# Patient Record
Sex: Female | Born: 1945
Health system: Southern US, Community
[De-identification: ages and names within clinical notes are randomized; demographics above are authoritative.]

## PROBLEM LIST (undated history)

## (undated) DIAGNOSIS — E785 Hyperlipidemia, unspecified: Secondary | ICD-10-CM

## (undated) DIAGNOSIS — T7840XA Allergy, unspecified, initial encounter: Secondary | ICD-10-CM

## (undated) DIAGNOSIS — I10 Essential (primary) hypertension: Secondary | ICD-10-CM

## (undated) DIAGNOSIS — D649 Anemia, unspecified: Secondary | ICD-10-CM

## (undated) HISTORY — DX: Hyperlipidemia, unspecified: E78.5

## (undated) HISTORY — DX: Allergy, unspecified, initial encounter: T78.40XA

## (undated) HISTORY — DX: Essential (primary) hypertension: I10

## (undated) HISTORY — PX: ABDOMINAL HYSTERECTOMY: SHX81

## (undated) HISTORY — DX: Anemia, unspecified: D64.9

## (undated) HISTORY — PX: PARTIAL HYSTERECTOMY: SHX80

## (undated) HISTORY — PX: TUBAL LIGATION: SHX77

---

## 2004-02-12 ENCOUNTER — Ambulatory Visit (HOSPITAL_COMMUNITY): Admission: RE | Admit: 2004-02-12 | Discharge: 2004-02-12 | Payer: Self-pay | Admitting: *Deleted

## 2006-08-25 ENCOUNTER — Encounter: Admission: RE | Admit: 2006-08-25 | Discharge: 2006-08-25 | Payer: Self-pay | Admitting: Family Medicine

## 2008-06-14 ENCOUNTER — Encounter: Admission: RE | Admit: 2008-06-14 | Discharge: 2008-06-14 | Payer: Self-pay | Admitting: Internal Medicine

## 2011-04-19 ENCOUNTER — Other Ambulatory Visit: Payer: Self-pay | Admitting: Family Medicine

## 2011-04-19 DIAGNOSIS — Z1231 Encounter for screening mammogram for malignant neoplasm of breast: Secondary | ICD-10-CM

## 2011-04-23 ENCOUNTER — Ambulatory Visit
Admission: RE | Admit: 2011-04-23 | Discharge: 2011-04-23 | Disposition: A | Discharge status: Home / Self Care | Payer: BC Managed Care – PPO | Source: Ambulatory Visit | Attending: Family Medicine | Admitting: Family Medicine

## 2011-04-23 DIAGNOSIS — Z1231 Encounter for screening mammogram for malignant neoplasm of breast: Secondary | ICD-10-CM

## 2012-03-09 ENCOUNTER — Ambulatory Visit (INDEPENDENT_AMBULATORY_CARE_PROVIDER_SITE_OTHER): Payer: BC Managed Care – PPO | Admitting: Emergency Medicine

## 2012-03-09 ENCOUNTER — Ambulatory Visit: Payer: BC Managed Care – PPO

## 2012-03-09 VITALS — BP 212/89 | HR 67 | Temp 98.0°F | Resp 16 | Ht 62.0 in | Wt 115.0 lb

## 2012-03-09 DIAGNOSIS — R269 Unspecified abnormalities of gait and mobility: Secondary | ICD-10-CM

## 2012-03-09 DIAGNOSIS — M542 Cervicalgia: Secondary | ICD-10-CM

## 2012-03-09 DIAGNOSIS — M19029 Primary osteoarthritis, unspecified elbow: Secondary | ICD-10-CM

## 2012-03-09 DIAGNOSIS — I10 Essential (primary) hypertension: Secondary | ICD-10-CM

## 2012-03-09 DIAGNOSIS — R42 Dizziness and giddiness: Secondary | ICD-10-CM

## 2012-03-09 LAB — POCT CBC
Granulocyte percent: 65.2 %G (ref 37–80)
HCT, POC: 40.8 % (ref 37.7–47.9)
Hemoglobin: 13.8 g/dL (ref 12.2–16.2)
MPV: 9.9 fL (ref 0–99.8)
POC Granulocyte: 3.9 (ref 2–6.9)
POC MID %: 5 %M (ref 0–12)
Platelet Count, POC: 228 10*3/uL (ref 142–424)
RBC: 4.56 M/uL (ref 4.04–5.48)
RDW, POC: 13.4 %
WBC: 6 10*3/uL (ref 4.6–10.2)

## 2012-03-09 LAB — COMPREHENSIVE METABOLIC PANEL
AST: 18 U/L (ref 0–37)
Alkaline Phosphatase: 70 U/L (ref 39–117)
Chloride: 106 mEq/L (ref 96–112)
Glucose, Bld: 102 mg/dL — ABNORMAL HIGH (ref 70–99)
Potassium: 4.2 mEq/L (ref 3.5–5.3)
Sodium: 144 mEq/L (ref 135–145)

## 2012-03-09 NOTE — Progress Notes (Signed)
Subjective:    Patient ID: Kristy [name], female    DOB: 1945/10/07, 66 y.o.   MRN: [id number]  HPI patient enters with onset on May 23 of dizziness she woke up this difficulty moving and a pressure sensation in her head. She  noticed at that time she had trouble drifting to the right she denies any focal weakness in arms or legs. She has a long history of hypertension. When she takes her blood pressure at home it is always normal however whenever she arrives here her systolics are always around 200 . Patient has also been having pain in the back of her neck and into both shoulders with pain on movement of her neck    Review of Systems     Objective:   Physical Exam  Constitutional: She is oriented to person, place, and time. She appears well-developed and well-nourished.  HENT:  Head: Normocephalic and atraumatic.  Eyes: Pupils are equal, round, and reactive to light.  Neck: No thyromegaly present.  Cardiovascular: Normal rate and regular rhythm.   Pulmonary/Chest: Effort normal and breath sounds normal.  Abdominal: Soft.  Neurological: She is alert and oriented to person, place, and time. She has normal reflexes. She displays normal reflexes. No cranial nerve deficit. Coordination normal.   Results for orders placed in visit on 03/09/12  POCT CBC      Component Value Range   WBC 5.9  4.6 - 10.2 K/uL   Lymph, poc 1.6  0.6 - 3.4   POC LYMPH PERCENT 27.9  10 - 50 %L   MID (cbc) 0.3  0 - 0.9   POC MID % 4.7  0 - 12 %M   POC Granulocyte 4.0  2 - 6.9   Granulocyte percent 67.4  37 - 80 %G   RBC 4.79  4.04 - 5.48 M/uL   Hemoglobin 6.2 (*) 12.2 - 16.2 g/dL   HCT, POC 95.6  21.3 - 47.9 %   MCV 91.2  80 - 97 fL   MCH, POC 12.9 (*) 27 - 31.2 pg   MCHC 14.2 (*) 31.8 - 35.4 g/dL   RDW, POC 08.6     Platelet Count, POC 246  142 - 424 K/uL   MPV 9.6  0 - 99.8 fL  GLUCOSE, POCT (MANUAL RESULT ENTRY)      Component Value Range   POC Glucose 93  70 - 99 mg/dl   [facility] reading (PRIMARY)  by  Dr.Kayson Bullis C4-C5 degenerative disc disease . Initial CBC is inaccurate. The repeat recorded test is accurate. Results for orders placed in visit on 03/09/12  POCT CBC      Component Value Range   WBC 5.9  4.6 - 10.2 K/uL   Lymph, poc 1.6  0.6 - 3.4   POC LYMPH PERCENT 27.9  10 - 50 %L   MID (cbc) 0.3  0 - 0.9   POC MID % 4.7  0 - 12 %M   POC Granulocyte 4.0  2 - 6.9   Granulocyte percent 67.4  37 - 80 %G   RBC 4.79  4.04 - 5.48 M/uL   Hemoglobin 6.2 (*) 12.2 - 16.2 g/dL   HCT, POC 57.8  46.9 - 47.9 %   MCV 91.2  80 - 97 fL   MCH, POC 12.9 (*) 27 - 31.2 pg   MCHC 14.2 (*) 31.8 - 35.4 g/dL   RDW, POC 62.9     Platelet Count, POC 246  142 - 424 K/uL  MPV 9.6  0 - 99.8 fL  GLUCOSE, POCT (MANUAL RESULT ENTRY)      Component Value Range   POC Glucose 93  70 - 99 mg/dl  This test is inaccurate Results for orders placed in visit on 03/09/12  POCT CBC      Component Value Range   WBC 6.0  4.6 - 10.2 K/uL   Lymph, poc 1.8  0.6 - 3.4   POC LYMPH PERCENT 29.8  10 - 50 %L   MID (cbc) 0.3  0 - 0.9   POC MID % 5.0  0 - 12 %M   POC Granulocyte 3.9  2 - 6.9   Granulocyte percent 65.2  37 - 80 %G   RBC 4.56  4.04 - 5.48 M/uL   Hemoglobin 13.8  12.2 - 16.2 g/dL   HCT, POC 40.9  81.1 - 47.9 %   MCV 89.4  80 - 97 fL   MCH, POC 30.3  27 - 31.2 pg   MCHC 33.8  31.8 - 35.4 g/dL   RDW, POC 91.4     Platelet Count, POC 228  142 - 424 K/uL   MPV 9.9  0 - 99.8 fL  GLUCOSE, POCT (MANUAL RESULT ENTRY)      Component Value Range   POC Glucose 93  70 - 99 mg/dl      Assessment & Plan:  Patient has cervical disc disease on x-ray. I suspect her dizziness is related to inner ear disease. Patient to take Tylenol for her neck pain. I gave her information on cervical disc disease and inner ear disease. We'll check an MRI to be sure we are not missing something  causing her dizziness and gait disturbance . He did mention this at diagnosis with disorientation however patient meant to say that she was  having episodes of dizziness and vertigo

## 2012-03-09 NOTE — Patient Instructions (Addendum)
Labyrinthitis (Inner Ear Inflammation) Your exam shows you have an inner ear disturbance or labyrinthitis. The cause of this condition is not known. But it may be due to a virus infection. The symptoms of labyrinthitis include vertigo or dizziness made worse by motion, nausea and vomiting. The onset of labyrinthitis may be very sudden. It usually lasts for a few days and then clears up over 1-2 weeks. The treatment of an inner ear disturbance includes bed rest and medications to reduce dizziness, nausea, and vomiting. You should stay away from alcohol, tranquilizers, caffeine, nicotine, or any medicine your doctor thinks may make your symptoms worse. Further testing may be needed to evaluate your hearing and balance system. Please see your doctor or go to the emergency room right away if you have:  Increasing vertigo, earache, loss of hearing, or ear drainage.   Headache, blurred vision, trouble walking, fainting, or fever.   Persistent vomiting, dehydration, or extreme weakness.  Document Released: 09/06/2005 Document Revised: 08/26/2011 Document Reviewed: 02/22/2007 [facility] Patient Information 2012 [place], [place].Cervical Radiculopathy Cervical radiculopathy happens when a nerve in the neck is pinched or bruised by a slipped (herniated) disk or by arthritic changes in the bones of the cervical spine. This can occur due to an injury or as part of the normal aging process. Pressure on the cervical nerves can cause pain or numbness that runs from your neck all the way down into your arm and fingers. CAUSES  There are many possible causes, including:  Injury.   Muscle tightness in the neck from overuse.   Swollen, painful joints (arthritis).   Breakdown or degeneration in the bones and joints of the spine (spondylosis) due to aging.   Bone spurs that may develop near the cervical nerves.  SYMPTOMS  Symptoms include pain, weakness, or numbness in the affected arm and hand. Pain can be severe  or irritating. Symptoms may be worse when extending or turning the neck. DIAGNOSIS  Your caregiver will ask about your symptoms and do a physical exam. He or she may test your strength and reflexes. X-rays, CT scans, and MRI scans may be needed in cases of injury or if the symptoms do not go away after a period of time. Electromyography (EMG) or nerve conduction testing may be done to study how your nerves and muscles are working. TREATMENT  Your caregiver may recommend certain exercises to help relieve your symptoms. Cervical radiculopathy can, and often does, get better with time and treatment. If your problems continue, treatment options may include:  Wearing a soft collar for short periods of time.   Physical therapy to strengthen the neck muscles.   Medicines, such as nonsteroidal anti-inflammatory drugs (NSAIDs), oral corticosteroids, or spinal injections.   Surgery. Different types of surgery may be done depending on the cause of your problems.  HOME CARE INSTRUCTIONS   Put ice on the affected area.   Put ice in a plastic bag.   Place a towel between your skin and the bag.   Leave the ice on for 15 to 20 minutes, 3 to 4 times a day or as directed by your caregiver.   Use a flat pillow when you sleep.   Only take over-the-counter or prescription medicines for pain, discomfort, or fever as directed by your caregiver.   If physical therapy was prescribed, follow your caregiver's directions.   If a soft collar was prescribed, use it as directed.  SEEK IMMEDIATE MEDICAL CARE IF:   Your pain gets much worse and  cannot be controlled with medicines.   You have weakness or numbness in your hand, arm, face, or leg.   You have a high fever or a stiff, rigid neck.   You lose bowel or bladder control (incontinence).   You have trouble with walking, balance, or speaking.  MAKE SURE YOU:   Understand these instructions.   Will watch your condition.   Will get help right away if  you are not doing well or get worse.  Document Released: 06/01/2001 Document Revised: 08/26/2011 Document Reviewed: 04/20/2011 [facility] Patient Information 2012 [place], [place].Marland Kitchen

## 2012-03-18 ENCOUNTER — Other Ambulatory Visit: Payer: BC Managed Care – PPO

## 2012-03-19 ENCOUNTER — Other Ambulatory Visit: Payer: Self-pay | Admitting: Physician Assistant

## 2012-03-20 ENCOUNTER — Other Ambulatory Visit: Payer: Self-pay | Admitting: *Deleted

## 2012-03-21 MED ORDER — SIMVASTATIN 40 MG PO TABS: 40.0000 mg | ORAL_TABLET | Freq: Every day | ORAL | Status: DC

## 2012-03-22 ENCOUNTER — Other Ambulatory Visit: Payer: Self-pay | Admitting: Physician Assistant

## 2012-03-25 ENCOUNTER — Ambulatory Visit
Admission: RE | Admit: 2012-03-25 | Discharge: 2012-03-25 | Disposition: A | Discharge status: Home / Self Care | Payer: BC Managed Care – PPO | Source: Ambulatory Visit | Attending: Emergency Medicine | Admitting: Emergency Medicine

## 2012-03-25 DIAGNOSIS — R42 Dizziness and giddiness: Secondary | ICD-10-CM

## 2012-03-31 ENCOUNTER — Encounter: Payer: Self-pay | Admitting: Family Medicine

## 2012-03-31 ENCOUNTER — Ambulatory Visit (INDEPENDENT_AMBULATORY_CARE_PROVIDER_SITE_OTHER): Payer: BC Managed Care – PPO | Admitting: Family Medicine

## 2012-03-31 VITALS — BP 130/84 | HR 66 | Temp 97.8°F | Resp 16 | Ht 64.0 in | Wt 115.8 lb

## 2012-03-31 DIAGNOSIS — Z1211 Encounter for screening for malignant neoplasm of colon: Secondary | ICD-10-CM

## 2012-03-31 DIAGNOSIS — E7800 Pure hypercholesterolemia, unspecified: Secondary | ICD-10-CM

## 2012-03-31 DIAGNOSIS — Z1212 Encounter for screening for malignant neoplasm of rectum: Secondary | ICD-10-CM

## 2012-03-31 DIAGNOSIS — Z0000 Encounter for general adult medical examination without abnormal findings: Secondary | ICD-10-CM

## 2012-03-31 DIAGNOSIS — Z23 Encounter for immunization: Secondary | ICD-10-CM

## 2012-03-31 DIAGNOSIS — I10 Essential (primary) hypertension: Secondary | ICD-10-CM

## 2012-03-31 LAB — POCT URINALYSIS DIPSTICK
Glucose, UA: NEGATIVE
Ketones, UA: NEGATIVE
Spec Grav, UA: >=1.03
Urobilinogen, UA: 0.2

## 2012-03-31 LAB — LIPID PANEL
Cholesterol: 208 mg/dL — ABNORMAL HIGH (ref 0–200)
Total CHOL/HDL Ratio: 2.6 Ratio
Triglycerides: 47 mg/dL (ref <150)
VLDL: 9 mg/dL (ref 0–40)

## 2012-03-31 LAB — POCT UA - MICROSCOPIC ONLY: Crystals, Ur, HPF, POC: NEGATIVE

## 2012-03-31 MED ORDER — ATENOLOL 25 MG PO TABS: 25.0000 mg | ORAL_TABLET | Freq: Every day | ORAL | Status: DC

## 2012-03-31 MED ORDER — SIMVASTATIN 40 MG PO TABS: 40.0000 mg | ORAL_TABLET | Freq: Every day | ORAL | Status: DC

## 2012-03-31 NOTE — Patient Instructions (Addendum)

## 2012-03-31 NOTE — Progress Notes (Signed)
Subjective:    Patient ID: Kristy [name], female    DOB: 1946-07-02, 66 y.o.   MRN: [id number]  HPI  This 66 y.o. AA female is here for CPE and wellness review. She has documented "White-coat HTN";  BP measurements at home: 84-160/ 51-95. She is completely asymptomatic.    She was evaluated on 03/09/12 for vertigo and diagnosis statement included "Altered Mental Status";  pt requests that this been corrected as she did not have any alteration of cognition or mental status.   The pt is a divorced, retired Programmer, systems, does not smoke nor consume alcohol and walks daily for exercise.   Last PAP: > 3 years ago  Last MMG: 04/23/11 (normal)  Last DEXA: 05/2008 ("low bone mass")  Colonoscopy: never   Review of Systems  Constitutional: Negative.   HENT: Positive for neck pain and tinnitus.   Eyes: Negative.   Respiratory: Negative.   Cardiovascular: Negative.   Gastrointestinal: Negative.   Genitourinary: Negative.   Skin: Negative.   Neurological: Positive for dizziness, light-headedness and numbness.  Hematological: Negative.   Psychiatric/Behavioral: Negative.        Objective:   Physical Exam  Nursing note and vitals reviewed. Constitutional: She is oriented to person, place, and time. She appears well-developed and well-nourished. No distress.  HENT:  Head: Normocephalic and atraumatic.  Right Ear: Hearing, tympanic membrane and external ear normal.  Left Ear: Hearing, tympanic membrane, external ear and ear canal normal.  Nose: Nose normal. No mucosal edema, nasal deformity or septal deviation.  Mouth/Throat: Uvula is midline, oropharynx is clear and moist and mucous membranes are normal. Mucous membranes are not pale and not dry. No oral lesions. Normal dentition. No oropharyngeal exudate or posterior oropharyngeal erythema.  Eyes: Conjunctivae, EOM and lids are normal. Pupils are equal, round, and reactive to light. Right conjunctiva is not injected. Left conjunctiva is not  injected. No scleral icterus.       Fundoscopic exam -difficult to assess but pt has exams by eye professional  Neck: Normal range of motion. Neck supple. No JVD present. No thyromegaly present.  Cardiovascular: Normal rate, regular rhythm, normal heart sounds and intact distal pulses.  Exam reveals no gallop.   No murmur heard. Pulmonary/Chest: Effort normal and breath sounds normal. No respiratory distress. She has no wheezes. She has no rales. Right breast exhibits no inverted nipple, no mass, no nipple discharge and no skin change. Left breast exhibits no inverted nipple, no mass, no nipple discharge, no skin change and no tenderness. Breasts are symmetrical.  Abdominal: Soft. Bowel sounds are normal. She exhibits no abdominal bruit, no pulsatile midline mass and no mass. There is no tenderness. There is no guarding and no CVA tenderness. Hernia confirmed negative in the ventral area.  Genitourinary: Rectum normal. Rectal exam shows no external hemorrhoid, no fissure, no mass, no tenderness and anal tone normal. Guaiac negative stool.       GYN exam deferred per pt preference  Musculoskeletal: Normal range of motion. She exhibits no edema and no tenderness.  Lymphadenopathy:    She has no cervical adenopathy.  Neurological: She is alert and oriented to person, place, and time. She has normal reflexes. No cranial nerve deficit. She exhibits normal muscle tone. Coordination normal.  Skin: Skin is warm and dry. No rash noted. No erythema.  Psychiatric: She has a normal mood and affect. Her behavior is normal. Judgment and thought content normal.    ECG: Sinus bradycardia; otherwise normal  Assessment & Plan:   1. Routine general medical examination at a health care facility  EKG 12-Lead, Vitamin D, 25-hydroxy Urinalysis- negative  2. Hypercholesteremia  Lipid panel Continue Simvastatin  3. HTN, goal below 140/80 - BP recheck shows normal reading EKG 12-Lead Continue Atenolol   4.  Need for prophylactic vaccination with combined diphtheria-tetanus-pertussis (DTP) vaccine    5. Screening for colorectal cancer  Ambulatory referral to Gastroenterology (pt requests [facility])

## 2012-04-05 ENCOUNTER — Encounter: Payer: Self-pay | Admitting: Radiology

## 2012-04-05 ENCOUNTER — Other Ambulatory Visit: Payer: Self-pay | Admitting: Family Medicine

## 2012-04-05 ENCOUNTER — Encounter: Payer: Self-pay | Admitting: Family Medicine

## 2012-04-05 DIAGNOSIS — I10 Essential (primary) hypertension: Secondary | ICD-10-CM | POA: Insufficient documentation

## 2012-04-05 DIAGNOSIS — E7800 Pure hypercholesterolemia, unspecified: Secondary | ICD-10-CM | POA: Insufficient documentation

## 2012-04-05 DIAGNOSIS — E785 Hyperlipidemia, unspecified: Secondary | ICD-10-CM | POA: Insufficient documentation

## 2012-04-05 MED ORDER — SIMVASTATIN 40 MG PO TABS: 40.0000 mg | ORAL_TABLET | Freq: Every day | ORAL | Status: DC

## 2012-04-05 NOTE — Progress Notes (Signed)
Quick Note:  Please call pt and advise that the following labs are abnormal... Vitamin D level is too low. Get over-the-counter Vitamin D3 supplement 1000 IU and take 1 capsule daily. Also you have had "low bone mass" on bone density study in the past so you should also get Citracal + D3 and take 1 tablet twice a day. Both of these supplements will improve overall health.  Cholesterol number are still a little high. Stay on your current medications and try to eat as healthy as you can. Limit processed foods/ junk foods and avoid fried foods, Keep walking every day for good health.  Copy to pt. ______

## 2012-08-11 ENCOUNTER — Ambulatory Visit: Payer: BC Managed Care – PPO

## 2012-08-11 ENCOUNTER — Encounter: Payer: Self-pay | Admitting: Family Medicine

## 2012-08-11 ENCOUNTER — Ambulatory Visit (INDEPENDENT_AMBULATORY_CARE_PROVIDER_SITE_OTHER): Payer: BC Managed Care – PPO | Admitting: Family Medicine

## 2012-08-11 VITALS — BP 140/90 | HR 64 | Temp 98.5°F | Resp 16 | Ht 62.5 in | Wt 112.8 lb

## 2012-08-11 DIAGNOSIS — M25519 Pain in unspecified shoulder: Secondary | ICD-10-CM

## 2012-08-11 DIAGNOSIS — M25512 Pain in left shoulder: Secondary | ICD-10-CM

## 2012-08-11 MED ORDER — MELOXICAM 7.5 MG PO TABS: ORAL_TABLET | ORAL | Status: DC

## 2012-08-11 NOTE — Progress Notes (Signed)
S;; This delightful 66 y.o. AA female was helping her daughter move about 4 weeks ago and strained her left shoulder. She thought it would improve with rest, Tylenol 500 mg and heat. Pain and decreased ROM have persisted.She cannot lift her arm above shoulder level. Pt denies numbness or weakness. Pain does not interfere with sleep.  ROS: As per HPI.   O: Filed Vitals:   08/11/12 1129  BP: 140/90  Pulse: 64  Temp: 98.5 F (36.9 C)  Resp: 16   GEN: In NAD; WN,WD. HENT: [place]/AT; EOMI otherwise unremarkable. NECK: Supple w/ normal ROM. MS: L shoulder- mildly tender anterior aspect / head of biceps. Can abduct arm to 90 degrees. Passive ROM limited to 90 degrees.        R shoulder- normal,NT, FROM. NEURO: A&O x 3; CNs intact. Nonfocal.   [facility] reading (PRIMARY) by  Dr. Audria Nine: L shoulder- no fracture or dislocation; normal joint space    A/P:  1. Shoulder pain, left  DG Shoulder Left  RX: Meloxicam 7.5 mg 1 tablet bid w/ food or snack. Pt declines PT referral at this time. She will continue conservative Tx and RTC for f/u in 4 weeks.

## 2012-08-11 NOTE — Patient Instructions (Signed)
Adhesive Capsulitis Sometimes the shoulder becomes stiff and is painful to move. Some people say it feels as if the shoulder is frozen in place. Because of this, the condition is called "frozen shoulder." Its medical name is adhesive capsulitis.  The shoulder joint is made up of strong connective tissue that attaches the ball of the humerus to the shallow shoulder socket. This strong connective tissue is called the joint capsule. This tissue can become stiff and swollen. That is when adhesive capsulitis sets in. CAUSES  It is not always clear just what the cause adhesive capsulitis. Possibilities include:  Injury to the shoulder joint.  Strain. This is a repetitive injury brought about by overuse.  Lack of use. Perhaps your arm or hand was otherwise injured. It might have been in a sling for awhile. Or perhaps you were not using it to avoid pain.  Referred pain. This is a sort of trick the body plays. You feel pain in the shoulder. But, the pain actually comes from an injury somewhere else in the body.  Long-standing health problems. Several diseases can cause adhesive capsulitis. They include diabetes, heart disease, stroke, thyroid problems, rheumatoid arthritis and lung disease.  Being a women older than 40. Anyone can develop adhesive capsulitis but it is most common in women in this age group. SYMPTOMS   Pain.  It occurs when the arm is moved.  Parts of the shoulder might hurt if they are touched.  Pain is worse at night or when resting.  Soreness. It might not be strong enough to be called pain. But, the shoulder aches.  The shoulder does not move freely.  Muscle spasms.  Trouble sleeping because of shoulder ache or pain. DIAGNOSIS  To decide if you have adhesive capsulitis, your healthcare provider will probably:  Ask about symptoms you have noticed.  Ask about your history of joint pain and anything that might have caused the pain.  Ask about your overall  health.  Use hands to feel your shoulder and neck.  Ask you to move your shoulder in specific directions. This may indicate the origin of the pain.  Order imaging tests; pictures of the shoulder. They help pinpoint the source of the problem. An X-ray might be used. For more detail, an MRI is often used. An MRI details the tendons, muscles and ligaments as well as the joint. TREATMENT  Adhesive capsulitis can be treated several ways. Most treatments can be done in a clinic or in your healthcare provider's office. Be sure to discuss the different options with your caregiver. They include:  Physical therapy. You will work on specific exercises to get your shoulder moving again. The exercises usually involve stretching. A physical therapist (a caregiver with special training) can show you what to do and what not to do. The exercises will need to be done daily.  Medication.  Over-the-counter medicines may relieve pain and inflammation (the body's way of reacting to injury or infection).  Corticosteroids. These are stronger drugs to reduce pain and inflammation. They are given by injection (shots) into the shoulder joint. Frequent treatment is not recommended.  Muscle relaxants. Medication may be prescribed to ease muscle spasms.  Treatment of underlying conditions. This means treating another condition that is causing your shoulder problem. This might be a rotator cuff (tendon) problem  Shoulder manipulation. The shoulder will be moved by your healthcare provider. You would be under general anesthesia (given a drug that puts you to sleep). You would not feel anything. Sometimes   the joint will be injected with salt water (saline) at high pressure to break down internal scarring in the joint capsule.  Surgery. This is rarely needed. It may be suggested in advanced cases after all other treatment has failed. PROGNOSIS  In time, most people recover from adhesive capsulitis. Sometimes, however, the  pain goes away but full movement of the shoulder does not return.  HOME CARE INSTRUCTIONS   Take any pain medications recommended by your healthcare provider. Follow the directions carefully.  If you have physical therapy, follow through with the therapist's suggestions. Be sure you understand the exercises you will be doing. You should understand:  How often the exercises should be done.  How many times each exercise should be repeated.  How long they should be done.  What other activities you should do, or not do.  That you should warm up before doing any exercise. Just 5 to 10 minutes will help. Small, gentle movements should get your shoulder ready for more.  Avoid high-demand exercise that involves your shoulder such as throwing. This type of exercise can make pain worse.  Consider using cold packs. Cold may ease swelling and pain. Ask your healthcare provider if a cold pack might help you. If so, get directions on how and when to use them. SEEK MEDICAL CARE IF:   You have any questions about your medications.  Your pain continues to increase. Document Released: 07/04/2009 Document Revised: 11/29/2011 Document Reviewed: 07/04/2009 [employer] Patient Information 2013 [employer].  

## 2012-09-08 ENCOUNTER — Encounter: Payer: Self-pay | Admitting: Family Medicine

## 2012-09-08 ENCOUNTER — Ambulatory Visit (INDEPENDENT_AMBULATORY_CARE_PROVIDER_SITE_OTHER): Payer: BC Managed Care – PPO | Admitting: Family Medicine

## 2012-09-08 VITALS — BP 192/90 | HR 61 | Temp 97.7°F | Resp 16 | Ht 64.0 in | Wt 116.0 lb

## 2012-09-08 DIAGNOSIS — M25512 Pain in left shoulder: Secondary | ICD-10-CM

## 2012-09-08 DIAGNOSIS — T148 Other injury of unspecified body region: Secondary | ICD-10-CM

## 2012-09-08 NOTE — Patient Instructions (Signed)
For shoulder pain, you can use Arnica Gel - apply this to shoulder joint or any painful areas 2-3 times daily.

## 2012-09-14 ENCOUNTER — Encounter: Payer: Self-pay | Admitting: Family Medicine

## 2012-09-14 NOTE — Progress Notes (Signed)
S:  This 66 y.o. AA female returns for follow-up of shoulder pain; pain is almost completely resolved and mobility is greatly improved. Pt has been doing home exercises to increase ROM. She denies swelling, effusion or crepitus. There is no radicular pain or loss of use; pt denies numbness or paresthesias.  ROS: As per HPI.  O:  Filed Vitals:   09/08/12 0837  BP: 192/90                                              November reading: 140/90  Pulse: 61  Temp: 97.7 F (36.5 C)  Resp: 16   GEN: In NAD; WN,WD. HENT: [place]/AT. Otherwise unremarkable. NECK: Supple w/o tenderness or stiffness. MS: Right shoulder- FROM. Left shoulder- abduction/ internal rotation slightly limited. No point tenderness. NEURO: A&O x 3; CNs intact. Motor- grip is 4+/= in both hands.  A/P: 1. Shoulder pain, left           Resolving; continue current treatment and self- management.

## 2013-02-23 ENCOUNTER — Encounter: Payer: Self-pay | Admitting: Family Medicine

## 2013-02-23 ENCOUNTER — Ambulatory Visit (INDEPENDENT_AMBULATORY_CARE_PROVIDER_SITE_OTHER): Payer: Medicare Other | Admitting: Family Medicine

## 2013-02-23 VITALS — BP 140/98 | HR 69 | Temp 97.8°F | Resp 16 | Ht 62.5 in | Wt 113.0 lb

## 2013-02-23 DIAGNOSIS — I10 Essential (primary) hypertension: Secondary | ICD-10-CM | POA: Diagnosis not present

## 2013-02-23 DIAGNOSIS — IMO0001 Reserved for inherently not codable concepts without codable children: Secondary | ICD-10-CM

## 2013-02-23 DIAGNOSIS — IMO0002 Reserved for concepts with insufficient information to code with codable children: Secondary | ICD-10-CM | POA: Diagnosis not present

## 2013-02-23 DIAGNOSIS — S29011A Strain of muscle and tendon of front wall of thorax, initial encounter: Secondary | ICD-10-CM

## 2013-02-23 NOTE — Patient Instructions (Signed)
Muscle Strain A muscle strain (pulled muscle) happens when a muscle is stretched beyond normal length. Usually, a few of the fibers in your muscle are torn. Muscle strain is common in athletes. It happens when a sudden, violent force stretches your muscle too far. Recovery usually takes 1 2 weeks. Complete healing takes 5 6 weeks.  HOME CARE   Put ice on the sore muscle for the first 2 days after the injury.  Put ice in a plastic bag.  Place a towel between your skin and the bag.  Leave the ice on for 15-20 minutes at a time each hour.  Do not use the muscle if you have pain. Do not stress the muscle if you have pain.  Wrap the injured area with an elastic bandage for comfort. Do not put it on too tightly.  Only take medicine as told by your doctor. GET HELP RIGHT AWAY IF:  There is increased pain or puffiness (swelling) in the affected area. MAKE SURE YOU:   Understand these instructions.  Will watch your condition.  Will get help right away if you are not doing well or get worse. Document Released: 06/15/2008 Document Revised: 05/31/2012 Document Reviewed: 03/20/2012 [facility] Patient Information 2014 [place], [place].   Avoid heavy lifting or moving heavy furniture,etc on your own. Continue current medications and usual activities within reason.

## 2013-02-23 NOTE — Progress Notes (Signed)
S:  This 67 y.o. AA female has HTN, controlled according to readings at home (145-160/80-90). She has white-coat HTN and always has elevated BP when in office. Pt is compliant w/ medications and reports no adverse effects. Pt now has MCR and has a "[facility]" visit scheduled for August 2014.   Pt is here today w/ complaint of L anterior chest pain since mid-April; she was moving a piece of furniture w/o assistance and felt a pull in her chest near the sternum. It is not better or worse w/ any movements. She is concerned because it has not completely resolved. She denies diaphoresis, fever, CP or tightness, palpitations, SOB or DOE, cough, n/v/d, other myalgias, numbness, weakness or syncope.  Patient Active Problem List   Diagnosis Date Noted  . Hypercholesteremia 04/05/2012  . HTN, goal below 140/80 04/05/2012     PMHx, Soc Hx and Fam Hx reviewed.  O: Filed Vitals:   02/23/13 0855 02/23/13 0924  BP: 190/91 140/98  Pulse: 69   Temp: 97.8 F (36.6 C)   TempSrc: Oral   Resp: 16   Height: 5' 2.5" (1.588 m)   Weight: 113 lb (51.256 kg)    GEN: In NAD: WN,WD. HENT: [place]/AT; EOMI w/ clear conj/sclerae. EACs/nose/oroph normal. COR: RRR. No m/g/r. LUNGS: CTA> MS: L anterior lower rib margin- tender with moderate palpation. No defect or deformity. Pt can move arms and torso w/o increased pain. ABD- Soft, flat, NT w/o guarding; no masses or HSM. NEURO: A&O x 3; CNs intact. Nonfocal.  A/P: Muscle strain of chest wall, initial encounter- reassurance; symptomatic treatment. Avoid overexertion.  HTN, goal below 150/90- continue BP readings at home; no change of medications.  HTN, white coat  RTC in August for [facility] visit and PE.

## 2013-04-03 ENCOUNTER — Other Ambulatory Visit: Payer: Self-pay | Admitting: Family Medicine

## 2013-05-03 ENCOUNTER — Ambulatory Visit (INDEPENDENT_AMBULATORY_CARE_PROVIDER_SITE_OTHER): Payer: Medicare Other | Admitting: Family Medicine

## 2013-05-03 ENCOUNTER — Encounter: Payer: Self-pay | Admitting: Family Medicine

## 2013-05-03 VITALS — BP 168/98 | HR 85 | Temp 97.8°F | Resp 16 | Ht 62.0 in | Wt 115.6 lb

## 2013-05-03 DIAGNOSIS — Z23 Encounter for immunization: Secondary | ICD-10-CM | POA: Diagnosis not present

## 2013-05-03 DIAGNOSIS — I10 Essential (primary) hypertension: Secondary | ICD-10-CM | POA: Diagnosis not present

## 2013-05-03 DIAGNOSIS — Z0000 Encounter for general adult medical examination without abnormal findings: Secondary | ICD-10-CM | POA: Diagnosis not present

## 2013-05-03 DIAGNOSIS — Z1231 Encounter for screening mammogram for malignant neoplasm of breast: Secondary | ICD-10-CM

## 2013-05-03 DIAGNOSIS — E7800 Pure hypercholesterolemia, unspecified: Secondary | ICD-10-CM | POA: Diagnosis not present

## 2013-05-03 LAB — POCT UA - MICROSCOPIC ONLY
Crystals, Ur, HPF, POC: NEGATIVE
Yeast, UA: NEGATIVE

## 2013-05-03 LAB — POCT URINALYSIS DIPSTICK
Bilirubin, UA: NEGATIVE
Glucose, UA: NEGATIVE
Spec Grav, UA: 1.02

## 2013-05-03 MED ORDER — ATENOLOL 25 MG PO TABS: ORAL_TABLET | ORAL | Status: DC

## 2013-05-03 MED ORDER — SIMVASTATIN 40 MG PO TABS: ORAL_TABLET | ORAL | Status: DC

## 2013-05-03 NOTE — Patient Instructions (Signed)
Keeping You Healthy  Get These Tests  Blood Pressure- Have your blood pressure checked by your healthcare provider at least once a year.  Normal blood pressure is 120/80.  Weight- Have your body mass index (BMI) calculated to screen for obesity.  BMI is a measure of body fat based on height and weight.  You can calculate your own BMI at https://www.west-esparza.com/  Cholesterol- Have your cholesterol checked every year.  Diabetes- Have your blood sugar checked every year if you have high blood pressure, high cholesterol, a family history of diabetes or if you are overweight.  Pap Smear- Have a pap smear every 1 to 3 years if you have been sexually active.  If you are older than 65 and recent pap smears have been normal you may not need additional pap smears.  In addition, if you have had a hysterectomy  For benign disease additional pap smears are not necessary.  Mammogram-Yearly mammograms are essential for early detection of breast cancer  Screening for Colon Cancer- Colonoscopy starting at age 96. Screening may begin sooner depending on your family history and other health conditions.  Follow up colonoscopy as directed by your Gastroenterologist.  Screening for Osteoporosis- Screening begins at age 64 with bone density scanning, sooner if you are at higher risk for developing Osteoporosis.  Get these medicines  Calcium with Vitamin D- Your body requires 1200-1500 mg of Calcium a day and [phone] IU of Vitamin D a day.  You can only absorb 500 mg of Calcium at a time therefore Calcium must be taken in 2 or 3 separate doses throughout the day.  Hormones- Hormone therapy has been associated with increased risk for certain cancers and heart disease.  Talk to your healthcare provider about if you need relief from menopausal symptoms.  Aspirin- Ask your healthcare provider about taking Aspirin to prevent Heart Disease and Stroke.  Get these Immuniztions  Flu shot- Every fall  Pneumonia  shot- Once after the age of 71; if you are younger ask your healthcare provider if you need a pneumonia shot. You received this vaccine today.  Tetanus- Every ten years. You received this vaccine in 2013.  Zostavax- Once after the age of 69 to prevent shingles. This vaccine is current.  Take these steps  Don't smoke- Your healthcare provider can help you quit. For tips on how to quit, ask your healthcare provider or go to www.smokefree.gov or call 1-800 QUIT-NOW.  Be physically active- Exercise 5 days a week for a minimum of 30 minutes.  If you are not already physically active, start slow and gradually work up to 30 minutes of moderate physical activity.  Try walking, dancing, bike riding, swimming, etc.  Eat a healthy diet- Eat a variety of healthy foods such as fruits, vegetables, whole grains, low fat milk, low fat cheeses, yogurt, lean meats, chicken, fish, eggs, dried beans, tofu, etc.  For more information go to www.thenutritionsource.org  Dental visit- Brush and floss teeth twice daily; visit your dentist twice a year.  Eye exam- Visit your Optometrist or Ophthalmologist yearly.  Drink alcohol in moderation- Limit alcohol intake to one drink or less a day.  Never drink and drive.  Depression- Your emotional health is as important as your physical health.  If you're feeling down or losing interest in things you normally enjoy, please talk to your healthcare provider.  Seat Belts- can save your life; always wear one  Smoke/Carbon Monoxide detectors- These detectors need to be installed on the appropriate level  of your home.  Replace batteries at least once a year.  Violence- If anyone is threatening or hurting you, please tell your healthcare provider.  Living Will/ Health care power of attorney- Discuss with your healthcare provider and family.

## 2013-05-03 NOTE — Progress Notes (Signed)
Subjective:    Patient ID: Kristy [name], female    DOB: 01-26-1946, 67 y.o.   MRN: [id number]  HPI  This 67 y.o. AA female is here for [facility] to [facility] exam. She is retired Programmer, systems. Pt is  compliant with medications for HTN and lipid disorder; no reports of adverse effects. BP readings  at home: SBP= 130-150.  Pt walks 2 miles most days of the week.  Patient Active Problem List   Diagnosis Date Noted  . Hypercholesteremia 04/05/2012  . HTN, goal below 140/80 04/05/2012   HCM: MMG- 20112 (negative).           IMM- needs Pneumococcal vaccine.           CRS- 2013 (normal).           DEXA- (normal- pt reports but not validated).  PMHx, Soc Hx and Fam Hx reviewed. Medications reconciled.   Review of Systems  Constitutional: Negative.   HENT: Positive for tinnitus.        "minimal"  Eyes:       Dry eyes.  Neurological: Positive for light-headedness.       "ocassional"  Psychiatric/Behavioral:       HANDS Questionnaire for Depression Screening- all answers "none".  All other systems reviewed and are negative.       Objective:   Physical Exam  Nursing note and vitals reviewed. Constitutional: She is oriented to person, place, and time. Vital signs are normal. She appears well-developed and well-nourished. No distress.  HENT:  Head: Normocephalic and atraumatic.  Right Ear: Hearing, tympanic membrane, external ear and ear canal normal.  Left Ear: Hearing, tympanic membrane, external ear and ear canal normal.  Nose: Nose normal. No nasal deformity or septal deviation.  Mouth/Throat: Uvula is midline and oropharynx is clear and moist. No oral lesions. Normal dentition. No dental caries.  Eyes: Conjunctivae, EOM and lids are normal. Pupils are equal, round, and reactive to light. No scleral icterus.  Annual vision eval w/ professional ; pt wears corrective lenses.  Neck: Normal range of motion and full passive range of motion without pain. Neck supple. No JVD present. No spinous  process tenderness and no muscular tenderness present. Carotid bruit is not present. No rigidity. Normal range of motion present. No mass and no thyromegaly present.  Cardiovascular: Normal rate, regular rhythm, S1 normal, S2 normal, normal heart sounds and normal pulses.  Exam reveals no gallop.   No murmur heard. Pulmonary/Chest: Effort normal and breath sounds normal. No respiratory distress. She has no wheezes. She exhibits no tenderness, no bony tenderness and no deformity. Right breast exhibits no inverted nipple, no mass, no nipple discharge, no skin change and no tenderness. Left breast exhibits no inverted nipple, no mass, no nipple discharge, no skin change and no tenderness. Breasts are symmetrical.  Abdominal: Soft. Normal appearance and bowel sounds are normal. She exhibits no distension, no abdominal bruit, no pulsatile midline mass and no mass. There is no hepatosplenomegaly. There is no tenderness. There is no guarding and no CVA tenderness. No hernia.  Genitourinary:  Deferred.  Musculoskeletal: Normal range of motion. She exhibits no edema and no tenderness.  Lymphadenopathy:       Head (right side): No submental, no submandibular, no tonsillar, no preauricular, no posterior auricular and no occipital adenopathy present.       Head (left side): No submental, no submandibular, no tonsillar, no preauricular, no posterior auricular and no occipital adenopathy present.    She has no  cervical adenopathy.    She has no axillary adenopathy.       Right: No inguinal and no supraclavicular adenopathy present.       Left: No inguinal and no supraclavicular adenopathy present.  Neurological: She is alert and oriented to person, place, and time. She has normal strength and normal reflexes. She displays no atrophy. No cranial nerve deficit or sensory deficit. She exhibits normal muscle tone. Coordination and gait normal.  Skin: Skin is warm, dry and intact. No bruising and no rash noted. She is  not diaphoretic. No cyanosis. Nails show no clubbing.  Psychiatric: She has a normal mood and affect. Her speech is normal and behavior is normal. Judgment and thought content normal. Cognition and memory are normal.    ECG: NSR (bradycardia); no ST-TW changes or ectopy.   Results for orders placed in visit on 05/03/13  POCT URINALYSIS DIPSTICK      Result Value Range   Color, UA yellow     Clarity, UA clear     Glucose, UA neg     Bilirubin, UA neg     Ketones, UA 15 mg     Spec Grav, UA 1.020     Blood, UA moderate     pH, UA 5.0     Protein, UA neg     Urobilinogen, UA 0.2     Nitrite, UA neg     Leukocytes, UA Trace    POCT UA - MICROSCOPIC ONLY      Result Value Range   WBC, Ur, HPF, POC 0-4     RBC, urine, microscopic 0-6     Bacteria, U Microscopic trace     Mucus, UA small     Epithelial cells, urine per micros 1-12     Crystals, Ur, HPF, POC neg     Casts, Ur, LPF, POC neg     Yeast, UA neg         Assessment & Plan:  Routine general medical examination at a health care facility - Plan: POCT urinalysis dipstick, POCT UA - Microscopic Only, MM Digital Screening, Comprehensive metabolic panel, EKG 12-Lead  HTN (hypertension)- Stable; continue current medications and lifestyle practices.  Pure hypercholesterolemia - Plan: Lipid panel  Need for prophylactic vaccination against Streptococcus pneumoniae (pneumococcus) - Plan: Pneumococcal polysaccharide vaccine 23-valent greater than or equal to 2yo subcutaneous/IM  Other screening mammogram - Plan: MM Digital Screening  Meds ordered this encounter  Medications  . atenolol (TENORMIN) 25 MG tablet    Sig: TAKE 1 TABLET (25 MG TOTAL) BY MOUTH DAILY.    Dispense:  90 tablet    Refill:  3  . simvastatin (ZOCOR) 40 MG tablet    Sig: TAKE 1 TABLET AT BEDTIME    Dispense:  90 tablet    Refill:  3

## 2013-05-04 LAB — COMPREHENSIVE METABOLIC PANEL
ALT: 17 U/L (ref 0–35)
AST: 22 U/L (ref 0–37)
CO2: 27 mEq/L (ref 19–32)
Calcium: 10.3 mg/dL (ref 8.4–10.5)
Chloride: 105 mEq/L (ref 96–112)
Creat: 0.74 mg/dL (ref 0.50–1.10)
Sodium: 142 mEq/L (ref 135–145)
Total Bilirubin: 0.9 mg/dL (ref 0.3–1.2)
Total Protein: 7.7 g/dL (ref 6.0–8.3)

## 2013-05-04 LAB — LIPID PANEL
Cholesterol: 214 mg/dL — ABNORMAL HIGH (ref 0–200)
Total CHOL/HDL Ratio: 2.2 Ratio
Triglycerides: 46 mg/dL (ref <150)
VLDL: 9 mg/dL (ref 0–40)

## 2013-05-04 NOTE — Progress Notes (Signed)
Quick Note:  Please advise pt that the following labs are abnormal... Lipids are about the same as 1 year ago but LDL ("bad") cholesterol is lower. All other labs (electrolytes, blood sugar, kidney and liver tests) are normal.  Continue current medications.  Copy to pt. ______

## 2013-05-30 ENCOUNTER — Encounter: Payer: Self-pay | Admitting: Family Medicine

## 2013-05-30 ENCOUNTER — Ambulatory Visit (INDEPENDENT_AMBULATORY_CARE_PROVIDER_SITE_OTHER): Payer: Medicare Other | Admitting: Family Medicine

## 2013-05-30 ENCOUNTER — Ambulatory Visit: Payer: Medicare Other

## 2013-05-30 VITALS — BP 200/90 | HR 66 | Temp 98.1°F | Resp 16 | Ht 62.5 in | Wt 115.0 lb

## 2013-05-30 DIAGNOSIS — R0781 Pleurodynia: Secondary | ICD-10-CM

## 2013-05-30 DIAGNOSIS — R079 Chest pain, unspecified: Secondary | ICD-10-CM | POA: Diagnosis not present

## 2013-05-30 DIAGNOSIS — R0789 Other chest pain: Secondary | ICD-10-CM

## 2013-05-30 DIAGNOSIS — K3189 Other diseases of stomach and duodenum: Secondary | ICD-10-CM

## 2013-05-30 DIAGNOSIS — R1013 Epigastric pain: Secondary | ICD-10-CM

## 2013-05-30 MED ORDER — FAMOTIDINE 20 MG PO TABS: 20.0000 mg | ORAL_TABLET | Freq: Two times a day (BID) | ORAL | Status: DC

## 2013-05-30 NOTE — Patient Instructions (Signed)
Indigestion  Indigestion is discomfort in the upper abdomen that is caused by underlying problems such as gastroesophageal reflux disease (GERD), ulcers, or gallbladder problems.   CAUSES   Indigestion can be caused by many things. Possible causes include:  · Stomach acid in the esophagus.  · Stomach infections, usually caused by the bacteria H. pylori.  · Being overweight.  · Hiatal hernia. This means part of the stomach pushes up through the diaphragm.  · Overeating.  · Emotional problems, such as stress, anxiety, or depression.  · Poor nutrition.  · Consuming too much alcohol, tobacco, or caffeine.  · Consuming spicy foods, fats, peppermint, chocolate, tomato products, citrus, or fruit juices.  · Medicines such as aspirin and other anti-inflammatory drugs, hormones, steroids, and thyroid medicines.  · Gastroparesis. This is a condition in which the stomach does not empty properly.  · Stomach cancer.  · Pregnancy, due to an increase in hormone levels, a relaxation of muscles in the digestive tract, and pressure on the stomach from the growing fetus.  SYMPTOMS   · Uncomfortable feeling of fullness after eating.  · Pain or burning sensation in the upper abdomen.  · Bloating.  · Belching and gas.  · Nausea and vomiting.  · Acidic taste in the mouth.  · Burning sensation in the chest (heartburn).  DIAGNOSIS   Your caregiver will review your medical history and perform a physical exam. Other tests, such as blood tests, stool tests, X-rays, and other imaging scans, may be done to check for more serious problems.  TREATMENT   Liquid antacids and other drugs may be given to block stomach acid secretion. Medicines that increase esophageal muscle tone may also be given to help reduce symptoms. If an infection is found, antibiotic medicine may be given.  HOME CARE INSTRUCTIONS  · Avoid foods and drinks that make your symptoms worse, such as:  · Caffeine or alcoholic drinks.  · Chocolate.  · Peppermint or mint  flavorings.  · Garlic and onions.  · Spicy foods.  · Citrus fruits, such as oranges, lemons, or limes.  · Tomato-based foods such as sauce, chili, salsa, and pizza.  · Fried and fatty foods.  · Avoid eating for the 3 hours prior to your bedtime.  · Eat small, frequent meals instead of large meals.  · Stop smoking if you smoke.  · Maintain a healthy weight.  · Wear loose-fitting clothing. Do not wear anything tight around your waist that causes pressure on your stomach.  · Raise the head of your bed 4 to 8 inches with wood blocks to help you sleep. Extra pillows will not help.  · Only take over-the-counter or prescription medicines as directed by your caregiver.  · Do not take aspirin, ibuprofen, or other nonsteroidal anti-inflammatory drugs (NSAIDs).  SEEK IMMEDIATE MEDICAL CARE IF:   · You are not better after 2 days.  · You have chest pressure or pain that radiates up into your neck, arms, back, jaw, or upper abdomen.  · You have difficulty swallowing.  · You keep vomiting.  · You have black or bloody stools.  · You have a fever.  · You have dizziness, fainting, difficulty breathing, or heavy sweating.  · You have severe abdominal pain.  · You lose weight without trying.  MAKE SURE YOU:  · Understand these instructions.  · Will watch your condition.  · Will get help right away if you are not doing well or get worse.  Document Released: 10/14/2004   Document Revised: 11/29/2011 Document Reviewed: 04/21/2011  [employer]® Patient Information ©2014 [employer].

## 2013-05-30 NOTE — Progress Notes (Signed)
S:  This 67 y.o. AA female returns for reassessment of upper abd discomfort that seems to be located at L anterior rib cage. She describes it as a "pressure in left chest area like something is lodged within"; it originally felt like a pulled muscle.       Discomfort is not present everyday and pt does not feel it all day. Discomfort is less with good posture while poor posture produces pressure. Sometimes bra feels uncomfortable and slight burning sensation is present. Pressure experienced after large meals and belching relieves the pressure. Pt notes she has always had some type of "stomach issues"; she has not tried any OTC product for this problem. Stools are normal. Pt denies difficulty swallowing,mouth ulcers, n/v or change in stool color.  Pt has HTN; BP readings at home: 141-168/ 81-98 w/ HR= 58-85. Pt is compliant w/ medication and reports no symptoms or medication side effects.   Patient Active Problem List   Diagnosis Date Noted  . Hypercholesteremia 04/05/2012  . HTN, goal below 140/80 04/05/2012   PMHx, Soc Hx and Medications reconciled.  ROS: As per HPI.  O: Filed Vitals:   05/30/13 1023  BP: 200/90  Pulse: 66  Temp: 98.1 F (36.7 C)  Resp: 16   GEN: In NAD; WN,WD. HENT: [place]/AT. EOMI w/ clear conj/sclerae. Otherwise unremarkable. NECK: Supple w/o LAN or TMG. COR: RRR. NOrmal S1, S2. No m/g/r. LUNGS: CTA; no rales , wheezes or rhonchi. CHEST WALL: Lower L rib (anterior)- minimal tenderness; pt states pressure applied actually relieves pressure. ABD: Normal appearance, soft, no guarding, no HSM or masses. Murphy's sign- negative. MS: MAEs; no c/c/e. NEURO: A&O x 3; CNs intact. Nonfocal.  [facility] reading (PRIMARY) by  Dr. Audria Nine: CXR- Normal cardiac silhouette and clear lung fields. No active disease. Thoracic spine osteopenic.  A/P: Chest pressure - Plan: DG Chest 2 View  Rib pain - Plan: DG Chest 2 View  Dyspepsia- Trial Famotidine 20 mg 1 tab bid; modify  nutrition.  RTC if no improvement in 4 weeks.

## 2013-07-02 DIAGNOSIS — Z23 Encounter for immunization: Secondary | ICD-10-CM | POA: Diagnosis not present

## 2013-09-22 ENCOUNTER — Other Ambulatory Visit: Payer: Self-pay | Admitting: Family Medicine

## 2013-09-24 ENCOUNTER — Other Ambulatory Visit: Payer: Self-pay | Admitting: Family Medicine

## 2013-11-08 ENCOUNTER — Ambulatory Visit: Payer: Medicare Other | Admitting: Family Medicine

## 2013-11-23 ENCOUNTER — Other Ambulatory Visit: Payer: Self-pay | Admitting: Family Medicine

## 2013-11-23 ENCOUNTER — Ambulatory Visit (INDEPENDENT_AMBULATORY_CARE_PROVIDER_SITE_OTHER): Payer: Medicare Other | Admitting: Family Medicine

## 2013-11-23 ENCOUNTER — Encounter: Payer: Self-pay | Admitting: Family Medicine

## 2013-11-23 VITALS — BP 200/91 | HR 70 | Temp 98.4°F | Resp 16 | Ht 62.5 in | Wt 113.4 lb

## 2013-11-23 DIAGNOSIS — I10 Essential (primary) hypertension: Secondary | ICD-10-CM | POA: Diagnosis not present

## 2013-11-23 DIAGNOSIS — G479 Sleep disorder, unspecified: Secondary | ICD-10-CM | POA: Diagnosis not present

## 2013-11-23 DIAGNOSIS — G4720 Circadian rhythm sleep disorder, unspecified type: Secondary | ICD-10-CM

## 2013-11-23 MED ORDER — FAMOTIDINE 20 MG PO TABS: ORAL_TABLET | ORAL | Status: DC

## 2013-11-23 NOTE — Patient Instructions (Addendum)
MELATONIN 3 mg  1 tablet at bedtime may help you rest better. You can get this supplement over-the-counter without a prescription.   Insomnia Insomnia is frequent trouble falling and/or staying asleep. Insomnia can be a long term problem or a short term problem. Both are common. Insomnia can be a short term problem when the wakefulness is related to a certain stress or worry. Long term insomnia is often related to ongoing stress during waking hours and/or poor sleeping habits. Overtime, sleep deprivation itself can make the problem worse. Every little thing feels more severe because you are overtired and your ability to cope is decreased. CAUSES   Stress, anxiety, and depression.  Poor sleeping habits.  Distractions such as TV in the bedroom.  Naps close to bedtime.  Engaging in emotionally charged conversations before bed.  Technical reading before sleep.  Alcohol and other sedatives. They may make the problem worse. They can hurt normal sleep patterns and normal dream activity.  Stimulants such as caffeine for several hours prior to bedtime.  Pain syndromes and shortness of breath can cause insomnia.  Exercise late at night.  Changing time zones may cause sleeping problems (jet lag). It is sometimes helpful to have someone observe your sleeping patterns. They should look for periods of not breathing during the night (sleep apnea). They should also look to see how long those periods last. If you live alone or observers are uncertain, you can also be observed at a sleep clinic where your sleep patterns will be professionally monitored. Sleep apnea requires a checkup and treatment. Give your caregivers your medical history. Give your caregivers observations your family has made about your sleep.  SYMPTOMS   Not feeling rested in the morning.  Anxiety and restlessness at bedtime.  Difficulty falling and staying asleep. TREATMENT   Your caregiver may prescribe treatment for an  underlying medical disorders. Your caregiver can give advice or help if you are using alcohol or other drugs for self-medication. Treatment of underlying problems will usually eliminate insomnia problems.  Medications can be prescribed for short time use. They are generally not recommended for lengthy use.  Over-the-counter sleep medicines are not recommended for lengthy use. They can be habit forming.  You can promote easier sleeping by making lifestyle changes such as:  Using relaxation techniques that help with breathing and reduce muscle tension.  Exercising earlier in the day.  Changing your diet and the time of your last meal. No night time snacks.  Establish a regular time to go to bed.  Counseling can help with stressful problems and worry.  Soothing music and white noise may be helpful if there are background noises you cannot remove.  Stop tedious detailed work at least one hour before bedtime. HOME CARE INSTRUCTIONS   Keep a diary. Inform your caregiver about your progress. This includes any medication side effects. See your caregiver regularly. Take note of:  Times when you are asleep.  Times when you are awake during the night.  The quality of your sleep.  How you feel the next day. This information will help your caregiver care for you.  Get out of bed if you are still awake after 15 minutes. Read or do some quiet activity. Keep the lights down. Wait until you feel sleepy and go back to bed.  Keep regular sleeping and waking hours. Avoid naps.  Exercise regularly.  Avoid distractions at bedtime. Distractions include watching television or engaging in any intense or detailed activity like attempting to  balance the household checkbook.  Develop a bedtime ritual. Keep a familiar routine of bathing, brushing your teeth, climbing into bed at the same time each night, listening to soothing music. Routines increase the success of falling to sleep faster.  Use  relaxation techniques. This can be using breathing and muscle tension release routines. It can also include visualizing peaceful scenes. You can also help control troubling or intruding thoughts by keeping your mind occupied with boring or repetitive thoughts like the old concept of counting sheep. You can make it more creative like imagining planting one beautiful flower after another in your backyard garden.  During your day, work to eliminate stress. When this is not possible use some of the previous suggestions to help reduce the anxiety that accompanies stressful situations. MAKE SURE YOU:   Understand these instructions.  Will watch your condition.  Will get help right away if you are not doing well or get worse. Document Released: 09/03/2000 Document Revised: 11/29/2011 Document Reviewed: 10/04/2007 [facility] Patient Information 2014 [place].

## 2013-11-23 NOTE — Progress Notes (Signed)
S:  This 68 y.o. AA female has HTN; she is compliant w/ medications but is not monitoring her BP. Pt has "white-coat HTN". She is retired and spends her time reading, volunteering and relaxing. Pt denies fatigue, vision disturbances, CP or tightness, palpitations, SOB or DOE, edema, myalgias/ arthralgias, HA, dizziness, unilateral numbness or weakness or syncope.  Pt has chronic sleep disturbance; she awakens in the middle of the night after 3-4 hours of sleep. She may go back to sleep but typically remains awake for awake for > 15 minutes before she resumes sleep. OTC generic APAP facilitates sleep when she takes this medication to ease shoulder pain. She has not tried any OTC sleep aids. She finds a cup of tea partially effective.  Patient Active Problem List   Diagnosis Date Noted  . Sleep pattern disturbance 11/23/2013  . Dyspepsia 05/30/2013  . Hypercholesteremia 04/05/2012  . HTN, goal below 140/80 04/05/2012    Prior to Admission medications   Medication Sig Start Date End Date Taking? Authorizing Provider  atenolol (TENORMIN) 25 MG tablet TAKE 1 TABLET (25 MG TOTAL) BY MOUTH DAILY. 05/03/13  Yes Barton Fanny, MD  famotidine (PEPCID) 20 MG tablet TAKE 1 TABLET (20 MG TOTAL) BY MOUTH 2 (TWO) TIMES DAILY. 11/23/13  Yes Barton Fanny, MD  simvastatin (ZOCOR) 40 MG tablet TAKE 1 TABLET AT BEDTIME 05/03/13  Yes Barton Fanny, MD    PMHx, Soc and Fam Hx reviewed.  ROS: As per HPI.  O: Filed Vitals:   11/23/13 1458  BP: 200/91                         BP recheck by provider: 180/ 90  Pulse: 70  Temp: 98.4 F (36.9 C)  Resp: 16   GEN: in NAD; WN,WD. HENT: [place]/AT; EOMI w/ clear conj/sclerae. Otherwise unremarkable. COR; RRR. LUNGS; Normal resp rate and effort. SKIN: W&D; intact w/o diaphoresis or erythema. NEURO: A&O x 3; CNs intact. Nonfocal.  A/P: Sleep pattern disturbance- Trial of Melatonin 3 mg  1 tablet at bedtime. Address bedtime routine.  HTN, goal below  140/80- Monitor BP several times a week. Continue current medication.   Meds ordered this encounter  Medications  . famotidine (PEPCID) 20 MG tablet    Sig: TAKE 1 TABLET (20 MG TOTAL) BY MOUTH 2 (TWO) TIMES DAILY.    Dispense:  60 tablet    Refill:  5

## 2013-11-26 DIAGNOSIS — Z1231 Encounter for screening mammogram for malignant neoplasm of breast: Secondary | ICD-10-CM | POA: Diagnosis not present

## 2013-12-06 ENCOUNTER — Encounter: Payer: Self-pay | Admitting: Family Medicine

## 2013-12-27 ENCOUNTER — Encounter: Payer: Self-pay | Admitting: Family Medicine

## 2014-02-06 ENCOUNTER — Ambulatory Visit (INDEPENDENT_AMBULATORY_CARE_PROVIDER_SITE_OTHER): Payer: Medicare Other | Admitting: Family Medicine

## 2014-02-06 VITALS — BP 165/85 | HR 78 | Temp 97.9°F | Resp 18 | Ht 62.0 in | Wt 113.0 lb

## 2014-02-06 DIAGNOSIS — R059 Cough, unspecified: Secondary | ICD-10-CM | POA: Diagnosis not present

## 2014-02-06 DIAGNOSIS — I10 Essential (primary) hypertension: Secondary | ICD-10-CM | POA: Diagnosis not present

## 2014-02-06 DIAGNOSIS — R058 Other specified cough: Secondary | ICD-10-CM

## 2014-02-06 DIAGNOSIS — R05 Cough: Secondary | ICD-10-CM | POA: Diagnosis not present

## 2014-02-06 DIAGNOSIS — R0982 Postnasal drip: Secondary | ICD-10-CM

## 2014-02-06 MED ORDER — HYDROCODONE-HOMATROPINE 5-1.5 MG/5ML PO SYRP: 5.0000 mL | ORAL_SOLUTION | Freq: Every evening | ORAL | Status: DC | PRN

## 2014-02-06 MED ORDER — BENZONATATE 100 MG PO CAPS: 200.0000 mg | ORAL_CAPSULE | Freq: Two times a day (BID) | ORAL | Status: DC | PRN

## 2014-02-06 NOTE — Patient Instructions (Signed)

## 2014-02-06 NOTE — Progress Notes (Signed)
Chief Complaint:  Chief Complaint  Patient presents with  . Cough     8 days of coughing and scrathy throat    HPI: Kristy [name] is a 68 y.o. female who is here for  Chills and hot sweats and sore muscles from May 6-12 and then on May 12 she felt better but continues to have persistnet cough. She has had scratchy throat, mucus has been white and a little heavier and cloudy.  No fevers, chills. Her blood pressure has been high. She has been taking her meds but also taking tylenol cough and cold, does not know if she is having fever based on taking this. She does not know if the tylenol has DM in it to make her blood pressure rise    Past Medical History  Diagnosis Date  . Hypertension   . Hyperlipidemia   . Anemia   . Allergy    Past Surgical History  Procedure Laterality Date  . Partial hysterectomy    . Tubal ligation    . Abdominal hysterectomy     History   Social History  . Marital Status: Divorced    Spouse Name: N/A    Number of Children: N/A  . Years of Education: N/A   Social History Main Topics  . Smoking status: Never Smoker   . Smokeless tobacco: None  . Alcohol Use: None  . Drug Use: None  . Sexual Activity: None   Other Topics Concern  . None   Social History Narrative  . None   Family History  Problem Relation Age of Onset  . Heart attack Mother   . Heart disease Mother     Heart attack  . Stroke Father    No Known Allergies Prior to Admission medications   Medication Sig Start Date End Date Taking? Authorizing Provider  atenolol (TENORMIN) 25 MG tablet TAKE 1 TABLET (25 MG TOTAL) BY MOUTH DAILY. 05/03/13   Barton Fanny, MD  famotidine (PEPCID) 20 MG tablet TAKE 1 TABLET (20 MG TOTAL) BY MOUTH 2 (TWO) TIMES DAILY. 11/23/13   Barton Fanny, MD  simvastatin (ZOCOR) 40 MG tablet TAKE 1 TABLET AT BEDTIME 05/03/13   Barton Fanny, MD     ROS: The patient denies night sweats, unintentional weight loss, chest pain,  palpitations, wheezing, dyspnea on exertion, nausea, vomiting, abdominal pain, dysuria, hematuria, melena, numbness, weakness, or tingling.   All other systems have been reviewed and were otherwise negative with the exception of those mentioned in the HPI and as above.    PHYSICAL EXAM: Filed Vitals:   02/06/14 1034  BP: 165/85  Pulse: 78  Temp: 97.9 F (36.6 C)  Resp: 18   Filed Vitals:   02/06/14 1034  Height: 5\' 2"  (1.575 m)  Weight: 113 lb (51.256 kg)   Body mass index is 20.66 kg/(m^2).  General: Alert, no acute distress HEENT:  Normocephalic, atraumatic, oropharynx patent. EOMI, PERRLA, no esudates, + PND, TM nl, boggy nares minimal, no sinus tenderness Cardiovascular:  Regular rate and rhythm, no rubs murmurs or gallops.  No Carotid bruits, radial pulse intact. No pedal edema.  Respiratory: Clear to auscultation bilaterally.  No wheezes, rales, or rhonchi.  No cyanosis, no use of accessory musculature GI: No organomegaly, abdomen is soft and non-tender, positive bowel sounds.  No masses. Skin: No rashes. Neurologic: Facial musculature symmetric. Psychiatric: Patient is appropriate throughout our interaction. Lymphatic: No cervical lymphadenopathy Musculoskeletal: Gait intact.   LABS:  EKG/XRAY:   Primary read interpreted by Dr. Marin Comment at [facility].   ASSESSMENT/PLAN: Encounter Diagnoses  Name Primary?  . Post-viral cough syndrome Yes  . Cough   . Post-nasal drip   . HTN (hypertension)    Tessalon perles Hycodan Monitor BP F/u prn  Gross sideeffects, risk and benefits, and alternatives of medications d/w patient. Patient is aware that all medications have potential sideeffects and we are unable to predict every sideeffect or drug-drug interaction that may occur.  Glenford Bayley, DO 02/06/2014 11:04 AM

## 2014-03-11 ENCOUNTER — Ambulatory Visit (INDEPENDENT_AMBULATORY_CARE_PROVIDER_SITE_OTHER): Payer: Medicare Other | Admitting: Family Medicine

## 2014-03-11 VITALS — BP 150/90 | HR 64 | Temp 98.0°F | Resp 18 | Ht 63.5 in | Wt 112.0 lb

## 2014-03-11 DIAGNOSIS — H938X1 Other specified disorders of right ear: Secondary | ICD-10-CM

## 2014-03-11 DIAGNOSIS — K219 Gastro-esophageal reflux disease without esophagitis: Secondary | ICD-10-CM

## 2014-03-11 DIAGNOSIS — R05 Cough: Secondary | ICD-10-CM | POA: Diagnosis not present

## 2014-03-11 DIAGNOSIS — J301 Allergic rhinitis due to pollen: Secondary | ICD-10-CM | POA: Diagnosis not present

## 2014-03-11 DIAGNOSIS — H938X9 Other specified disorders of ear, unspecified ear: Secondary | ICD-10-CM | POA: Diagnosis not present

## 2014-03-11 DIAGNOSIS — Z9109 Other allergy status, other than to drugs and biological substances: Secondary | ICD-10-CM

## 2014-03-11 DIAGNOSIS — R059 Cough, unspecified: Secondary | ICD-10-CM

## 2014-03-11 MED ORDER — OMEPRAZOLE 20 MG PO CPDR: 20.0000 mg | DELAYED_RELEASE_CAPSULE | Freq: Every day | ORAL | Status: DC

## 2014-03-11 MED ORDER — ACETIC ACID 2 % OT SOLN: 4.0000 [drp] | Freq: Three times a day (TID) | OTIC | Status: DC

## 2014-03-11 NOTE — Progress Notes (Signed)
 Chief Complaint:  Chief Complaint  Patient presents with  . Follow-up    cough    HPI: Kristy [name] is a 68 y.o. female who is here for cough sxs, better now that she has taken tessalon perles and hycodan, she has also been taking zyrtec regular and that seems to help. When she is not on it she has a tickle in the back of her throat. She denies sinus pressure, she has some fluid in her right ear. No pain, no fevers or chills. She is doing well overall. Her BP is high but she takes atenolol at night and not in the AM. She does not remember why she does this.   Past Medical History  Diagnosis Date  . Hypertension   . Hyperlipidemia   . Anemia   . Allergy    Past Surgical History  Procedure Laterality Date  . Partial hysterectomy    . Tubal ligation    . Abdominal hysterectomy     History   Social History  . Marital Status: Divorced    Spouse Name: N/A    Number of Children: N/A  . Years of Education: N/A   Social History Main Topics  . Smoking status: Never Smoker   . Smokeless tobacco: None  . Alcohol Use: None  . Drug Use: None  . Sexual Activity: None   Other Topics Concern  . None   Social History Narrative  . None   Family History  Problem Relation Age of Onset  . Heart attack Mother   . Heart disease Mother     Heart attack  . Stroke Father    No Known Allergies Prior to Admission medications   Medication Sig Start Date End Date Taking? Authorizing Manjot Beumer  atenolol (TENORMIN) 25 MG tablet TAKE 1 TABLET (25 MG TOTAL) BY MOUTH DAILY. 05/03/13  Yes Barton Fanny, MD  famotidine (PEPCID) 20 MG tablet TAKE 1 TABLET (20 MG TOTAL) BY MOUTH 2 (TWO) TIMES DAILY. 11/23/13  Yes Barton Fanny, MD  simvastatin (ZOCOR) 40 MG tablet TAKE 1 TABLET AT BEDTIME 05/03/13  Yes Barton Fanny, MD  benzonatate (TESSALON) 100 MG capsule Take 2 capsules (200 mg total) by mouth 2 (two) times daily as needed for cough. 02/06/14    P , DO    HYDROcodone-homatropine (HYCODAN) 5-1.5 MG/5ML syrup Take 5 mLs by mouth at bedtime as needed for cough. 02/06/14    P , DO     ROS: The patient denies fevers, chills, night sweats, unintentional weight loss, chest pain, palpitations, wheezing, dyspnea on exertion, nausea, vomiting, abdominal pain, dysuria, hematuria, melena, numbness, weakness, or tingling.   All other systems have been reviewed and were otherwise negative with the exception of those mentioned in the HPI and as above.    PHYSICAL EXAM: Filed Vitals:   03/11/14 1036  BP: 150/90  Pulse: 64  Temp: 98 F (36.7 C)  Resp: 18   Filed Vitals:   03/11/14 1036  Height: 5' 3.5" (1.613 m)  Weight: 112 lb (50.803 kg)   Body mass index is 19.53 kg/(m^2).  General: Alert, no acute distress HEENT:  Normocephalic, atraumatic, oropharynx patent. EOMI, PERRLA. + Right TM slightly fluid filled. NO erythema.  Cardiovascular:  Regular rate and rhythm, no rubs murmurs or gallops.  No Carotid bruits, radial pulse intact. No pedal edema.  Respiratory: Clear to auscultation bilaterally.  No wheezes, rales, or rhonchi.  No cyanosis, no use of accessory musculature  GI: No organomegaly, abdomen is soft and non-tender, positive bowel sounds.  No masses. Skin: No rashes. Neurologic: Facial musculature symmetric. Psychiatric: Patient is appropriate throughout our interaction. Lymphatic: No cervical lymphadenopathy Musculoskeletal: Gait intact.   LABS: Results for orders placed in visit on 05/03/13  LIPID PANEL      Result Value Ref Range   Cholesterol 214 (*) 0 - 200 mg/dL   Triglycerides 46  <150 mg/dL   HDL 99  >39 mg/dL   Total CHOL/HDL Ratio 2.2     VLDL 9  0 - 40 mg/dL   LDL Cholesterol 106 (*) 0 - 99 mg/dL  COMPREHENSIVE METABOLIC PANEL      Result Value Ref Range   Sodium 142  135 - 145 mEq/L   Potassium 4.1  3.5 - 5.3 mEq/L   Chloride 105  96 - 112 mEq/L   CO2 27  19 - 32 mEq/L   Glucose, Bld 85  70 - 99 mg/dL    BUN 10  6 - 23 mg/dL   Creat 0.74  0.50 - 1.10 mg/dL   Total Bilirubin 0.9  0.3 - 1.2 mg/dL   Alkaline Phosphatase 68  39 - 117 U/L   AST 22  0 - 37 U/L   ALT 17  0 - 35 U/L   Total Protein 7.7  6.0 - 8.3 g/dL   Albumin 5.1  3.5 - 5.2 g/dL   Calcium 10.3  8.4 - 10.5 mg/dL  POCT URINALYSIS DIPSTICK      Result Value Ref Range   Color, UA yellow     Clarity, UA clear     Glucose, UA neg     Bilirubin, UA neg     Ketones, UA 15 mg     Spec Grav, UA 1.020     Blood, UA moderate     pH, UA 5.0     Protein, UA neg     Urobilinogen, UA 0.2     Nitrite, UA neg     Leukocytes, UA Trace    POCT UA - MICROSCOPIC ONLY      Result Value Ref Range   WBC, Ur, HPF, POC 0-4     RBC, urine, microscopic 0-6     Bacteria, U Microscopic trace     Mucus, UA small     Epithelial cells, urine per micros 1-12     Crystals, Ur, HPF, POC neg     Casts, Ur, LPF, POC neg     Yeast, UA neg       EKG/XRAY:   Primary read interpreted by Dr. Marin Comment at [facility].   ASSESSMENT/PLAN: Encounter Diagnoses  Name Primary?  . Cough Yes  . Gastroesophageal reflux disease without esophagitis   . Pollen allergies   . Ear fullness, right    C/w Zyrtec Take atenolol in the AM, monitor BP Switch from pepcid to prilosec daily to BID for GERD to see if cough sxs completely resolved.  Otc nasacort prn Try vosol and if no improvement then will consider abx if have ear pain, worsening sxs  Gross sideeffects, risk and benefits, and alternatives of medications d/w patient. Patient is aware that all medications have potential sideeffects and we are unable to predict every sideeffect or drug-drug interaction that may occur.  , [place], DO 03/11/2014 11:46 AM

## 2014-03-25 ENCOUNTER — Telehealth: Payer: Self-pay

## 2014-03-25 NOTE — Telephone Encounter (Signed)
Pt states the ear drops prescribed did not clear up the fluid,please advise   Best phone for pt is Neenah cornwallis

## 2014-03-25 NOTE — Telephone Encounter (Signed)
LM for pt to RTC- last seen 03/11/14 need to come in for a recheck.

## 2014-03-25 NOTE — Telephone Encounter (Signed)
Second phone number  [phone]

## 2014-03-26 ENCOUNTER — Ambulatory Visit (INDEPENDENT_AMBULATORY_CARE_PROVIDER_SITE_OTHER): Payer: Medicare Other | Admitting: Family Medicine

## 2014-03-26 VITALS — BP 140/90 | HR 69 | Temp 97.9°F | Resp 16 | Ht 62.25 in | Wt 114.4 lb

## 2014-03-26 DIAGNOSIS — H9209 Otalgia, unspecified ear: Secondary | ICD-10-CM | POA: Diagnosis not present

## 2014-03-26 DIAGNOSIS — H6590 Unspecified nonsuppurative otitis media, unspecified ear: Secondary | ICD-10-CM | POA: Diagnosis not present

## 2014-03-26 DIAGNOSIS — K219 Gastro-esophageal reflux disease without esophagitis: Secondary | ICD-10-CM | POA: Diagnosis not present

## 2014-03-26 DIAGNOSIS — H9201 Otalgia, right ear: Secondary | ICD-10-CM

## 2014-03-26 MED ORDER — OMEPRAZOLE 20 MG PO CPDR: 20.0000 mg | DELAYED_RELEASE_CAPSULE | Freq: Every day | ORAL | Status: DC

## 2014-03-26 MED ORDER — AMOXICILLIN 875 MG PO TABS: 875.0000 mg | ORAL_TABLET | Freq: Two times a day (BID) | ORAL | Status: DC

## 2014-03-26 NOTE — Progress Notes (Signed)
Chief Complaint:  Chief Complaint  Patient presents with  . Follow-up    ear pain    HPI: Kristy [name] is a 68 y.o. female who is here for  Recheck of right ear after volsol use. She has no improvement, has some pain on palpation now, She deneis fevers or chills.  GERD better on pepcid and not PPI, She wants to go back to pepcid, the cough has improved since most likely post viral cough and some allrgy component with GERD  Past Medical History  Diagnosis Date  . Hypertension   . Hyperlipidemia   . Anemia   . Allergy    Past Surgical History  Procedure Laterality Date  . Partial hysterectomy    . Tubal ligation    . Abdominal hysterectomy     History   Social History  . Marital Status: Divorced    Spouse Name: N/A    Number of Children: N/A  . Years of Education: N/A   Social History Main Topics  . Smoking status: Never Smoker   . Smokeless tobacco: None  . Alcohol Use: None  . Drug Use: None  . Sexual Activity: None   Other Topics Concern  . None   Social History Narrative  . None   Family History  Problem Relation Age of Onset  . Heart attack Mother   . Heart disease Mother     Heart attack  . Stroke Father    No Known Allergies Prior to Admission medications   Medication Sig Start Date End Date Taking? Authorizing Provider  acetic acid (VOSOL) 2 % otic solution Place 4 drops into the right ear 3 (three) times daily. 03/11/14  Yes Lorenzo Pereyra P Daenerys Buttram, DO  atenolol (TENORMIN) 25 MG tablet TAKE 1 TABLET (25 MG TOTAL) BY MOUTH DAILY. 05/03/13  Yes Barton Fanny, MD  famotidine (PEPCID) 20 MG tablet TAKE 1 TABLET (20 MG TOTAL) BY MOUTH 2 (TWO) TIMES DAILY. 11/23/13  Yes Barton Fanny, MD  omeprazole (PRILOSEC) 20 MG capsule Take 1 capsule (20 mg total) by mouth daily. 03/11/14  Yes Ameyah Bangura P Christel Bai, DO  simvastatin (ZOCOR) 40 MG tablet TAKE 1 TABLET AT BEDTIME 05/03/13  Yes Barton Fanny, MD     ROS: The patient denies fevers, chills, night sweats,  unintentional weight loss, chest pain, palpitations, wheezing, dyspnea on exertion, nausea, vomiting, abdominal pain, dysuria, hematuria, melena, numbness, weakness, or tingling.  All other systems have been reviewed and were otherwise negative with the exception of those mentioned in the HPI and as above.    PHYSICAL EXAM: Filed Vitals:   03/26/14 1609  BP: 140/90  Pulse: 69  Temp: 97.9 F (36.6 C)  Resp: 16   Filed Vitals:   03/26/14 1609  Height: 5' 2.25" (1.581 m)  Weight: 114 lb 6.4 oz (51.891 kg)   Body mass index is 20.76 kg/(m^2).  General: Alert, no acute distress HEENT:  Normocephalic, atraumatic, oropharynx patent. EOMI, PERRLA. Right ear TM slightl dull and erythematous Cardiovascular:  Regular rate and rhythm, no rubs murmurs or gallops.  No Carotid bruits, radial pulse intact. No pedal edema.  Respiratory: Clear to auscultation bilaterally.  No wheezes, rales, or rhonchi.  No cyanosis, no use of accessory musculature GI: No organomegaly, abdomen is soft and non-tender, positive bowel sounds.  No masses. Skin: No rashes. Neurologic: Facial musculature symmetric. Psychiatric: Patient is appropriate throughout our interaction. Lymphatic: No cervical lymphadenopathy Musculoskeletal: Gait intact.   LABS: Results  for orders placed in visit on 05/03/13  LIPID PANEL      Result Value Ref Range   Cholesterol 214 (*) 0 - 200 mg/dL   Triglycerides 46  <150 mg/dL   HDL 99  >39 mg/dL   Total CHOL/HDL Ratio 2.2     VLDL 9  0 - 40 mg/dL   LDL Cholesterol 106 (*) 0 - 99 mg/dL  COMPREHENSIVE METABOLIC PANEL      Result Value Ref Range   Sodium 142  135 - 145 mEq/L   Potassium 4.1  3.5 - 5.3 mEq/L   Chloride 105  96 - 112 mEq/L   CO2 27  19 - 32 mEq/L   Glucose, Bld 85  70 - 99 mg/dL   BUN 10  6 - 23 mg/dL   Creat 0.74  0.50 - 1.10 mg/dL   Total Bilirubin 0.9  0.3 - 1.2 mg/dL   Alkaline Phosphatase 68  39 - 117 U/L   AST 22  0 - 37 U/L   ALT 17  0 - 35 U/L   Total  Protein 7.7  6.0 - 8.3 g/dL   Albumin 5.1  3.5 - 5.2 g/dL   Calcium 10.3  8.4 - 10.5 mg/dL  POCT URINALYSIS DIPSTICK      Result Value Ref Range   Color, UA yellow     Clarity, UA clear     Glucose, UA neg     Bilirubin, UA neg     Ketones, UA 15 mg     Spec Grav, UA 1.020     Blood, UA moderate     pH, UA 5.0     Protein, UA neg     Urobilinogen, UA 0.2     Nitrite, UA neg     Leukocytes, UA Trace    POCT UA - MICROSCOPIC ONLY      Result Value Ref Range   WBC, Ur, HPF, POC 0-4     RBC, urine, microscopic 0-6     Bacteria, U Microscopic trace     Mucus, UA small     Epithelial cells, urine per micros 1-12     Crystals, Ur, HPF, POC neg     Casts, Ur, LPF, POC neg     Yeast, UA neg       EKG/XRAY:   Primary read interpreted by Dr. Marin Comment at [facility].   ASSESSMENT/PLAN: Encounter Diagnoses  Name Primary?  . Nonsuppurative otitis media, not specified as acute or chronic Yes  . Otalgia, right   . Gastroesophageal reflux disease without esophagitis    Rx Amoxacillin, gave her SEs and if she has issues will consider switching to azithromycin Rx Pepcid F/u prn  Gross sideeffects, risk and benefits, and alternatives of medications d/w patient. Patient is aware that all medications have potential sideeffects and we are unable to predict every sideeffect or drug-drug interaction that may occur.  Johnanthony Wilden, [place], DO 03/26/2014 5:28 PM

## 2014-04-08 ENCOUNTER — Telehealth: Payer: Self-pay

## 2014-04-08 NOTE — Telephone Encounter (Signed)
Dr Marin Comment,  Patient has finished all the medicines.  Still has ear issues.    Stated she did not personally hear from Dr. Marin Comment from her previous call.  I explained the clinical staff assists the doctors. She was okay with this.   Please call [phone]

## 2014-04-10 NOTE — Telephone Encounter (Signed)
lmom for pt to cb

## 2014-04-11 ENCOUNTER — Telehealth: Payer: Self-pay

## 2014-04-11 NOTE — Telephone Encounter (Signed)
Pt called back and reported that the amox Dr Marin Comment Rxd helped some, but ear pressure/reduced hearing has not resolved. She stated it is intermittent now, not constant, but still about same intensity as before. Pt states not pain, just the pressure and muffled sounds. OV note states will consider azithromycin if not resolved w/amox. Dr Marin Comment, please advise.

## 2014-04-11 NOTE — Telephone Encounter (Signed)
PATIENT IS CALLING BACK TO SPEAK WITH DR. Marin Comment. SHE SAID SHE DOESN'T UNDERSTAND THE VOICE MESSAGE SINCE IT HAD A MUFFLED SOUND. PLEASE CALL PATIENT! [phone]

## 2014-04-11 NOTE — Telephone Encounter (Signed)
Advise to call me back, will refer to ENT if she would like to do that. I am not sure a second round of abx ie azithromycin  would help since amoxacillin is pretty good. I just recommended it in case she has PCN allergies. I will try calling her again at 4

## 2014-04-13 ENCOUNTER — Other Ambulatory Visit: Payer: Self-pay | Admitting: Family Medicine

## 2014-05-16 ENCOUNTER — Encounter: Payer: Self-pay | Admitting: Family Medicine

## 2014-05-16 ENCOUNTER — Ambulatory Visit (INDEPENDENT_AMBULATORY_CARE_PROVIDER_SITE_OTHER): Payer: Medicare Other | Admitting: Family Medicine

## 2014-05-16 VITALS — BP 210/114 | HR 60 | Temp 98.7°F | Resp 16 | Ht 62.75 in | Wt 116.6 lb

## 2014-05-16 DIAGNOSIS — I10 Essential (primary) hypertension: Secondary | ICD-10-CM | POA: Diagnosis not present

## 2014-05-16 DIAGNOSIS — J3089 Other allergic rhinitis: Secondary | ICD-10-CM | POA: Diagnosis not present

## 2014-05-16 DIAGNOSIS — E7800 Pure hypercholesterolemia, unspecified: Secondary | ICD-10-CM

## 2014-05-16 DIAGNOSIS — Z139 Encounter for screening, unspecified: Secondary | ICD-10-CM

## 2014-05-16 DIAGNOSIS — Z0000 Encounter for general adult medical examination without abnormal findings: Secondary | ICD-10-CM | POA: Diagnosis not present

## 2014-05-16 DIAGNOSIS — J309 Allergic rhinitis, unspecified: Secondary | ICD-10-CM | POA: Insufficient documentation

## 2014-05-16 DIAGNOSIS — J302 Other seasonal allergic rhinitis: Secondary | ICD-10-CM

## 2014-05-16 LAB — COMPLETE METABOLIC PANEL WITH GFR
ALBUMIN: 4.8 g/dL (ref 3.5–5.2)
ALT: 17 U/L (ref 0–35)
AST: 21 U/L (ref 0–37)
Alkaline Phosphatase: 72 U/L (ref 39–117)
BUN: 10 mg/dL (ref 6–23)
CALCIUM: 10.2 mg/dL (ref 8.4–10.5)
CHLORIDE: 106 meq/L (ref 96–112)
CO2: 29 mEq/L (ref 19–32)
Creat: 0.74 mg/dL (ref 0.50–1.10)
GFR, EST NON AFRICAN AMERICAN: 85 mL/min
GLUCOSE: 89 mg/dL (ref 70–99)
Potassium: 4.8 mEq/L (ref 3.5–5.3)
Sodium: 142 mEq/L (ref 135–145)
Total Bilirubin: 0.5 mg/dL (ref 0.2–1.2)
Total Protein: 8.4 g/dL — ABNORMAL HIGH (ref 6.0–8.3)

## 2014-05-16 LAB — LIPID PANEL
CHOLESTEROL: 201 mg/dL — AB (ref 0–200)
HDL: 90 mg/dL (ref >39)
LDL Cholesterol: 101 mg/dL — ABNORMAL HIGH (ref 0–99)
TRIGLYCERIDES: 52 mg/dL (ref <150)
Total CHOL/HDL Ratio: 2.2 Ratio
VLDL: 10 mg/dL (ref 0–40)

## 2014-05-16 LAB — POCT URINALYSIS DIPSTICK
Bilirubin, UA: NEGATIVE
GLUCOSE UA: NEGATIVE
KETONES UA: NEGATIVE
Nitrite, UA: NEGATIVE
Protein, UA: NEGATIVE
Spec Grav, UA: 1.015
UROBILINOGEN UA: 0.2
pH, UA: 5.5

## 2014-05-16 MED ORDER — FAMOTIDINE 20 MG PO TABS: ORAL_TABLET | ORAL | Status: DC

## 2014-05-16 MED ORDER — ATENOLOL 25 MG PO TABS: ORAL_TABLET | ORAL | Status: DC

## 2014-05-16 MED ORDER — SIMVASTATIN 40 MG PO TABS: ORAL_TABLET | ORAL | Status: DC

## 2014-05-16 NOTE — Patient Instructions (Addendum)
Keeping You Healthy  Get These Tests  Blood Pressure- Have your blood pressure checked by your healthcare provider at least once a year.  Normal blood pressure is 120/80.  Weight- Have your body mass index (BMI) calculated to screen for obesity.  BMI is a measure of body fat based on height and weight.  You can calculate your own BMI at [email or web address].com/bmi/  Cholesterol- Have your cholesterol checked every year.  Diabetes- Have your blood sugar checked every year if you have high blood pressure, high cholesterol, a family history of diabetes or if you are overweight.  Pap Smear- Have a pap smear every 1 to 3 years if you have been sexually active.  If you are older than 65 and recent pap smears have been normal you may not need additional pap smears.  In addition, if you have had a hysterectomy  For benign disease additional pap smears are not necessary.  Mammogram-Yearly mammograms are essential for early detection of breast cancer  Screening for Colon Cancer- Colonoscopy starting at age 50. Screening may begin sooner depending on your family history and other health conditions.  Follow up colonoscopy as directed by your Gastroenterologist.  Screening for Osteoporosis- Screening begins at age 65 with bone density scanning, sooner if you are at higher risk for developing Osteoporosis.  Get these medicines  Calcium with Vitamin D- Your body requires 1200-1500 mg of Calcium a day and 800-1000 IU of Vitamin D a day.  You can only absorb 500 mg of Calcium at a time therefore Calcium must be taken in 2 or 3 separate doses throughout the day.  Hormones- Hormone therapy has been associated with increased risk for certain cancers and heart disease.  Talk to your healthcare provider about if you need relief from menopausal symptoms.  Aspirin- Ask your healthcare provider about taking Aspirin to prevent Heart Disease and Stroke.  Get these Immuniztions  Flu shot- Every fall  Pneumonia  shot- Once after the age of 65; if you are younger ask your healthcare provider if you need a pneumonia shot.  Tetanus- Every ten years.  Zostavax- Once after the age of 60 to prevent shingles.  Take these steps  Don't smoke- Your healthcare provider can help you quit. For tips on how to quit, ask your healthcare provider or go to www.smokefree.gov or call 1-800 QUIT-NOW.  Be physically active- Exercise 5 days a week for a minimum of 30 minutes.  If you are not already physically active, start slow and gradually work up to 30 minutes of moderate physical activity.  Try walking, dancing, bike riding, swimming, etc.  Eat a healthy diet- Eat a variety of healthy foods such as fruits, vegetables, whole grains, low fat milk, low fat cheeses, yogurt, lean meats, chicken, fish, eggs, dried beans, tofu, etc.  For more information go to www.thenutritionsource.org  Dental visit- Brush and floss teeth twice daily; visit your dentist twice a year.  Eye exam- Visit your Optometrist or Ophthalmologist yearly.  Drink alcohol in moderation- Limit alcohol intake to one drink or less a day.  Never drink and drive.  Depression- Your emotional health is as important as your physical health.  If you're feeling down or losing interest in things you normally enjoy, please talk to your healthcare provider.  Seat Belts- can save your life; always wear one  Smoke/Carbon Monoxide detectors- These detectors need to be installed on the appropriate level of your home.  Replace batteries at least once a year.  Violence- If anyone   is threatening or hurting you, please tell your healthcare provider.  Living Will/ Health care power of attorney- Discuss with your healthcare provider and family.       Mediterranean Diet  Why follow it? Research shows.   Those who follow the Mediterranean diet have a reduced risk of heart disease    The diet is associated with a reduced incidence of Parkinson's and Alzheimer's  diseases   People following the diet may have longer life expectancies and lower rates of chronic diseases    The Dietary Guidelines for Americans recommends the Mediterranean diet as an eating plan to promote health and prevent disease  What Is the Mediterranean Diet?    Healthy eating plan based on typical foods and recipes of Mediterranean-style cooking   The diet is primarily a plant based diet; these foods should make up a majority of meals   Starches - Plant based foods should make up a majority of meals - They are an important sources of vitamins, minerals, energy, antioxidants, and fiber - Choose whole grains, foods high in fiber and minimally processed items  - Typical grain sources include wheat, oats, barley, corn, brown rice, bulgar, farro, millet, polenta, couscous  - Various types of beans include chickpeas, lentils, fava beans, black beans, white beans   Fruits  Veggies - Large quantities of antioxidant rich fruits & veggies; 6 or more servings  - Vegetables can be eaten raw or lightly drizzled with oil and cooked  - Vegetables common to the traditional Mediterranean Diet include: artichokes, arugula, beets, broccoli, brussel sprouts, cabbage, carrots, celery, collard greens, cucumbers, eggplant, kale, leeks, lemons, lettuce, mushrooms, okra, onions, peas, peppers, potatoes, pumpkin, radishes, rutabaga, shallots, spinach, sweet potatoes, turnips, zucchini - Fruits common to the Mediterranean Diet include: apples, apricots, avocados, cherries, clementines, dates, figs, grapefruits, grapes, melons, nectarines, oranges, peaches, pears, pomegranates, strawberries, tangerines  Fats - Replace butter and margarine with healthy oils, such as olive oil, canola oil, and tahini  - Limit nuts to no more than a handful a day  - Nuts include walnuts, almonds, pecans, pistachios, pine nuts  - Limit or avoid candied, honey roasted or heavily salted nuts - Olives are central to the Mediterranean  diet - can be eaten whole or used in a variety of dishes   Meats Protein - Limiting red meat: no more than a few times a month - When eating red meat: choose lean cuts and keep the portion to the size of deck of cards - Eggs: approx. 0 to 4 times a week  - Fish and lean poultry: at least 2 a week  - Healthy protein sources include, chicken, turkey, lean beef, lamb - Increase intake of seafood such as tuna, salmon, trout, mackerel, shrimp, scallops - Avoid or limit high fat processed meats such as sausage and bacon  Dairy - Include moderate amounts of low fat dairy products  - Focus on healthy dairy such as fat free yogurt, skim milk, low or reduced fat cheese - Limit dairy products higher in fat such as whole or 2% milk, cheese, ice cream  Alcohol - Moderate amounts of red wine is ok  - No more than 5 oz daily for women (all ages) and men older than age 65  - No more than 10 oz of wine daily for men younger than 65  Other - Limit sweets and other desserts  - Use herbs and spices instead of salt to flavor foods  - Herbs and spices common to   the traditional Mediterranean Diet include: basil, bay leaves, chives, cloves, cumin, fennel, garlic, lavender, marjoram, mint, oregano, parsley, pepper, rosemary, sage, savory, sumac, tarragon, thyme   It's not just a diet, it's a lifestyle:    The Mediterranean diet includes lifestyle factors typical of those in the region    Foods, drinks and meals are best eaten with others and savored   Daily physical activity is important for overall good health   This could be strenuous exercise like running and aerobics   This could also be more leisurely activities such as walking, housework, yard-work, or taking the stairs   Moderation is the key; a balanced and healthy diet accommodates most foods and drinks   Consider portion sizes and frequency of consumption of certain foods   Meal Ideas & Options:    Breakfast:  o Whole wheat toast or whole wheat English  muffins with peanut butter & hard boiled egg o Steel cut oats topped with apples & cinnamon and skim milk  o Fresh fruit: banana, strawberries, melon, berries, peaches  o Smoothies: strawberries, bananas, greek yogurt, peanut butter o Low fat greek yogurt with blueberries and granola  o Egg white omelet with spinach and mushrooms o Breakfast couscous: whole wheat couscous, apricots, skim milk, cranberries    Sandwiches:  o Hummus and grilled vegetables (peppers, zucchini, squash) on whole wheat bread   o Grilled chicken on whole wheat pita with lettuce, tomatoes, cucumbers or tzatziki  o Tuna salad on whole wheat bread: tuna salad made with greek yogurt, olives, red peppers, capers, green onions o Garlic rosemary lamb pita: lamb sauted with garlic, rosemary, salt & pepper; add lettuce, cucumber, greek yogurt to pita - flavor with lemon juice and black pepper    Seafood:  o Mediterranean grilled salmon, seasoned with garlic, basil, parsley, lemon juice and black pepper o Shrimp, lemon, and spinach whole-grain pasta salad made with low fat greek yogurt  o Seared scallops with lemon orzo  o Seared tuna steaks seasoned salt, pepper, coriander topped with tomato mixture of olives, tomatoes, olive oil, minced garlic, parsley, green onions and cappers    Meats:  o Herbed greek chicken salad with kalamata olives, cucumber, feta  o Red bell peppers stuffed with spinach, bulgur, lean ground beef (or lentils) & topped with feta   o Kebabs: skewers of chicken, tomatoes, onions, zucchini, squash  o [place] burgers: made with red onions, mint, dill, lemon juice, feta cheese topped with roasted red peppers   Vegetarian o Cucumber salad: cucumbers, artichoke hearts, celery, red onion, feta cheese, tossed in olive oil & lemon juice  o Hummus and whole grain pita points with a greek salad (lettuce, tomato, feta, olives, cucumbers, red onion) o Lentil soup with celery, carrots made with vegetable broth,  garlic, salt and pepper  o Tabouli salad: parsley, bulgur, mint, scallions, cucumbers, tomato, radishes, lemon juice, olive oil, salt and pepper.    I have provided Fish Oil supplement for you to try; Fish Oil has heart-healthy benefits. You may want to try a less expensive brand Petra [employer] is a good brand). Take 1000-1200 mg daily for benefit.

## 2014-05-16 NOTE — Progress Notes (Signed)
Subjective:    Patient ID: Kristy [name], female    DOB: Dec 08, 1945, 68 y.o.   MRN: [id number]  HPI  This 68 y.o. AA female is here for Annual [facility] Wellness exam. She has HTN, is compliant w/ medication and has asymptomatic. The pt leads an active life since retirement. Seasonal allergy symptoms are effectively treated w/ OTC Cetirizine 10 mg.   HCM: MMG- Current.           CRS- Current (2013 w/ 5-year recall).           DEXA- > 3 years ago (Osteopenia).           IMM- Current.           Vision- Biannually; wears corrective lenses but no diseases identified.   Patient Active Problem List   Diagnosis Date Noted  . Allergic rhinitis 05/16/2014  . Sleep pattern disturbance 11/23/2013  . Dyspepsia 05/30/2013  . Hypercholesteremia 04/05/2012  . HTN, goal below 140/80 04/05/2012    Prior to Admission medications   Medication Sig Start Date End Date Taking? Authorizing Provider  atenolol (TENORMIN) 25 MG tablet TAKE 1 TABLET (25 MG TOTAL) BY MOUTH DAILY.   Yes Barton Fanny, MD  cetirizine (ZYRTEC) 10 MG tablet Take 10 mg by mouth daily.   Yes Historical Provider, MD  famotidine (PEPCID) 20 MG tablet TAKE 1 TABLET (20 MG TOTAL) BY MOUTH 2 (TWO) TIMES DAILY.   Yes Barton Fanny, MD  simvastatin (ZOCOR) 40 MG tablet TAKE 1 TABLET AT BEDTIME   Yes Barton Fanny, MD   History   Social History  . Marital Status: Divorced    Spouse Name: N/A    Number of Children: N/A  . Years of Education: N/A   Occupational History  . Retired.   Social History Main Topics  . Smoking status: Never Smoker   . Smokeless tobacco: Not on file  . Alcohol Use: Not on file  . Drug Use: Not on file  . Sexual Activity: Not on file   Other Topics Concern  . Not on file   Social History Narrative  . No narrative on file   Family History  Problem Relation Age of Onset  . Heart attack Mother   . Heart disease Mother     Heart attack  . Hypertension Mother   . Stroke Mother   .  Stroke Father   . Stroke Sister   . Stroke Brother     Review of Systems  Constitutional: Negative.   HENT: Positive for rhinorrhea.        Occasional  Eyes: Negative.   Respiratory: Positive for cough.        Occasional  Cardiovascular: Negative.   Gastrointestinal: Negative.   Endocrine: Negative.   Genitourinary: Negative.   Musculoskeletal: Negative.   Skin: Negative.   Allergic/Immunologic: Positive for environmental allergies.  Neurological: Positive for light-headedness.       Occasional  Hematological: Negative.   Psychiatric/Behavioral: Negative.       Objective:   Physical Exam  Nursing note and vitals reviewed. Constitutional: She is oriented to person, place, and time. She appears well-developed and well-nourished. No distress.  BP recheck: 130-146/90-100.  HENT:  Head: Normocephalic and atraumatic.  Right Ear: Hearing, tympanic membrane, external ear and ear canal normal.  Left Ear: Hearing, tympanic membrane, external ear and ear canal normal.  Nose: Nose normal. No nasal deformity or septal deviation.  Mouth/Throat: Uvula is midline, oropharynx is clear  and moist and mucous membranes are normal. No oral lesions. Normal dentition. No dental caries. No oropharyngeal exudate.  Eyes: Conjunctivae, EOM and lids are normal. No scleral icterus.  Fundoscopic exam:      The right eye shows papilledema. The right eye shows no hemorrhage.       The left eye shows papilledema. The left eye shows no hemorrhage.  Wears corrective lenses; exam by eye care specialist every 2 years.  Neck: Trachea normal, normal range of motion, full passive range of motion without pain and phonation normal. Neck supple. No JVD present. No spinous process tenderness and no muscular tenderness present. Carotid bruit is not present. No mass and no thyromegaly present.  Cardiovascular: Normal rate, regular rhythm, S1 normal, S2 normal, normal heart sounds, intact distal pulses and normal pulses.    No extrasystoles are present. PMI is not displaced.  Exam reveals no gallop and no friction rub.   No murmur heard. Pulmonary/Chest: Effort normal and breath sounds normal. No respiratory distress. She has no decreased breath sounds. She has no wheezes. She has no rales.  Abdominal: Soft. Normal appearance, normal aorta and bowel sounds are normal. She exhibits no distension, no pulsatile midline mass and no mass. There is no hepatosplenomegaly. There is no tenderness. There is no guarding and no CVA tenderness.  Genitourinary:  Deferred.  Musculoskeletal:       Cervical back: Normal.       Thoracic back: Normal.       Lumbar back: Normal.  Remainder of exam unremarkable.  Lymphadenopathy:       Head (right side): No submental, no submandibular, no tonsillar, no preauricular, no posterior auricular and no occipital adenopathy present.       Head (left side): No submental, no submandibular, no tonsillar, no preauricular, no posterior auricular and no occipital adenopathy present.    She has no cervical adenopathy.       Right: No inguinal and no supraclavicular adenopathy present.       Left: No inguinal and no supraclavicular adenopathy present.  Neurological: She is alert and oriented to person, place, and time. She has normal strength. She displays no atrophy and no tremor. No cranial nerve deficit or sensory deficit. She exhibits normal muscle tone. She displays a negative Romberg sign. Coordination and gait normal.  Reflex Scores:      Tricep reflexes are 2+ on the right side and 2+ on the left side.      Bicep reflexes are 2+ on the right side and 2+ on the left side.      Brachioradialis reflexes are 2+ on the right side and 2+ on the left side.      Patellar reflexes are 2+ on the right side and 2+ on the left side. Skin: Skin is warm, dry and intact. No ecchymosis, no lesion and no rash noted. She is not diaphoretic. No cyanosis or erythema. Nails show no clubbing.  Psychiatric: She  has a normal mood and affect. Her speech is normal and behavior is normal. Judgment and thought content normal. Cognition and memory are normal.      Assessment & Plan:  Routine general medical examination at a health care facility - Plan: POCT urinalysis dipstick, IFOBT POC (occult bld, rslt in office)  HTN, goal below 140/80 - Recheck BP by this provider= 130-146/ 90-100. Stable on current medication. Plan: COMPLETE METABOLIC PANEL WITH GFR  Hypercholesteremia - Well controlled on current statin w/o side effects. Plan: Lipid panel  Other seasonal allergic rhinitis  Meds ordered this encounter  Medications  . simvastatin (ZOCOR) 40 MG tablet    Sig: TAKE 1 TABLET AT BEDTIME    Dispense:  90 tablet    Refill:  3  . famotidine (PEPCID) 20 MG tablet    Sig: TAKE 1 TABLET (20 MG TOTAL) BY MOUTH 2 (TWO) TIMES DAILY.    Dispense:  60 tablet    Refill:  5  . atenolol (TENORMIN) 25 MG tablet    Sig: TAKE 1 TABLET (25 MG TOTAL) BY MOUTH DAILY.    Dispense:  90 tablet    Refill:  3  . cetirizine (ZYRTEC) 10 MG tablet    Sig: Take 10 mg by mouth daily.

## 2014-05-17 NOTE — Progress Notes (Signed)
Quick Note:  Please notify pt that results are normal.   Provide pt with copy of labs. ______ 

## 2014-05-20 ENCOUNTER — Other Ambulatory Visit: Payer: Self-pay | Admitting: Family Medicine

## 2014-05-20 DIAGNOSIS — Z0000 Encounter for general adult medical examination without abnormal findings: Secondary | ICD-10-CM

## 2014-05-20 LAB — IFOBT (OCCULT BLOOD): IMMUNOLOGICAL FECAL OCCULT BLOOD TEST: NEGATIVE

## 2014-05-22 NOTE — Progress Notes (Signed)
Quick Note:  Please advise pt regarding following labs...  Recent home test for blood in stool is negative. ______

## 2014-05-29 ENCOUNTER — Encounter: Payer: Self-pay | Admitting: Radiology

## 2014-06-10 ENCOUNTER — Other Ambulatory Visit: Payer: Self-pay | Admitting: Family Medicine

## 2014-06-25 DIAGNOSIS — Z23 Encounter for immunization: Secondary | ICD-10-CM | POA: Diagnosis not present

## 2014-07-10 ENCOUNTER — Other Ambulatory Visit: Payer: Self-pay | Admitting: Family Medicine

## 2014-09-03 ENCOUNTER — Telehealth: Payer: Self-pay | Admitting: Family Medicine

## 2014-09-03 NOTE — Telephone Encounter (Signed)
Patient received the flu shot at [facility] on 06/25/14

## 2014-11-14 ENCOUNTER — Ambulatory Visit: Payer: Medicare Other | Admitting: Family Medicine

## 2014-11-21 ENCOUNTER — Ambulatory Visit (INDEPENDENT_AMBULATORY_CARE_PROVIDER_SITE_OTHER): Payer: Medicare Other | Admitting: Family Medicine

## 2014-11-21 ENCOUNTER — Encounter: Payer: Self-pay | Admitting: Family Medicine

## 2014-11-21 VITALS — BP 150/90 | HR 60 | Temp 98.4°F | Resp 16 | Ht 62.5 in | Wt 120.8 lb

## 2014-11-21 DIAGNOSIS — E7800 Pure hypercholesterolemia, unspecified: Secondary | ICD-10-CM

## 2014-11-21 DIAGNOSIS — I10 Essential (primary) hypertension: Secondary | ICD-10-CM | POA: Diagnosis not present

## 2014-11-21 MED ORDER — FAMOTIDINE 20 MG PO TABS: ORAL_TABLET | ORAL | Status: DC

## 2014-11-21 NOTE — Patient Instructions (Signed)

## 2014-11-21 NOTE — Progress Notes (Signed)
S:  This pleasant 69 y.o. Female is here for HTN follow-up. She is not checking BP at home. She is compliant with medication and is completely asymptomatic. She feels well and is active. Helping with her grandchildren.   Pt denies vision changes, diaphoresis, fatigue, CP or  tightness, palpitations, edema, SOB or DOE, HA, dizziness, numbness, weakness or syncope.  Patient Active Problem List   Diagnosis Date Noted  . Allergic rhinitis 05/16/2014  . Sleep pattern disturbance 11/23/2013  . Dyspepsia 05/30/2013  . Hypercholesteremia 04/05/2012  . HTN, goal below 140/80 04/05/2012    Prior to Admission medications   Medication Sig Start Date End Date Taking? Authorizing Provider  atenolol (TENORMIN) 25 MG tablet TAKE 1 TABLET (25 MG TOTAL) BY MOUTH DAILY. 05/16/14  Yes Barton Fanny, MD  cetirizine (ZYRTEC) 10 MG tablet Take 10 mg by mouth daily.   Yes Historical Provider, MD  famotidine (PEPCID) 20 MG tablet TAKE 1 TABLET (20 MG TOTAL) BY MOUTH 2 (TWO) TIMES DAILY.   Yes Barton Fanny, MD  simvastatin (ZOCOR) 40 MG tablet TAKE 1 TABLET AT BEDTIME 05/16/14  Yes Barton Fanny, MD    History   Social History  . Marital Status: Divorced    Spouse Name: N/A  . Number of Children: N/A  . Years of Education: N/A   Occupational History  . Not on file.   Social History Main Topics  . Smoking status: Never Smoker   . Smokeless tobacco: Not on file  . Alcohol Use: Not on file  . Drug Use: Not on file  . Sexual Activity: Not on file   Other Topics Concern  . Not on file   Social History Narrative    Family History  Problem Relation Age of Onset  . Heart attack Mother   . Heart disease Mother     Heart attack  . Hypertension Mother   . Stroke Mother   . Stroke Father   . Stroke Sister   . Stroke Brother     ROS: As per HPI.  O: Filed Vitals:   11/21/14 1100  BP: 150/90  Pulse:   Temp:   Resp:   Blood pressure 150/90, pulse 60, temperature 98.4 F (36.9  C), temperature source Oral, resp. rate 16, height 5' 2.5" (1.588 m), weight 120 lb 12.8 oz (54.795 kg), SpO2 100 %.  GEN: In NAD; WN,WD. HENT: [place]/AT. EOMI w/ clear conj/sclerae. Otherwise unremarkable. COR: RRR. LUNGS: Normal resp rate and effort. SKIN: W&D. NEURO: A&O x 3; CNs intact. Nonfocal.  A/P: HTN, goal below 140/80-  Stable on current medication; pt to check BP at home and bring readings to next visit.  Hypercholesteremia- continue current statin.  Meds ordered this encounter  Medications  . famotidine (PEPCID) 20 MG tablet    Sig: TAKE 1 TABLET (20 MG TOTAL) BY MOUTH 2 (TWO) TIMES DAILY.    Dispense:  60 tablet    Refill:  5

## 2015-01-30 ENCOUNTER — Ambulatory Visit (INDEPENDENT_AMBULATORY_CARE_PROVIDER_SITE_OTHER): Payer: Medicare Other | Admitting: Family Medicine

## 2015-01-30 ENCOUNTER — Encounter: Payer: Self-pay | Admitting: Family Medicine

## 2015-01-30 VITALS — BP 150/100 | HR 98 | Temp 98.0°F | Resp 16 | Ht 62.75 in | Wt 118.4 lb

## 2015-01-30 DIAGNOSIS — I10 Essential (primary) hypertension: Secondary | ICD-10-CM

## 2015-01-30 DIAGNOSIS — M25552 Pain in left hip: Secondary | ICD-10-CM

## 2015-01-30 NOTE — Patient Instructions (Signed)
Hip Pain Your hip is the joint between your upper legs and your lower pelvis. The bones, cartilage, tendons, and muscles of your hip joint perform a lot of work each day supporting your body weight and allowing you to move around. Hip pain can range from a minor ache to severe pain in one or both of your hips. Pain may be felt on the inside of the hip joint near the groin, or the outside near the buttocks and upper thigh. You may have swelling or stiffness as well.  HOME CARE INSTRUCTIONS   Take medicines only as directed by your health care provider.  Apply ice to the injured area:  Put ice in a plastic bag.  Place a towel between your skin and the bag.  Leave the ice on for 15-20 minutes at a time, 3-4 times a day.  Keep your leg raised (elevated) when possible to lessen swelling.  Avoid activities that cause pain.  Follow specific exercises as directed by your health care provider.  Sleep with a pillow between your legs on your most comfortable side.  Record how often you have hip pain, the location of the pain, and what it feels like. SEEK MEDICAL CARE IF:   You are unable to put weight on your leg.  Your hip is red or swollen or very tender to touch.  Your pain or swelling continues or worsens after 1 week.  You have increasing difficulty walking.  You have a fever. SEEK IMMEDIATE MEDICAL CARE IF:   You have fallen.  You have a sudden increase in pain and swelling in your hip. MAKE SURE YOU:   Understand these instructions.  Will watch your condition.  Will get help right away if you are not doing well or get worse. Document Released: 02/24/2010 Document Revised: 01/21/2014 Document Reviewed: 05/03/2013 ExitCare Patient Information 2015 [employer]. This information is not intended to replace advice given to you by your health care provider. Make sure you discuss any questions you have with your health care provider.  

## 2015-01-31 NOTE — Progress Notes (Signed)
S:  This 69 y.o female has HTN, readings at home = 130- 152/74-88, pulse= 55-65. She is compliant w/ medications and has no diaphoresis, fatigue, vision disturbances, CP or tightness, SOB or DOE, edema, cough, HA, dizziness, numbness or syncope. Pt has very active lifestyle.  Today; pt c/o L hip pain, onset 3-4 weeks ago. No known trauma. No aggravating factors. No nocturnal pain, relief w/ PAP x 2 doses. No associated fever, difficulty ambulating, LBP, numbness or weakness.  Patient Active Problem List   Diagnosis Date Noted  . Allergic rhinitis 05/16/2014  . Sleep pattern disturbance 11/23/2013  . Dyspepsia 05/30/2013  . Hypercholesteremia 04/05/2012  . HTN, goal below 140/80 04/05/2012    Prior to Admission medications   Medication Sig Start Date End Date Taking? Authorizing Provider  atenolol (TENORMIN) 25 MG tablet TAKE 1 TABLET (25 MG TOTAL) BY MOUTH DAILY. 05/16/14  Yes Barton Fanny, MD  cetirizine (ZYRTEC) 10 MG tablet Take 10 mg by mouth daily.   Yes Historical Provider, MD  famotidine (PEPCID) 20 MG tablet TAKE 1 TABLET (20 MG TOTAL) BY MOUTH 2 (TWO) TIMES DAILY. 11/21/14  Yes Barton Fanny, MD  simvastatin (ZOCOR) 40 MG tablet TAKE 1 TABLET AT BEDTIME 05/16/14  Yes Barton Fanny, MD   SURG, [facility] and [facility] HX reviewed.  ROS:  As per HPI.   O: Filed Vitals:   01/30/15 1033  BP: 150/100  Pulse: 98  Temp: 98 F (36.7 C)  Resp: 16    GEN: In NAD; WN,WD. HENT: [place]/AT; EOMI w/ clear conj/sclerae. Otherwise unremarkable. COR: RRR. LUNGS: Unlabored resp. BACK: Spine is straight and NT. No paravertebral muscle pain or spasm. Forward flexion negative. Mild discomfort w/ rotation to L and side bend to L. L  Lateral hip tender w/ deep palpation. SLR- negative.  NEURO: A&O x 3; CNs intact. Gait normal. Nonfocal.  A/P: HTN, goal below 140/80- Controlled according to pt's home readings; continue current medication and TLCs.  Hip pain, acute, left- Continue  symptomatic measures; moderate activities. RTC if symptoms worsen. Otherwise f/u in 5 months w/ [facility].

## 2015-06-06 ENCOUNTER — Other Ambulatory Visit: Payer: Self-pay

## 2015-06-06 MED ORDER — FAMOTIDINE 20 MG PO TABS: ORAL_TABLET | ORAL | Status: DC

## 2015-06-24 ENCOUNTER — Other Ambulatory Visit: Payer: Self-pay

## 2015-06-24 MED ORDER — SIMVASTATIN 40 MG PO TABS: ORAL_TABLET | ORAL | Status: DC

## 2015-07-03 ENCOUNTER — Ambulatory Visit (INDEPENDENT_AMBULATORY_CARE_PROVIDER_SITE_OTHER): Payer: Medicare Other | Admitting: Family Medicine

## 2015-07-03 ENCOUNTER — Encounter: Payer: Medicare Other | Admitting: Physician Assistant

## 2015-07-03 VITALS — BP 160/110 | HR 66 | Temp 98.0°F | Resp 17 | Ht 63.5 in | Wt 128.0 lb

## 2015-07-03 DIAGNOSIS — I10 Essential (primary) hypertension: Secondary | ICD-10-CM

## 2015-07-03 DIAGNOSIS — Z23 Encounter for immunization: Secondary | ICD-10-CM | POA: Diagnosis not present

## 2015-07-03 DIAGNOSIS — R42 Dizziness and giddiness: Secondary | ICD-10-CM | POA: Diagnosis not present

## 2015-07-03 LAB — POCT URINALYSIS DIP (MANUAL ENTRY)
Bilirubin, UA: NEGATIVE
Glucose, UA: NEGATIVE
Ketones, POC UA: NEGATIVE
Leukocytes, UA: NEGATIVE
Nitrite, UA: NEGATIVE
Protein Ur, POC: NEGATIVE
Spec Grav, UA: 1.015
Urobilinogen, UA: 0.2
pH, UA: 7

## 2015-07-03 LAB — POCT CBC
Granulocyte percent: 68.4 %G (ref 37–80)
HCT, POC: 44.1 % (ref 37.7–47.9)
Hemoglobin: 14.7 g/dL (ref 12.2–16.2)
Lymph, poc: 1.5 (ref 0.6–3.4)
MCH, POC: 29.6 pg (ref 27–31.2)
MCHC: 33.4 g/dL (ref 31.8–35.4)
MCV: 88.5 fL (ref 80–97)
MID (cbc): 0.3 (ref 0–0.9)
MPV: 8.2 fL (ref 0–99.8)
POC Granulocyte: 3.8 (ref 2–6.9)
POC LYMPH PERCENT: 26.4 %L (ref 10–50)
POC MID %: 5.2 %M (ref 0–12)
Platelet Count, POC: 184 10*3/uL (ref 142–424)
RBC: 4.98 M/uL (ref 4.04–5.48)
RDW, POC: 13.7 %
WBC: 5.6 10*3/uL (ref 4.6–10.2)

## 2015-07-03 LAB — POC MICROSCOPIC URINALYSIS (UMFC): Mucus: ABSENT

## 2015-07-03 LAB — GLUCOSE, POCT (MANUAL RESULT ENTRY): POC Glucose: 93 mg/dl (ref 70–99)

## 2015-07-03 MED ORDER — MECLIZINE HCL 12.5 MG PO TABS: 12.5000 mg | ORAL_TABLET | Freq: Three times a day (TID) | ORAL | Status: DC | PRN

## 2015-07-03 MED ORDER — AMLODIPINE BESYLATE 5 MG PO TABS
5.0000 mg | ORAL_TABLET | Freq: Every day | ORAL | Status: DC
Start: 2015-07-03 — End: 2016-06-13

## 2015-07-03 NOTE — Progress Notes (Addendum)
This chart was scribed for Robyn Haber, MD by Moises Blood, medical scribe at [facility].The patient was seen in exam room 14 and the patient's care was started at 11:32 AM.  Patient ID: Kristy [name] MRN: [id number], DOB: 18-Aug-1946, 69 y.o. Date of Encounter: 07/03/2015  Primary Physician: Ellsworth Lennox, MD  Chief Complaint:  Chief Complaint  Patient presents with  . Dizziness    HPI:  Kristy [name] is a 69 y.o. female who presents to [facility] complaining of dizziness, initially noticed at 3:00AM this morning when going to the bathroom. She went back to sleep; when she woke up this morning, the symptoms persists. She feels like she's going to fall to her right side. She does note of a slight pressure on her right temple and small increase in urination. She has fhx of hypertension. She denies double vision, trouble speech, slurred speech, headache, dry mouth, diarrhea, vomiting. She took famotidine, and atenolol this morning.   Patient was instructed to lay down in room to lower her blood pressure after triage. When sitting up, patient states getting dizzy. After standing up from sitting, she denies dizziness getting worse.   Her BP was elevated in triage: 210/104, stated having "white coat syndrome"  Her BP was rechecked in room (laying supine), left arm: 160/110 BP rechecked in room (sitting), left arm: 160/110 BP rechecked in room (standing), left arm: 160/110  Past Medical History  Diagnosis Date  . Hypertension   . Hyperlipidemia   . Anemia   . Allergy      Home Meds: Prior to Admission medications   Medication Sig Start Date End Date Taking? Authorizing Provider  atenolol (TENORMIN) 25 MG tablet TAKE 1 TABLET (25 MG TOTAL) BY MOUTH DAILY. 05/16/14  Yes Barton Fanny, MD  cetirizine (ZYRTEC) 10 MG tablet Take 10 mg by mouth daily.   Yes Historical Provider, MD  famotidine (PEPCID) 20 MG tablet TAKE 1 TABLET (20 MG  TOTAL) BY MOUTH 2 (TWO) TIMES DAILY. 06/06/15  Yes Chelle Jeffery, PA-C  simvastatin (ZOCOR) 40 MG tablet TAKE 1 TABLET AT BEDTIME. PATIENT NEEDS OFFICE VISIT/FASTING LABS FOR ADDITIONAL REFILLS 06/24/15  Yes Tishira R Brewington, PA-C    Allergies: No Known Allergies  Social History   Social History  . Marital Status: Divorced    Spouse Name: N/A  . Number of Children: N/A  . Years of Education: N/A   Occupational History  . Not on file.   Social History Main Topics  . Smoking status: Never Smoker   . Smokeless tobacco: Not on file  . Alcohol Use: Not on file  . Drug Use: Not on file  . Sexual Activity: Not on file   Other Topics Concern  . Not on file   Social History Narrative     Review of Systems: Constitutional: negative for chills, fever, night sweats, weight changes, or fatigue  HEENT: negative for vision changes, hearing loss, congestion, rhinorrhea, ST, epistaxis, or sinus pressure Cardiovascular: negative for chest pain or palpitations Respiratory: negative for hemoptysis, wheezing, shortness of breath, or cough Abdominal: negative for abdominal pain, nausea, vomiting, diarrhea, or constipation Dermatological: negative for rash Neurologic: negative for headache, or syncope; positive for dizziness All other systems reviewed and are otherwise negative with the exception to those above and in the HPI.  Physical Exam: Blood pressure 210/104, pulse 66, temperature 98 F (36.7 C), temperature source Oral, resp. rate 17, height 5' 3.5" (1.613 m), weight  128 lb (58.06 kg), SpO2 97 %., Body mass index is 22.32 kg/(m^2). General: Well developed, well nourished, in no acute distress. Head: Normocephalic, atraumatic, eyes without discharge, nares are without discharge. Bilateral auditory canals clear, TM's are without perforation, pearly grey and translucent with reflective cone of light bilaterally. Oral cavity moist, posterior pharynx without exudate, erythema,  peritonsillar abscess, or post nasal drip. Has arcus senilis  Neck: Supple. No thyromegaly. Full ROM. No lymphadenopathy. Lungs: Clear bilaterally to auscultation without wheezes, rales, or rhonchi. Breathing is unlabored. Heart: RRR with S1 S2. No murmurs, rubs, or gallops appreciated. Abdomen: Soft, non-tender, non-distended with normoactive bowel sounds. No hepatomegaly. No rebound/guarding. No obvious abdominal masses. Msk:  Strength and tone normal for age. Extremities/Skin: Warm and dry. No clubbing or cyanosis. No edema. No rashes or suspicious lesions. Neuro: Alert and oriented X 3. Moves all extremities spontaneously. Gait is normal. CNII-XII grossly in tact. Psych:  Responds to questions appropriately with a normal affect.   Her BP was rechecked in room (laying supine), left arm: 160/110 BP rechecked in room (sitting), left arm: 160/110 BP rechecked in room (standing), left arm: 160/110  Labs:. Results for orders placed or performed in visit on 07/03/15  POCT CBC  Result Value Ref Range   WBC 5.6 4.6 - 10.2 K/uL   Lymph, poc 1.5 0.6 - 3.4   POC LYMPH PERCENT 26.4 10 - 50 %L   MID (cbc) 0.3 0 - 0.9   POC MID % 5.2 0 - 12 %M   POC Granulocyte 3.8 2 - 6.9   Granulocyte percent 68.4 37 - 80 %G   RBC 4.98 4.04 - 5.48 M/uL   Hemoglobin 14.7 12.2 - 16.2 g/dL   HCT, POC 44.1 37.7 - 47.9 %   MCV 88.5 80 - 97 fL   MCH, POC 29.6 27 - 31.2 pg   MCHC 33.4 31.8 - 35.4 g/dL   RDW, POC 13.7 %   Platelet Count, POC 184 142 - 424 K/uL   MPV 8.2 0 - 99.8 fL  POCT glucose (manual entry)  Result Value Ref Range   POC Glucose 93 70 - 99 mg/dl  POCT urinalysis dipstick  Result Value Ref Range   Color, UA yellow yellow   Clarity, UA clear clear   Glucose, UA negative negative   Bilirubin, UA negative negative   Ketones, POC UA negative negative   Spec Grav, UA 1.015    Blood, UA trace-lysed (A) negative   pH, UA 7.0    Protein Ur, POC negative negative   Urobilinogen, UA 0.2     Nitrite, UA Negative Negative   Leukocytes, UA Negative Negative  POCT Microscopic Urinalysis ([facility])  Result Value Ref Range   WBC,UR,HPF,POC Few (A) None WBC/hpf   RBC,UR,HPF,POC None None RBC/hpf   Bacteria None None   Mucus Absent Absent   Epithelial Cells, UR Per Microscopy Few (A) None cells/hpf      ASSESSMENT AND PLAN:  69 y.o. year old female with dizziness and uncontrolled hypertension This chart was scribed in my presence and reviewed by me personally.    ICD-9-CM ICD-10-CM   1. Dizziness and giddiness 780.4 R42 POCT CBC     POCT glucose (manual entry)     POCT urinalysis dipstick     POCT Microscopic Urinalysis ([facility])     meclizine (ANTIVERT) 12.5 MG tablet  2. Need for prophylactic vaccination and inoculation against influenza V04.81 Z23 Flu Vaccine QUAD 36+ mos IM  3. Accelerated hypertension  401.0 I10 amLODipine (NORVASC) 5 MG tablet   Continue to monitor blood pressure and return if does not go below 140/90 in a week. Stop the atenolol.   By signing my name below, I, Moises Blood, attest that this documentation has been prepared under the direction and in the presence of Robyn Haber, MD. Electronically Signed: Moises Blood, [place]. 07/03/2015 , 11:32 AM .  Signed, Robyn Haber, MD 07/03/2015 11:32 AM

## 2015-07-03 NOTE — Patient Instructions (Signed)
I am writing for a stronger blood pressure medicine. He may stop the atenolol at this point.  He appeared to have a kind of vertigo. This may be related to the blood pressure being high. I've written for a medicine to help control the dizziness. I will you to rest the rest of the day.  Please return in one week

## 2015-07-22 ENCOUNTER — Encounter: Payer: Self-pay | Admitting: Physician Assistant

## 2015-07-22 ENCOUNTER — Ambulatory Visit (INDEPENDENT_AMBULATORY_CARE_PROVIDER_SITE_OTHER): Payer: Medicare Other | Admitting: Physician Assistant

## 2015-07-22 VITALS — BP 157/77 | HR 75 | Temp 97.7°F | Resp 16 | Ht 63.0 in | Wt 127.0 lb

## 2015-07-22 DIAGNOSIS — I10 Essential (primary) hypertension: Secondary | ICD-10-CM

## 2015-07-22 DIAGNOSIS — E2839 Other primary ovarian failure: Secondary | ICD-10-CM

## 2015-07-22 DIAGNOSIS — Z0000 Encounter for general adult medical examination without abnormal findings: Secondary | ICD-10-CM

## 2015-07-22 DIAGNOSIS — Z23 Encounter for immunization: Secondary | ICD-10-CM | POA: Diagnosis not present

## 2015-07-22 DIAGNOSIS — E7800 Pure hypercholesterolemia, unspecified: Secondary | ICD-10-CM | POA: Diagnosis not present

## 2015-07-22 DIAGNOSIS — R779 Abnormality of plasma protein, unspecified: Secondary | ICD-10-CM

## 2015-07-22 DIAGNOSIS — E8809 Other disorders of plasma-protein metabolism, not elsewhere classified: Secondary | ICD-10-CM

## 2015-07-22 DIAGNOSIS — Z1159 Encounter for screening for other viral diseases: Secondary | ICD-10-CM

## 2015-07-22 DIAGNOSIS — K219 Gastro-esophageal reflux disease without esophagitis: Secondary | ICD-10-CM | POA: Diagnosis not present

## 2015-07-22 LAB — COMPREHENSIVE METABOLIC PANEL
ALBUMIN: 4.8 g/dL (ref 3.6–5.1)
ALT: 31 U/L — ABNORMAL HIGH (ref 6–29)
AST: 30 U/L (ref 10–35)
Alkaline Phosphatase: 75 U/L (ref 33–130)
BUN: 8 mg/dL (ref 7–25)
CALCIUM: 10.5 mg/dL — AB (ref 8.6–10.4)
CHLORIDE: 104 mmol/L (ref 98–110)
CO2: 27 mmol/L (ref 20–31)
Creat: 0.7 mg/dL (ref 0.50–0.99)
Glucose, Bld: 87 mg/dL (ref 65–99)
POTASSIUM: 3.7 mmol/L (ref 3.5–5.3)
Sodium: 141 mmol/L (ref 135–146)
TOTAL PROTEIN: 8.4 g/dL — AB (ref 6.1–8.1)
Total Bilirubin: 0.8 mg/dL (ref 0.2–1.2)

## 2015-07-22 LAB — LIPID PANEL
Cholesterol: 221 mg/dL — ABNORMAL HIGH (ref 125–200)
HDL: 113 mg/dL
LDL Cholesterol: 100 mg/dL
Total CHOL/HDL Ratio: 2 ratio
Triglycerides: 40 mg/dL
VLDL: 8 mg/dL

## 2015-07-22 LAB — HEPATITIS C ANTIBODY: HCV Ab: NEGATIVE

## 2015-07-22 MED ORDER — FAMOTIDINE 20 MG PO TABS: ORAL_TABLET | ORAL | Status: DC

## 2015-07-22 MED ORDER — SIMVASTATIN 40 MG PO TABS: ORAL_TABLET | ORAL | Status: DC

## 2015-07-22 NOTE — Progress Notes (Signed)
Subjective:    Patient ID: Kristy [name], female    DOB: [birth year], 69 y.o.   MRN: [id number]  Chief Complaint  Patient presents with  . Annual Exam    hysterectomy   HPI Patient presents today for her annual wellness check.  Last seen on 07/03/15 by Dr. Joseph Art for dizziness, at which time atenolol was stopped, amlodipine was started along with meclizine, and regular BP monitoring was recommended.   Patient has noticed she is moving a little slower since starting the amlodipine. Doesn't know if it's due to following Dr. Pauletta Browns instructions to slow down, or if it's the medication. Otherwise tolerating medication well.  Has been using meclizine occasionally. Experienced a little dizziness this morning, but thinks this may be d/t not taking her medications yet today. Otherwise no major dizziness episodes.  Patient has not been checking BP at home, states she has been lazy about it.   Reports having some intermittent leg spasms x 5-6 months. Describes them as little twitches, but not cramps. Worse in the left leg than the right. Also having occasional burning pain in left hip, occurs approx. once a month. No known triggers. Uses a pillow under her left hip to help ease the pain. Takes 325 mg acetaminophen when it gets really bad with good relief. Reports drinking plenty of fluids.  Diet going well. Loves vegetables, working on adding more fruits. Was previously walking 2 miles/day, but lost her walking partner a couple months ago. Planning to re-start her routine.   Still taking cetirizine daily for allergies with good relief.   Reflux well controlled on famotidine. Takes twice daily.   Requesting refills on simvastatin and famotidine today.   Patient had a total hysterectomy at age 48 for irregular bleeding. Still has her ovaries.   Review of Systems  Musculoskeletal:       Occasional left hip pain; "burning" in quality; also occasional leg spasms/twitches, left > right    Allergic/Immunologic: Positive for environmental allergies (well-controlled on cetirizine).  Neurological: Positive for dizziness (occasional; no major episodes since last visit). Negative for syncope, light-headedness and headaches.    Patient Active Problem List   Diagnosis Date Noted  . Allergic rhinitis 05/16/2014  . Sleep pattern disturbance 11/23/2013  . Dyspepsia 05/30/2013  . Hypercholesteremia 04/05/2012  . HTN, goal below 140/80 04/05/2012   Family History  Problem Relation Age of Onset  . Heart attack Mother   . Heart disease Mother     Heart attack  . Hypertension Mother   . Stroke Mother   . Stroke Father   . Stroke Sister   . Stroke Brother    Social History   Social History  . Marital Status: Divorced    Spouse Name: N/A  . Number of Children: N/A  . Years of Education: N/A   Occupational History  . Not on file.   Social History Main Topics  . Smoking status: Never Smoker   . Smokeless tobacco: Not on file  . Alcohol Use: No  . Drug Use: No  . Sexual Activity: No   Other Topics Concern  . Not on file   Social History Narrative   Prior to Admission medications   Medication Sig Start Date End Date Taking? Authorizing Provider  amLODipine (NORVASC) 5 MG tablet Take 1 tablet (5 mg total) by mouth daily. 07/03/15  Yes Robyn Haber, MD  cetirizine (ZYRTEC) 10 MG tablet Take 10 mg by mouth daily.   Yes Historical Provider, MD  famotidine (PEPCID) 20 MG tablet TAKE 1 TABLET (20 MG TOTAL) BY MOUTH 2 (TWO) TIMES DAILY. 06/06/15  Yes Chelle Jeffery, PA-C  meclizine (ANTIVERT) 12.5 MG tablet Take 1 tablet (12.5 mg total) by mouth 3 (three) times daily as needed for dizziness. 07/03/15  Yes Robyn Haber, MD  simvastatin (ZOCOR) 40 MG tablet TAKE 1 TABLET AT BEDTIME. PATIENT NEEDS OFFICE VISIT/FASTING LABS FOR ADDITIONAL REFILLS 06/24/15  Yes Tishira R Brewington, PA-C   No Known Allergies    Objective:   Physical Exam  Constitutional: She is oriented  to person, place, and time. She appears well-developed and well-nourished. No distress.  BP 164/85 mmHg  Pulse 81  Temp(Src) 97.7 F (36.5 C)  Resp 16  Ht 5\' 3"  (1.6 m)  Wt 127 lb (57.607 kg)  BMI 22.50 kg/m2  HENT:  Head: Normocephalic and atraumatic.  Eyes: EOM are normal. No scleral icterus.  Neck: Neck supple.  Cardiovascular: Normal rate, regular rhythm, normal heart sounds and intact distal pulses.  Exam reveals no gallop and no friction rub.   No murmur heard. Pulmonary/Chest: Effort normal and breath sounds normal. No respiratory distress. She has no wheezes. She has no rales.  Abdominal: Soft. She exhibits no distension. There is no tenderness.  Lymphadenopathy:    She has no cervical adenopathy.  Neurological: She is alert and oriented to person, place, and time.  Skin: Skin is warm and dry. No rash noted. She is not diaphoretic. No erythema.  Psychiatric: She has a normal mood and affect. Her behavior is normal. Judgment and thought content normal.      Assessment & Plan:  1. Medicare annual wellness visit, subsequent  2. Need for hepatitis C screening test - Hepatitis C antibody  3. Need for pneumococcal vaccination - Pneumococcal conjugate vaccine 13-valent IM  4. HTN, goal below 140/80 - Continue to exercise daily and well-balanced diet low in salt.  - Comprehensive metabolic panel  5. Hypercholesteremia - Continue to walk and eat a well-balanced diet low in saturated fats to reduce plaque formation.  - Lipid panel - simvastatin (ZOCOR) 40 MG tablet; TAKE 1 TABLET AT BEDTIME.  Dispense: 90 tablet; Refill: 3  6. Estrogen deficiency - Schedule bone density scan. Will call patient to schedule.  - DG Bone Density; Future  7. Gastroesophageal reflux disease without esophagitis - Well-controlled on famotidine.  - famotidine (PEPCID) 20 MG tablet; TAKE 1 TABLET (20 MG TOTAL) BY MOUTH 2 (TWO) TIMES DAILY.  Dispense: 180 tablet; Refill: 3

## 2015-07-22 NOTE — Progress Notes (Signed)
Patient ID: Kristy [name], female    DOB: [birth year], 69 y.o.   MRN: [id number]  PCP: Malyn Aytes, PA-C  Chief Complaint  Patient presents with  . 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  Cervical cancer screening: not a candidate due to hysterectomy at age 49 for irregular bleeding. Ovaries remain. Colorectal cancer screening: 04/24/2012, due 04/2017 Breast cancer screening: mammogram 11/2013, due 11/2015 DEXA: at least 3 years ago Immunizations: needs Prevnar-13 to complete pneumococcal vaccination; seasonal influenza, Tdap current.  Last seen on 07/03/15 by Dr. Joseph Art for dizziness, at which time atenolol was stopped, amlodipine was started along with meclizine, and regular BP monitoring was recommended.   Patient has noticed she is moving a little slower since starting the amlodipine. Doesn't know if it's due to following Dr. Pauletta Browns instructions to slow down, or if it's the medication. Otherwise tolerating medication well.   Has been using meclizine occasionally. Experienced a little dizziness this morning, but thinks this may be d/t not taking her medications yet today. Otherwise no major dizziness episodes.  Patient has not been checking BP at home, states she has been lazy about it.   Reports having some intermittent leg spasms x 5-6 months. Describes them as little twitches, but not cramps. Worse in the left leg than the right. Also having occasional burning pain in left hip, occurs approx. once a month. No known triggers. Uses a pillow under her left hip to help ease the pain. Takes 325 mg acetaminophen when it gets really bad with good relief. Reports drinking plenty of fluids.   Diet going well. Loves vegetables, working on adding more fruits. Was previously walking 2 miles/day, but lost her walking partner a couple months ago. Planning to re-start her routine.   Still taking cetirizine daily for allergies with good  relief.   Reflux well controlled on famotidine. Takes twice daily.   Requesting refills on simvastatin and famotidine today.    Patient Active Problem List   Diagnosis Date Noted  . Allergic rhinitis 05/16/2014  . Sleep pattern disturbance 11/23/2013  . Dyspepsia 05/30/2013  . Hypercholesteremia 04/05/2012  . HTN, goal below 140/80 04/05/2012    Past Medical History  Diagnosis Date  . Hypertension   . Hyperlipidemia   . Anemia   . Allergy      Prior to Admission medications   Medication Sig Start Date End Date Taking? Authorizing Provider  amLODipine (NORVASC) 5 MG tablet Take 1 tablet (5 mg total) by mouth daily. 07/03/15  Yes Robyn Haber, MD  cetirizine (ZYRTEC) 10 MG tablet Take 10 mg by mouth daily.   Yes Historical Provider, MD  famotidine (PEPCID) 20 MG tablet TAKE 1 TABLET (20 MG TOTAL) BY MOUTH 2 (TWO) TIMES DAILY. 06/06/15  Yes Berkeley Veldman, PA-C  meclizine (ANTIVERT) 12.5 MG tablet Take 1 tablet (12.5 mg total) by mouth 3 (three) times daily as needed for dizziness. 07/03/15  Yes Robyn Haber, MD  simvastatin (ZOCOR) 40 MG tablet TAKE 1 TABLET AT BEDTIME. PATIENT NEEDS OFFICE VISIT/FASTING LABS FOR ADDITIONAL REFILLS 06/24/15  Yes Tishira R Brewington, PA-C    No Known Allergies  Past Surgical History  Procedure Laterality Date  . Partial hysterectomy    . Tubal ligation    . Abdominal hysterectomy      Family History  Problem Relation Age of Onset  . Heart attack Mother   . Heart disease Mother     Heart  attack  . Hypertension Mother   . Stroke Mother   . Stroke Father   . Stroke Sister   . Stroke Brother     Social History   Social History  . Marital Status: Divorced    Spouse Name: N/A  . Number of Children: N/A  . Years of Education: N/A   Social History Main Topics  . Smoking status: Never Smoker   . Smokeless tobacco: None  . Alcohol Use: No  . Drug Use: No  . Sexual Activity: No   Other Topics Concern  . None   Social  History Narrative       Review of Systems Musculoskeletal:  Occasional left hip pain; "burning" in quality; also occasional leg spasms/twitches, left > right  Allergic/Immunologic: Positive for environmental allergies (well-controlled on cetirizine).  Neurological: Positive for dizziness (occasional; no major episodes since last visit). Negative for syncope, light-headedness and headaches.  ROS OTHERWISE NEGATIVE.     Objective:  Physical Exam  Constitutional: She is oriented to person, place, and time. Vital signs are normal. She appears well-developed and well-nourished. She is active and cooperative. No distress.  BP 157/77 mmHg  Pulse 75  Temp(Src) 97.7 F (36.5 C)  Resp 16  Ht 5\' 3"  (1.6 m)  Wt 127 lb (57.607 kg)  BMI 22.50 kg/m2   HENT:  Head: Normocephalic and atraumatic.  Right Ear: Hearing, tympanic membrane, external ear and ear canal normal. No foreign bodies.  Left Ear: Hearing, tympanic membrane, external ear and ear canal normal. No foreign bodies.  Nose: Nose normal.  Mouth/Throat: Uvula is midline, oropharynx is clear and moist and mucous membranes are normal. No oral lesions. Normal dentition. No dental abscesses or uvula swelling. No oropharyngeal exudate.  Eyes: Conjunctivae, EOM and lids are normal. Pupils are equal, round, and reactive to light. Right eye exhibits no discharge. Left eye exhibits no discharge. No scleral icterus.  Fundoscopic exam:      The right eye shows no arteriolar narrowing, no AV nicking, no exudate, no hemorrhage and no papilledema. The right eye shows red reflex.       The left eye shows no arteriolar narrowing, no AV nicking, no exudate, no hemorrhage and no papilledema. The left eye shows red reflex.  Neck: Trachea normal, normal range of motion and full passive range of motion without pain. Neck supple. No spinous process tenderness and no muscular tenderness present. No thyroid mass and no thyromegaly present.  Cardiovascular:  Normal rate, regular rhythm, normal heart sounds, intact distal pulses and normal pulses.   Pulmonary/Chest: Effort normal and breath sounds normal. Right breast exhibits no inverted nipple, no mass, no nipple discharge, no skin change and no tenderness. Left breast exhibits no inverted nipple, no mass, no nipple discharge, no skin change and no tenderness. Breasts are symmetrical.  Musculoskeletal: She exhibits no edema or tenderness.       Cervical back: Normal.       Thoracic back: Normal.       Lumbar back: Normal.  Lymphadenopathy:       Head (right side): No tonsillar, no preauricular, no posterior auricular and no occipital adenopathy present.       Head (left side): No tonsillar, no preauricular, no posterior auricular and no occipital adenopathy present.    She has no cervical adenopathy.       Right: No supraclavicular adenopathy present.       Left: No supraclavicular adenopathy present.  Neurological: She is alert and oriented to  person, place, and time. She has normal strength and normal reflexes. No cranial nerve deficit. She exhibits normal muscle tone. Coordination and gait normal.  Skin: Skin is warm, dry and intact. No rash noted. She is not diaphoretic. No cyanosis or erythema. Nails show no clubbing.  Psychiatric: She has a normal mood and affect. Her speech is normal and behavior is normal. Judgment and thought content normal.           Assessment & Plan:  1. Medicare annual wellness visit, subsequent Age appropriate anticipatory guidance provided.  2. Need for hepatitis C screening test - Hepatitis C antibody  3. Need for pneumococcal vaccination - Pneumococcal conjugate vaccine 13-valent IM  4. HTN, goal below 140/80 On recheck, BP remains elevated, but at home it has been normal in the past, elevated only here. Asked her to monitor BP at home, 1-2 times each week and bring in the log in 3 months for review. - Comprehensive metabolic panel  5.  Hypercholesteremia Await lab results. - Lipid panel - simvastatin (ZOCOR) 40 MG tablet; TAKE 1 TABLET AT BEDTIME.  Dispense: 90 tablet; Refill: 3  6. Estrogen deficiency Update DEXA. Resume regular weightbearing exercise and daily calcium + vitamin D. - DG Bone Density; Future  7. Gastroesophageal reflux disease without esophagitis Stable/Controlled. - famotidine (PEPCID) 20 MG tablet; TAKE 1 TABLET (20 MG TOTAL) BY MOUTH 2 (TWO) TIMES DAILY.  Dispense: 180 tablet; Refill: 3   Return in about 3 months (around 10/22/2015) for re-evaluation of blood pressure.   Fara Chute, PA-C Physician Assistant-Certified [facility]

## 2015-07-22 NOTE — Patient Instructions (Addendum)
I will contact you with your lab results as soon as they are available.   If you have not heard from me in 2 weeks, please contact me.  The fastest way to get your results is to register for My Chart (see the instructions on the last page of this printout).  Find a new walking partner!  Resume checking your blood pressure at home, 1-2 times each week. Record the results and bring them in for me to review in three months.  Keeping You Healthy  Get These Tests  Blood Pressure- Have your blood pressure checked by your healthcare provider at least once a year.  Normal blood pressure is 120/80.  Weight- Have your body mass index (BMI) calculated to screen for obesity.  BMI is a measure of body fat based on height and weight.  You can calculate your own BMI at [employer]  Cholesterol- Have your cholesterol checked every year.  Diabetes- Have your blood sugar checked every year if you have high blood pressure, high cholesterol, a family history of diabetes or if you are overweight.  Pap Test - Have a pap test every 1 to 5 years if you have been sexually active.  If you are older than 65 and recent pap tests have been normal you may not need additional pap tests.  In addition, if you have had a hysterectomy  for benign disease additional pap tests are not necessary.  Mammogram-Yearly mammograms are essential for early detection of breast cancer  Screening for Colon Cancer- Colonoscopy starting at age 61. Screening may begin sooner depending on your family history and other health conditions.  Follow up colonoscopy as directed by your Gastroenterologist.  Screening for Osteoporosis- Screening begins at age 5 with bone density scanning, sooner if you are at higher risk for developing Osteoporosis.  Get these medicines  Calcium with Vitamin D- Your body requires 1200-1500 mg of Calcium a day and [phone] IU of Vitamin D a day.  You can only absorb 500 mg of Calcium at a time  therefore Calcium must be taken in 2 or 3 separate doses throughout the day.  Hormones- Hormone therapy has been associated with increased risk for certain cancers and heart disease.  Talk to your healthcare provider about if you need relief from menopausal symptoms.  Aspirin- Ask your healthcare provider about taking Aspirin to prevent Heart Disease and Stroke.  Get these Immuniztions  Flu shot- Every fall  Pneumonia shot- Once after the age of 65; if you are younger ask your healthcare provider if you need a pneumonia shot.  Tetanus- Every ten years.  Zostavax- Once after the age of 9 to prevent shingles.  Take these steps  Don't smoke- Your healthcare provider can help you quit. For tips on how to quit, ask your healthcare provider or go to www.smokefree.gov or call 1-800 QUIT-NOW.  Be physically active- Exercise 5 days a week for a minimum of 30 minutes.  If you are not already physically active, start slow and gradually work up to 30 minutes of moderate physical activity.  Try walking, dancing, bike riding, swimming, etc.  Eat a healthy diet- Eat a variety of healthy foods such as fruits, vegetables, whole grains, low fat milk, low fat cheeses, yogurt, lean meats, chicken, fish, eggs, dried beans, tofu, etc.  For more information go to www.thenutritionsource.org  Dental visit- Brush and floss teeth twice daily; visit your dentist twice a year.  Eye exam- Visit your Optometrist or Ophthalmologist yearly.  Drink alcohol  in moderation- Limit alcohol intake to one drink or less a day.  Never drink and drive.  Depression- Your emotional health is as important as your physical health.  If you're feeling down or losing interest in things you normally enjoy, please talk to your healthcare provider.  Seat Belts- can save your life; always wear one  Smoke/Carbon Monoxide detectors- These detectors need to be installed on the appropriate level of your home.  Replace batteries at least  once a year.  Violence- If anyone is threatening or hurting you, please tell your healthcare provider.  Living Will/ Health care power of attorney- Discuss with your healthcare provider and family.

## 2015-08-05 NOTE — Addendum Note (Signed)
Addended by: Fara Chute on: 08/05/2015 11:14 AM   Modules accepted: Orders

## 2015-08-06 ENCOUNTER — Other Ambulatory Visit: Payer: Self-pay | Admitting: Physician Assistant

## 2015-08-07 ENCOUNTER — Other Ambulatory Visit (INDEPENDENT_AMBULATORY_CARE_PROVIDER_SITE_OTHER): Payer: Medicare Other

## 2015-08-07 ENCOUNTER — Other Ambulatory Visit: Payer: Self-pay | Admitting: Physician Assistant

## 2015-08-07 DIAGNOSIS — E8809 Other disorders of plasma-protein metabolism, not elsewhere classified: Secondary | ICD-10-CM

## 2015-08-12 LAB — PROTEIN ELECTROPHORESIS, SERUM, WITH REFLEX
ALBUMIN ELP: 4.7 g/dL (ref 3.8–4.8)
ALPHA-1-GLOBULIN: 0.3 g/dL (ref 0.2–0.3)
Alpha-2-Globulin: 0.7 g/dL (ref 0.5–0.9)
BETA GLOBULIN: 0.5 g/dL (ref 0.4–0.6)
Beta 2: 0.3 g/dL (ref 0.2–0.5)
Gamma Globulin: 1.3 g/dL (ref 0.8–1.7)
Total Protein, Serum Electrophoresis: 7.9 g/dL (ref 6.1–8.1)

## 2015-09-02 ENCOUNTER — Ambulatory Visit
Admission: RE | Admit: 2015-09-02 | Discharge: 2015-09-02 | Disposition: A | Discharge status: Home / Self Care | Payer: Medicare Other | Source: Ambulatory Visit | Attending: Physician Assistant | Admitting: Physician Assistant

## 2015-09-02 DIAGNOSIS — M85852 Other specified disorders of bone density and structure, left thigh: Secondary | ICD-10-CM | POA: Diagnosis not present

## 2015-09-02 DIAGNOSIS — E2839 Other primary ovarian failure: Secondary | ICD-10-CM

## 2015-10-28 ENCOUNTER — Ambulatory Visit (INDEPENDENT_AMBULATORY_CARE_PROVIDER_SITE_OTHER): Payer: Medicare Other | Admitting: Physician Assistant

## 2015-10-28 ENCOUNTER — Encounter: Payer: Self-pay | Admitting: Physician Assistant

## 2015-10-28 VITALS — BP 123/79 | HR 74 | Temp 98.0°F | Resp 16 | Wt 128.4 lb

## 2015-10-28 DIAGNOSIS — E7800 Pure hypercholesterolemia, unspecified: Secondary | ICD-10-CM

## 2015-10-28 DIAGNOSIS — R1013 Epigastric pain: Secondary | ICD-10-CM | POA: Diagnosis not present

## 2015-10-28 DIAGNOSIS — I10 Essential (primary) hypertension: Secondary | ICD-10-CM | POA: Diagnosis not present

## 2015-10-28 NOTE — Progress Notes (Signed)
Patient ID: Kristy [name], female    DOB: 1945/11/11, 70 y.o.   MRN: [id number]  PCP: Wynne Dust  Subjective:   Chief Complaint  Patient presents with  . Hypertension    HPI Presents for evaluation of HTN and dyspepsia.  Generally, feels great.   Famotidine wears off in the afternoond/evenings. Experimented with the time of day that she takes it, but often still has breakthrough.  Review of Systems  Constitutional: Negative.   HENT: Negative.   Eyes: Negative.   Respiratory: Negative.   Cardiovascular: Negative.   Gastrointestinal: Negative for nausea, vomiting, abdominal pain, diarrhea, constipation and blood in stool.       Breakthrough "sour belching"  Endocrine: Negative.   Genitourinary: Negative.   Musculoskeletal: Negative.   Skin: Negative.   Allergic/Immunologic: Negative.   Neurological: Negative.   Hematological: Negative.   Psychiatric/Behavioral: Negative.        Patient Active Problem List   Diagnosis Date Noted  . Allergic rhinitis 05/16/2014  . Sleep pattern disturbance 11/23/2013  . Dyspepsia 05/30/2013  . Hypercholesteremia 04/05/2012  . HTN, goal below 140/80 04/05/2012     Prior to Admission medications   Medication Sig Start Date End Date Taking? Authorizing Provider  amLODipine (NORVASC) 5 MG tablet Take 1 tablet (5 mg total) by mouth daily. 07/03/15  Yes Robyn Haber, MD  cetirizine (ZYRTEC) 10 MG tablet Take 10 mg by mouth daily.   Yes Historical Provider, MD  famotidine (PEPCID) 20 MG tablet TAKE 1 TABLET (20 MG TOTAL) BY MOUTH 2 (TWO) TIMES DAILY 08/06/15  Yes Kimberley Dastrup, PA-C  meclizine (ANTIVERT) 12.5 MG tablet Take 1 tablet (12.5 mg total) by mouth 3 (three) times daily as needed for dizziness. 07/03/15  Yes Robyn Haber, MD  simvastatin (ZOCOR) 40 MG tablet TAKE 1 TABLET AT BEDTIME. 07/22/15  Yes Ndrew Creason, PA-C     No Known Allergies     Objective:  Physical Exam  Constitutional: She is oriented  to person, place, and time. She appears well-developed and well-nourished. She is active and cooperative. No distress.  BP 123/79 mmHg  Pulse 74  Temp(Src) 98 F (36.7 C) (Oral)  Resp 16  Wt 128 lb 6.4 oz (58.242 kg)  HENT:  Head: Normocephalic and atraumatic.  Right Ear: Hearing normal.  Left Ear: Hearing normal.  Eyes: Conjunctivae are normal. No scleral icterus.  Neck: Normal range of motion. Neck supple. No thyromegaly present.  Cardiovascular: Normal rate, regular rhythm and normal heart sounds.   Pulses:      Radial pulses are 2+ on the right side, and 2+ on the left side.  Pulmonary/Chest: Effort normal and breath sounds normal.  Lymphadenopathy:       Head (right side): No tonsillar, no preauricular, no posterior auricular and no occipital adenopathy present.       Head (left side): No tonsillar, no preauricular, no posterior auricular and no occipital adenopathy present.    She has no cervical adenopathy.       Right: No supraclavicular adenopathy present.       Left: No supraclavicular adenopathy present.  Neurological: She is alert and oriented to person, place, and time. No sensory deficit.  Skin: Skin is warm, dry and intact. No rash noted. No cyanosis or erythema. Nails show no clubbing.  Psychiatric: She has a normal mood and affect. Her speech is normal and behavior is normal.           Assessment & Plan:   1.  HTN, goal below 140/80 Continue current regimen. Home readings remain well controlled without readings >140/90.   2. Hypercholesteremia Continue healthy eating and regular physical activity. Continue simvastatin.  3. Dyspepsia Increase famotidine to TID to see if that prevents breakthrough symptoms.  Return in about 6 months (around 04/26/2016).    Fara Chute, PA-C Physician Assistant-Certified [facility]

## 2015-12-27 ENCOUNTER — Telehealth: Payer: Self-pay | Admitting: Physician Assistant

## 2015-12-27 NOTE — Telephone Encounter (Signed)
Please call this patient. Amlodipine and simvastatin (at doses above 20 mg) are no longer recommended because the combination can increase the effects of the simvastatin. At her last visit she was doing well, and if she is still, she can continue this until our next visit. Remind her to be checking her BP weekly and recording the readings so that I can review them at her next visit. If we can stop the amlodipine, we will.

## 2015-12-29 ENCOUNTER — Telehealth: Payer: Self-pay | Admitting: Family Medicine

## 2015-12-29 NOTE — Telephone Encounter (Signed)
Informed pt to continue taking her meds as directed. She is keeping a log of her BP and will bring those in at next visit.

## 2015-12-31 NOTE — Telephone Encounter (Signed)
Michelle spoke with pt.

## 2016-04-05 ENCOUNTER — Ambulatory Visit (INDEPENDENT_AMBULATORY_CARE_PROVIDER_SITE_OTHER): Payer: Medicare Other | Admitting: Family Medicine

## 2016-04-05 VITALS — BP 172/94 | HR 85 | Temp 98.3°F | Resp 17 | Ht 63.5 in | Wt 130.0 lb

## 2016-04-05 DIAGNOSIS — Z8679 Personal history of other diseases of the circulatory system: Secondary | ICD-10-CM

## 2016-04-05 DIAGNOSIS — I10 Essential (primary) hypertension: Secondary | ICD-10-CM | POA: Diagnosis not present

## 2016-04-05 MED ORDER — HYDROCHLOROTHIAZIDE 12.5 MG PO CAPS: 12.5000 mg | ORAL_CAPSULE | Freq: Every day | ORAL | Status: DC

## 2016-04-05 NOTE — Progress Notes (Signed)
Patient ID: Kristy [name], female    DOB: Feb 17, 1946  Age: 71 y.o. MRN: [id number]  Chief Complaint  Patient presents with  . Hypertension    recheck    Subjective:   Pleasant lady with history of high blood pressure treated with amlodipine. She has been feeling fairly well. She has a blood pressure cuff that she uses on her wrist. If red. Her heart rate was irregularly yesterday and that concerned her. She started getting some tingling in the right side of her face and right arm and got worried so she came in here today. She brought with her a listing of her regular blood pressure reading. Typically she runs a fairly borderline in the upper 130s to low 0000000 systolic and [id number] to 0000000 diastolic. She says one time she was tried on a higher dose of blood pressure medicine and it made her swimmy headed. She denies ankle edema from amlodipine. She is retired, stays busy taking care of grandchildren. She used to walk regularly but has not been doing that for some time. Her weight is gradually going up for the last 2 years about 20 pounds. Did discuss that.  Current allergies, medications, problem list, past/family and social histories reviewed.  Objective:  BP 172/94 mmHg  Pulse 85  Temp(Src) 98.3 F (36.8 C) (Oral)  Resp 17  Ht 5' 3.5" (1.613 m)  Wt 130 lb (58.968 kg)  BMI 22.66 kg/m2  SpO2 99%  No major acute distress. Chest clear. Heart regular without murmur. Blood pressure as noted.  Assessment & Plan:   Assessment: 1. History of irregular heartbeat   2. HTN (hypertension), benign       Plan: Check EKG. Will add hydrochlorothiazide. Urged her to get more regular exercise and try to eat less to lose down to under 10/09/2008. Keep her appointment with Chelle next week.The irregular heartbeats that the machine read may have been factitious. .  Orders Placed This Encounter  Procedures  . EKG 12-Lead    Meds ordered this encounter  Medications  . Multiple Vitamins-Minerals  (MULTIVITAMIN WITH MINERALS) tablet    Sig: Take 1 tablet by mouth daily.  . hydrochlorothiazide (MICROZIDE) 12.5 MG capsule    Sig: Take 1 capsule (12.5 mg total) by mouth daily.    Dispense:  90 capsule    Refill:  1         Patient Instructions  Continue current medications  Add hydrochlorothiazide 12.5 mg 1 each morning  Return if symptoms  Try to get more regular exercise with walking.  It lasted try and lose a few pounds  Keep your regular annual exam appointment next month     No Follow-up on file.   Willamina Grieshop, MD 04/05/2016

## 2016-04-05 NOTE — Patient Instructions (Signed)
Continue current medications  Add hydrochlorothiazide 12.5 mg 1 each morning  Return if symptoms  Try to get more regular exercise with walking.  It lasted try and lose a few pounds  Keep your regular annual exam appointment next month

## 2016-04-27 ENCOUNTER — Ambulatory Visit: Payer: Medicare Other | Admitting: Physician Assistant

## 2016-06-13 ENCOUNTER — Other Ambulatory Visit: Payer: Self-pay | Admitting: Family Medicine

## 2016-06-13 DIAGNOSIS — I10 Essential (primary) hypertension: Secondary | ICD-10-CM

## 2016-06-15 ENCOUNTER — Telehealth: Payer: Self-pay

## 2016-06-15 NOTE — Telephone Encounter (Signed)
Spoke with pt. Advised she was given 1 refill to last until her visit with Chelle in November 2017.  She states she will keep that appointment.

## 2016-07-19 DIAGNOSIS — Z23 Encounter for immunization: Secondary | ICD-10-CM | POA: Diagnosis not present

## 2016-07-30 ENCOUNTER — Other Ambulatory Visit: Payer: Self-pay | Admitting: Physician Assistant

## 2016-07-30 DIAGNOSIS — E7800 Pure hypercholesterolemia, unspecified: Secondary | ICD-10-CM

## 2016-08-03 ENCOUNTER — Ambulatory Visit (INDEPENDENT_AMBULATORY_CARE_PROVIDER_SITE_OTHER): Payer: Medicare Other | Admitting: Physician Assistant

## 2016-08-03 ENCOUNTER — Encounter: Payer: Self-pay | Admitting: Physician Assistant

## 2016-08-03 VITALS — BP 120/72 | HR 108 | Temp 97.7°F | Resp 18 | Ht 63.5 in | Wt 128.8 lb

## 2016-08-03 DIAGNOSIS — I10 Essential (primary) hypertension: Secondary | ICD-10-CM

## 2016-08-03 DIAGNOSIS — Z0000 Encounter for general adult medical examination without abnormal findings: Secondary | ICD-10-CM | POA: Diagnosis not present

## 2016-08-03 DIAGNOSIS — M25511 Pain in right shoulder: Secondary | ICD-10-CM

## 2016-08-03 LAB — CBC WITH DIFFERENTIAL/PLATELET
BASOS ABS: 57 {cells}/uL (ref 0–200)
BASOS PCT: 1 %
EOS ABS: 57 {cells}/uL (ref 15–500)
Eosinophils Relative: 1 %
HEMATOCRIT: 39 % (ref 35.0–45.0)
Hemoglobin: 13.2 g/dL (ref 11.7–15.5)
LYMPHS PCT: 28 %
Lymphs Abs: 1596 cells/uL (ref 850–3900)
MCH: 30.1 pg (ref 27.0–33.0)
MCHC: 33.8 g/dL (ref 32.0–36.0)
MCV: 89 fL (ref 80.0–100.0)
MONO ABS: 570 {cells}/uL (ref 200–950)
MONOS PCT: 10 %
MPV: 10.3 fL (ref 7.5–12.5)
Neutro Abs: 3420 cells/uL (ref 1500–7800)
Neutrophils Relative %: 60 %
Platelets: 275 10*3/uL (ref 140–400)
RBC: 4.38 MIL/uL (ref 3.80–5.10)
RDW: 14.4 % (ref 11.0–15.0)
WBC: 5.7 10*3/uL (ref 3.8–10.8)

## 2016-08-03 LAB — COMPREHENSIVE METABOLIC PANEL
ALK PHOS: 63 U/L (ref 33–130)
ALT: 19 U/L (ref 6–29)
AST: 24 U/L (ref 10–35)
Albumin: 4.7 g/dL (ref 3.6–5.1)
BUN: 9 mg/dL (ref 7–25)
CALCIUM: 10.3 mg/dL (ref 8.6–10.4)
CHLORIDE: 106 mmol/L (ref 98–110)
CO2: 25 mmol/L (ref 20–31)
Creat: 0.79 mg/dL (ref 0.50–0.99)
Glucose, Bld: 102 mg/dL — ABNORMAL HIGH (ref 65–99)
POTASSIUM: 4.3 mmol/L (ref 3.5–5.3)
Sodium: 143 mmol/L (ref 135–146)
TOTAL PROTEIN: 8 g/dL (ref 6.1–8.1)
Total Bilirubin: 0.6 mg/dL (ref 0.2–1.2)

## 2016-08-03 LAB — URINALYSIS, MICROSCOPIC ONLY
BACTERIA UA: NONE SEEN [HPF]
CRYSTALS: NONE SEEN [HPF]
Casts: NONE SEEN [LPF]
RBC / HPF: NONE SEEN RBC/HPF (ref <=2)
SQUAMOUS EPITHELIAL / LPF: NONE SEEN [HPF] (ref <=5)
Yeast: NONE SEEN [HPF]

## 2016-08-03 NOTE — Progress Notes (Signed)
Presents today for [employer] Visit-Subsequent.   Date of last exam: 10/28/2015 for OV, AWV 07/22/2015  Interpreter used for this visit? no  Patient Care Team: Harrison Mons, PA-C as PCP - General (Family Medicine)   Other items to address today: RIGHT shoulder pain with certain activities. Uses OTC acetaminophen PRN with good results. Not interested in additional evaluation/treatment at this time.   Cancer Screening: Cervical: No longer a candidate Breast: last done 11/2013 Colon: yes  Prostate: n/a   Other Screening: Last screening for diabetes: 07/2015, normal Last lipid screening: 07/2015, normal   ADVANCE DIRECTIVES: Discussed: yes On File: no Materials Provided: yes   Immunization status: up to date and documented.  Home Environment: Lives in her own home, [place]. Her son lives with her.    Patient Active Problem List   Diagnosis Date Noted  . Allergic rhinitis 05/16/2014  . Sleep pattern disturbance 11/23/2013  . Dyspepsia 05/30/2013  . Hypercholesteremia 04/05/2012  . HTN, goal below 140/80 04/05/2012     Past Medical History:  Diagnosis Date  . Allergy   . Anemia   . Hyperlipidemia   . Hypertension      Past Surgical History:  Procedure Laterality Date  . ABDOMINAL HYSTERECTOMY    . PARTIAL HYSTERECTOMY    . TUBAL LIGATION       Family History  Problem Relation Age of Onset  . Heart attack Mother   . Heart disease Mother     Heart attack  . Hypertension Mother   . Stroke Mother   . Stroke Father   . Stroke Sister   . Stroke Brother      Social History   Social History  . Marital status: Divorced    Spouse name: n/a  . Number of children: 2  . Years of education: N/A   Occupational History  . retired [employer]    Social History Main Topics  . Smoking status: Never Smoker  . Smokeless tobacco: Never Used  . Alcohol use No  . Drug use: No  . Sexual activity: No   Other Topics  Concern  . Not on file   Social History Narrative   Her son lives with her.     No Known Allergies   Prior to Admission medications   Medication Sig Start Date End Date Taking? Authorizing Provider  amLODipine (NORVASC) 5 MG tablet TAKE 1 TABLET BY MOUTH EVERY DAY 06/15/16  Yes Harveer Sadler, PA-C  cetirizine (ZYRTEC) 10 MG tablet Take 10 mg by mouth daily.   Yes Historical Provider, MD  famotidine (PEPCID) 20 MG tablet TAKE 1 TABLET (20 MG TOTAL) BY MOUTH 2 (TWO) TIMES DAILY 08/06/15  Yes Londyn Wotton, PA-C  hydrochlorothiazide (MICROZIDE) 12.5 MG capsule Take 1 capsule (12.5 mg total) by mouth daily. 04/05/16  Yes Posey Boyer, MD  meclizine (ANTIVERT) 12.5 MG tablet Take 1 tablet (12.5 mg total) by mouth 3 (three) times daily as needed for dizziness. 07/03/15  Yes Robyn Haber, MD  Multiple Vitamins-Minerals (MULTIVITAMIN WITH MINERALS) tablet Take 1 tablet by mouth daily.   Yes Historical Provider, MD  simvastatin (ZOCOR) 40 MG tablet TAKE 1 TABLET AT BEDTIME. 08/02/16  Yes Trenisha Lafavor, PA-C     Depression screen [facility] 2/9 08/03/2016 04/05/2016 10/28/2015 07/22/2015 07/03/2015  Decreased Interest 0 0 0 0 0  Down, Depressed, Hopeless 0 0 0 0 0  PHQ - 2 Score 0 0 0 0 0     Fall  Risk  08/03/2016 04/05/2016 10/28/2015 07/22/2015 11/21/2014  Falls in the past year? No No No No No     Functional Status Survey: Is the patient deaf or have difficulty hearing?: No Does the patient have difficulty seeing, even when wearing glasses/contacts?: No Does the patient have difficulty concentrating, remembering, or making decisions?: No Does the patient have difficulty walking or climbing stairs?: No Does the patient have difficulty dressing or bathing?: No Does the patient have difficulty doing errands alone such as visiting a doctor's office or shopping?: No     Evaluation of Cognitive Function: Mood/affect: cheerful, appropriate  Appearance: well groomed Family Member/caregiver  input: none    PHYSICAL EXAM: BP 120/72   Pulse (!) 108   Temp 97.7 F (36.5 C) (Oral)   Resp 18   Ht 5' 3.5" (1.613 m)   Wt 128 lb 12.8 oz (58.4 kg)   SpO2 100%   BMI 22.46 kg/m    Wt Readings from Last 3 Encounters:  08/03/16 128 lb 12.8 oz (58.4 kg)  04/05/16 130 lb (59 kg)  10/28/15 128 lb 6.4 oz (58.2 kg)     Visual Acuity Screening   Right eye Left eye Both eyes  Without correction:     With correction: 20/25 20/20-1 20/20    Physical Exam  Constitutional: She is oriented to person, place, and time. She appears well-developed and well-nourished. She is active and cooperative. No distress.  HENT:  Head: Normocephalic and atraumatic.  Right Ear: Hearing, tympanic membrane, external ear and ear canal normal.  Left Ear: Hearing, tympanic membrane, external ear and ear canal normal.  Nose: Nose normal.  Mouth/Throat: Uvula is midline, oropharynx is clear and moist and mucous membranes are normal. No oral lesions. No uvula swelling. No oropharyngeal exudate.  Eyes: Conjunctivae, EOM and lids are normal. Pupils are equal, round, and reactive to light. Right eye exhibits no discharge. Left eye exhibits no discharge. No scleral icterus.  Fundoscopic exam:      The right eye shows no hemorrhage and no papilledema. The right eye shows red reflex.       The left eye shows no hemorrhage and no papilledema. The left eye shows red reflex.  Neck: Normal range of motion, full passive range of motion without pain and phonation normal. Neck supple. No thyromegaly present.  Cardiovascular: Regular rhythm, normal heart sounds and intact distal pulses.  Tachycardia present.  Exam reveals no gallop and no friction rub.   No murmur heard. Heart rate is normal by home readings, 60's-70's  Respiratory: Effort normal and breath sounds normal.  GI: Soft. Normal appearance and bowel sounds are normal. There is no hepatosplenomegaly. There is no tenderness.  Musculoskeletal:       Cervical  back: Normal.       Thoracic back: Normal.       Lumbar back: Normal.  Lymphadenopathy:       Head (right side): No submandibular and no tonsillar adenopathy present.       Head (left side): No submandibular and no tonsillar adenopathy present.    She has no cervical adenopathy.       Right: No supraclavicular adenopathy present.       Left: No supraclavicular adenopathy present.  Neurological: She is alert and oriented to person, place, and time. She has normal strength. No cranial nerve deficit or sensory deficit.  Skin: Skin is warm and dry. No rash noted. She is not diaphoretic.  Psychiatric: She has a normal mood and  affect. Her speech is normal and behavior is normal. Judgment and thought content normal. Cognition and memory are normal.      Education/Counseling: yes diet and exercise yes prevention of chronic diseases yes smoking/tobacco cessation yes review "Covered Medicare Preventive Services"    ASSESSMENT/PLAN:  1. Medicare annual wellness visit, subsequent Age appropriate anticipatory guidance provided.  2. HTN, goal below 140/80 Controlled. - CBC with Differential/Platelet - Comprehensive metabolic panel - Urine Microscopic  3. Right shoulder pain, unspecified chronicity Continue acetaminophen PRN.   Fara Chute, PA-C Physician Assistant-Certified [facility]

## 2016-08-03 NOTE — Progress Notes (Signed)
Presents today for [employer] Visit-Subsequent.   Date of last exam: 07/31/2015  Interpreter used for this visit? No  Patient Care Team: Harrison Mons, PA-C as PCP - General (Family Medicine)   Other items to address today: Noticed some aching pain in her right shoulder. Been using heating back and Acetaminophen with some relief at home. Denies swelling, erythema, or warmth. Denies history of trauma or injury to the shoulder.  Endorses dental problem, states she needs to go to dentist to have her crown checked. Denies going to dentist regularly but plans to schedule visit for this.  Some rhinorrhea with seasonal allergies, she takes Cetirizine as needed which provides relief.  Cancer Screening: Cervical: Not a candidate, hysterectomy at age 33 for irregular bleeding Breast: Last 11/26/2013, recommended to get another one this year Colon: 04/24/2012  Other Screening: Last screening for diabetes: 07/22/2015 Last lipid screening: 07/22/2015   ADVANCE DIRECTIVES: Discussed: Yes, discussed with children but no official or legal paperwork On File: N/A  Materials Provided: Yes  Immunization status: UTD  Home Environment: Lives in her one-story home with her son. She cooks, cleans, performs ADLs on her own without problems.    Patient Active Problem List   Diagnosis Date Noted  . Allergic rhinitis 05/16/2014  . Sleep pattern disturbance 11/23/2013  . Dyspepsia 05/30/2013  . Hypercholesteremia 04/05/2012  . HTN, goal below 140/80 04/05/2012     Past Medical History:  Diagnosis Date  . Allergy   . Anemia   . Hyperlipidemia   . Hypertension      Past Surgical History:  Procedure Laterality Date  . ABDOMINAL HYSTERECTOMY    . PARTIAL HYSTERECTOMY    . TUBAL LIGATION       Family History  Problem Relation Age of Onset  . Heart attack Mother   . Heart disease Mother     Heart attack  . Hypertension Mother   . Stroke Mother   . Stroke Father   .  Stroke Sister   . Stroke Brother      Social History   Social History  . Marital status: Divorced    Spouse name: n/a  . Number of children: 2  . Years of education: N/A   Occupational History  . retired [employer]    Social History Main Topics  . Smoking status: Never Smoker  . Smokeless tobacco: Never Used  . Alcohol use No  . Drug use: No  . Sexual activity: No   Other Topics Concern  . Not on file   Social History Narrative   Her son lives with her.     No Known Allergies   Prior to Admission medications   Medication Sig Start Date End Date Taking? Authorizing Provider  amLODipine (NORVASC) 5 MG tablet TAKE 1 TABLET BY MOUTH EVERY DAY 06/15/16  Yes Chelle Jeffery, PA-C  cetirizine (ZYRTEC) 10 MG tablet Take 10 mg by mouth daily.   Yes Historical Provider, MD  famotidine (PEPCID) 20 MG tablet TAKE 1 TABLET (20 MG TOTAL) BY MOUTH 2 (TWO) TIMES DAILY 08/06/15  Yes Chelle Jeffery, PA-C  hydrochlorothiazide (MICROZIDE) 12.5 MG capsule Take 1 capsule (12.5 mg total) by mouth daily. 04/05/16  Yes Posey Boyer, MD  meclizine (ANTIVERT) 12.5 MG tablet Take 1 tablet (12.5 mg total) by mouth 3 (three) times daily as needed for dizziness. 07/03/15  Yes Robyn Haber, MD  Multiple Vitamins-Minerals (MULTIVITAMIN WITH MINERALS) tablet Take 1 tablet by mouth daily.  Yes Historical Provider, MD  simvastatin (ZOCOR) 40 MG tablet TAKE 1 TABLET AT BEDTIME. 08/02/16  Yes Chelle Jeffery, PA-C     Depression screen [facility] 2/9 08/03/2016 04/05/2016 10/28/2015 07/22/2015 07/03/2015  Decreased Interest 0 0 0 0 0  Down, Depressed, Hopeless 0 0 0 0 0  PHQ - 2 Score 0 0 0 0 0     Fall Risk  08/03/2016 04/05/2016 10/28/2015 07/22/2015 11/21/2014  Falls in the past year? No No No No No     Functional Status Survey:  Functional Status Survey: Is the patient deaf or have difficulty hearing?: No Does the patient have difficulty seeing, even when wearing glasses/contacts?:  No Does the patient have difficulty concentrating, remembering, or making decisions?: No Does the patient have difficulty walking or climbing stairs?: No Does the patient have difficulty dressing or bathing?: No Does the patient have difficulty doing errands alone such as visiting a doctor's office or shopping?: No  Evaluation of Cognitive Function: Mood/affect: Pleasant and cooperative Appearance: Well-groomed Family Member/caregiver input: No family member present with the patient today   PHYSICAL EXAM: BP 120/72   Pulse (!) 108   Temp 97.7 F (36.5 C) (Oral)   Resp 18   Ht 5' 3.5" (1.613 m)   Wt 128 lb 12.8 oz (58.4 kg)   SpO2 100%   BMI 22.46 kg/m    Wt Readings from Last 3 Encounters:  08/03/16 128 lb 12.8 oz (58.4 kg)  04/05/16 130 lb (59 kg)  10/28/15 128 lb 6.4 oz (58.2 kg)       Visual Acuity Screening   Right eye Left eye Both eyes  Without correction:     With correction: 20/25 20/20-1 20/20      Physical Exam  Constitutional: She is oriented to person, place, and time. She appears well-developed and well-nourished. No distress.  HENT:  Head: Normocephalic and atraumatic.  Right Ear: External ear normal. No drainage, swelling or tenderness.  Left Ear: External ear normal. No drainage, swelling or tenderness. No mastoid tenderness. Tympanic membrane is not injected, not scarred, not perforated, not erythematous, not retracted and not bulging. No hemotympanum.  Nose: Rhinorrhea present. No mucosal edema, sinus tenderness, nasal deformity or septal deviation. Right sinus exhibits no maxillary sinus tenderness and no frontal sinus tenderness. Left sinus exhibits no maxillary sinus tenderness and no frontal sinus tenderness.  Mouth/Throat: Uvula is midline, oropharynx is clear and moist and mucous membranes are normal. Mucous membranes are not pale, not dry and not cyanotic. No oral lesions. Normal dentition. No uvula swelling. No oropharyngeal exudate, posterior  oropharyngeal edema, posterior oropharyngeal erythema or tonsillar abscesses.  Right ear with cerumen impaction, unable to visualize TM.  Eyes: Conjunctivae are normal. Pupils are equal, round, and reactive to light. Right eye exhibits no discharge and no exudate. Left eye exhibits no discharge and no exudate. No scleral icterus. Right pupil is round and reactive. Left pupil is round and reactive. Pupils are equal.  Neck: Normal range of motion. Neck supple. No neck rigidity. No tracheal deviation, no erythema and normal range of motion present.  Cardiovascular: Normal rate, regular rhythm, normal heart sounds and intact distal pulses.  Exam reveals no gallop, no distant heart sounds and no friction rub.   No murmur heard. Pulses:      Radial pulses are 2+ on the right side, and 2+ on the left side.       Popliteal pulses are 2+ on the right side, and 2+ on the left  side.       Dorsalis pedis pulses are 2+ on the right side, and 2+ on the left side.       Posterior tibial pulses are 2+ on the right side, and 2+ on the left side.  Respiratory: Effort normal and breath sounds normal. No respiratory distress. She has no decreased breath sounds. She has no wheezes. She has no rhonchi. She has no rales. She exhibits no tenderness.  GI: Soft. Bowel sounds are normal. She exhibits no distension. There is no tenderness. There is no rebound and no guarding.  Musculoskeletal:       Right shoulder: She exhibits decreased range of motion, tenderness, bony tenderness and pain. She exhibits no swelling, no effusion, no crepitus, no deformity, no laceration, no spasm, normal pulse and normal strength.       Left shoulder: She exhibits normal range of motion, no tenderness, no bony tenderness, no swelling, no effusion, no crepitus, no deformity, no laceration, no pain and no spasm.       Right elbow: She exhibits normal range of motion, no swelling, no effusion, no deformity and no laceration. No tenderness found.        Left elbow: She exhibits normal range of motion, no swelling, no effusion, no deformity and no laceration. No tenderness found.       Right wrist: She exhibits normal range of motion, no tenderness, no bony tenderness, no swelling, no effusion and no deformity.       Left wrist: She exhibits normal range of motion, no tenderness, no bony tenderness, no swelling, no crepitus and no deformity.  Lymphadenopathy:       Head (right side): No submental, no submandibular, no tonsillar, no preauricular and no posterior auricular adenopathy present.       Head (left side): No submental, no submandibular, no tonsillar, no preauricular and no posterior auricular adenopathy present.    She has no cervical adenopathy.       Right cervical: No superficial cervical adenopathy present.      Left cervical: No superficial cervical adenopathy present.  Neurological: She is alert and oriented to person, place, and time. She displays normal reflexes. No cranial nerve deficit or sensory deficit. She exhibits normal muscle tone. GCS eye subscore is 4. GCS verbal subscore is 5. GCS motor subscore is 6.  Reflex Scores:      Brachioradialis reflexes are 2+ on the right side and 2+ on the left side.      Patellar reflexes are 2+ on the right side and 2+ on the left side.      Achilles reflexes are 2+ on the right side and 2+ on the left side. Skin: Skin is warm and dry. No rash noted. She is not diaphoretic. No erythema. No pallor.  Psychiatric: She has a normal mood and affect. Her behavior is normal.    Education/Counseling: Yes diet and exercise Yes prevention of chronic diseases Yes smoking/tobacco cessation Yes review "Covered Medicare Preventive Services"    ASSESSMENT/PLAN: 1. Medicare annual wellness visit, subsequent Age-appropriate anticipatory guidance provided. Patient UTD on most health maintenance items, provided information for breast center. Last mammogram in 2015.  2. HTN, goal below  140/80 Currently well-controlled on HCTZ and Amlodipine. Blood pressure log from home demonstrates well-controlled BP and measurement of 120/72 mmHg in office today. Continue medications as prescribed. CBC, CMP, UA pending. - CBC with Differential/Platelet - Comprehensive metabolic panel - Urine Microscopic  3. Right shoulder pain, unspecified chronicity Decreased ROM  and pain upon exam, likely osteoarthritis, advised continued use of Tylenol as needed for pain. Advised to RTC if symptoms are worsening or not improving.

## 2016-08-03 NOTE — Patient Instructions (Addendum)
We recommend that you schedule a mammogram for breast cancer screening. Typically, you do not need a referral to do this. Please contact a local imaging center to schedule your mammogram.  [facility] - [phone]  *ask for the Radiology Department [facility] ([place]) - [phone] or [phone]  [facility] - [phone] [place] [phone] [facility] - [phone]  *ask for the [facility] - [phone]  *ask for the Radiology Department [facility] - [phone]  *ask for the [facility] - [phone]     IF you received an x-ray today, you will receive an invoice from [facility]. Please contact [facility] at [phone] with questions or concerns regarding your invoice.   IF you received labwork today, you will receive an invoice from [employer]. Please contact [facility] at [phone] with questions or concerns regarding your invoice.   Our billing staff will not be able to assist you with questions regarding bills from these companies.  You will be contacted with the lab results as soon as they are available. The fastest way to get your results is to activate your My Chart account. Instructions are located on the last page of this paperwork. If you have not heard from [place] regarding the results in 2 weeks, please contact this office.    Keeping You Healthy  Get These Tests  Blood Pressure- Have your blood pressure checked by your healthcare provider at least once a year.  Normal blood pressure is 120/80.  Weight- Have your body mass index (BMI) calculated to screen for obesity.  BMI is a measure of body fat based on height and weight.  You can calculate your own BMI at [employer].it  Cholesterol- Have your cholesterol checked every year.  Diabetes- Have  your blood sugar checked every year if you have high blood pressure, high cholesterol, a family history of diabetes or if you are overweight.  Pap Test - Have a pap test every 1 to 5 years if you have been sexually active.  If you are older than 65 and recent pap tests have been normal you may not need additional pap tests.  In addition, if you have had a hysterectomy  for benign disease additional pap tests are not necessary.  Mammogram-Yearly mammograms are essential for early detection of breast cancer  Screening for Colon Cancer- Colonoscopy starting at age 55. Screening may begin sooner depending on your family history and other health conditions.  Follow up colonoscopy as directed by your Gastroenterologist.  Screening for Osteoporosis- Screening begins at age 30 with bone density scanning, sooner if you are at higher risk for developing Osteoporosis.  Get these medicines  Calcium with Vitamin D- Your body requires 1200-1500 mg of Calcium a day and [phone] IU of Vitamin D a day.  You can only absorb 500 mg of Calcium at a time therefore Calcium must be taken in 2 or 3 separate doses throughout the day.  Hormones- Hormone therapy has been associated with increased risk for certain cancers and heart disease.  Talk to your healthcare provider about if you need relief from menopausal symptoms.  Aspirin- Ask your healthcare provider about taking Aspirin to prevent Heart Disease and Stroke.  Get these Immuniztions  Flu shot- Every fall  Pneumonia shot- Once after the age of 41; if you are younger ask your healthcare provider if you need a pneumonia  shot.  Tetanus- Every ten years.  Zostavax- Once after the age of 32 to prevent shingles.  Take these steps  Don't smoke- Your healthcare provider can help you quit. For tips on how to quit, ask your healthcare provider or go to www.smokefree.gov or call 1-800 QUIT-NOW.  Be physically active- Exercise 5 days a week for a minimum of 30  minutes.  If you are not already physically active, start slow and gradually work up to 30 minutes of moderate physical activity.  Try walking, dancing, bike riding, swimming, etc.  Eat a healthy diet- Eat a variety of healthy foods such as fruits, vegetables, whole grains, low fat milk, low fat cheeses, yogurt, lean meats, chicken, fish, eggs, dried beans, tofu, etc.  For more information go to www.thenutritionsource.org  Dental visit- Brush and floss teeth twice daily; visit your dentist twice a year.  Eye exam- Visit your Optometrist or Ophthalmologist yearly.  Drink alcohol in moderation- Limit alcohol intake to one drink or less a day.  Never drink and drive.  Depression- Your emotional health is as important as your physical health.  If you're feeling down or losing interest in things you normally enjoy, please talk to your healthcare provider.  Seat Belts- can save your life; always wear one  Smoke/Carbon Monoxide detectors- These detectors need to be installed on the appropriate level of your home.  Replace batteries at least once a year.  Violence- If anyone is threatening or hurting you, please tell your healthcare provider.  Living Will/ Health care power of attorney- Discuss with your healthcare provider and family.

## 2016-08-05 ENCOUNTER — Encounter: Payer: Self-pay | Admitting: Physician Assistant

## 2016-09-03 ENCOUNTER — Other Ambulatory Visit: Payer: Self-pay

## 2016-09-03 DIAGNOSIS — E7800 Pure hypercholesterolemia, unspecified: Secondary | ICD-10-CM

## 2016-09-03 MED ORDER — SIMVASTATIN 40 MG PO TABS: 40.0000 mg | ORAL_TABLET | Freq: Every day | ORAL | 1 refills | Status: DC

## 2016-09-03 NOTE — Telephone Encounter (Signed)
Fax request for simvastatin 40mg  -req for 90 day rx. Last refill states needs fasting labs.  Pt has been in since but I don't see lipids.  Please advise

## 2016-09-03 NOTE — Telephone Encounter (Signed)
Needs OV and fasting labs in 6 months.  Meds ordered this encounter  Medications  . simvastatin (ZOCOR) 40 MG tablet    Sig: Take 1 tablet (40 mg total) by mouth at bedtime.    Dispense:  90 tablet    Refill:  1

## 2016-09-09 ENCOUNTER — Other Ambulatory Visit: Payer: Self-pay | Admitting: Family Medicine

## 2016-09-09 ENCOUNTER — Other Ambulatory Visit: Payer: Self-pay | Admitting: Physician Assistant

## 2016-09-09 DIAGNOSIS — I10 Essential (primary) hypertension: Secondary | ICD-10-CM

## 2016-09-09 DIAGNOSIS — Z8679 Personal history of other diseases of the circulatory system: Secondary | ICD-10-CM

## 2016-09-24 ENCOUNTER — Other Ambulatory Visit: Payer: Self-pay | Admitting: Physician Assistant

## 2016-09-24 DIAGNOSIS — K219 Gastro-esophageal reflux disease without esophagitis: Secondary | ICD-10-CM

## 2016-10-11 ENCOUNTER — Other Ambulatory Visit: Payer: Self-pay | Admitting: Physician Assistant

## 2016-10-11 DIAGNOSIS — K219 Gastro-esophageal reflux disease without esophagitis: Secondary | ICD-10-CM

## 2016-12-07 ENCOUNTER — Other Ambulatory Visit: Payer: Self-pay | Admitting: Physician Assistant

## 2016-12-07 DIAGNOSIS — I10 Essential (primary) hypertension: Secondary | ICD-10-CM

## 2017-02-01 ENCOUNTER — Encounter: Payer: Self-pay | Admitting: Physician Assistant

## 2017-02-01 ENCOUNTER — Ambulatory Visit (INDEPENDENT_AMBULATORY_CARE_PROVIDER_SITE_OTHER): Payer: Medicare Other | Admitting: Physician Assistant

## 2017-02-01 VITALS — BP 151/91 | HR 104 | Temp 98.8°F | Resp 17 | Ht 63.5 in | Wt 124.0 lb

## 2017-02-01 DIAGNOSIS — E860 Dehydration: Secondary | ICD-10-CM | POA: Diagnosis not present

## 2017-02-01 DIAGNOSIS — I10 Essential (primary) hypertension: Secondary | ICD-10-CM

## 2017-02-01 DIAGNOSIS — R5383 Other fatigue: Secondary | ICD-10-CM | POA: Diagnosis not present

## 2017-02-01 DIAGNOSIS — M6281 Muscle weakness (generalized): Secondary | ICD-10-CM | POA: Diagnosis not present

## 2017-02-01 LAB — POCT CBC
Granulocyte percent: 57.8 %G (ref 37–80)
HEMATOCRIT: 39.9 % (ref 37.7–47.9)
Hemoglobin: 13.5 g/dL (ref 12.2–16.2)
LYMPH, POC: 1.8 (ref 0.6–3.4)
MCH, POC: 30.6 pg (ref 27–31.2)
MCHC: 33.9 g/dL (ref 31.8–35.4)
MCV: 90.1 fL (ref 80–97)
MID (CBC): 0.3 (ref 0–0.9)
MPV: 7.5 fL (ref 0–99.8)
POC GRANULOCYTE: 2.9 (ref 2–6.9)
POC LYMPH %: 36.5 % (ref 10–50)
POC MID %: 5.7 %M (ref 0–12)
Platelet Count, POC: 237 10*3/uL (ref 142–424)
RBC: 4.43 M/uL (ref 4.04–5.48)
RDW, POC: 13.4 %
WBC: 5 10*3/uL (ref 4.6–10.2)

## 2017-02-01 LAB — POCT URINALYSIS DIP (MANUAL ENTRY)
Bilirubin, UA: NEGATIVE
GLUCOSE UA: NEGATIVE mg/dL
Nitrite, UA: NEGATIVE
PROTEIN UA: NEGATIVE mg/dL
Spec Grav, UA: 1.02 (ref 1.010–1.025)
Urobilinogen, UA: 0.2 E.U./dL
pH, UA: 5.5 (ref 5.0–8.0)

## 2017-02-01 LAB — POC MICROSCOPIC URINALYSIS (UMFC): MUCUS RE: ABSENT

## 2017-02-01 NOTE — Patient Instructions (Addendum)
Your labs today suggest you are dehydrated. I would like you to increase your water consumption to at least 64 oz of water a day. I would like you to follow up with me on Thursday in office so we can reevaluate how you are doing. If any of your symptoms worsen or you develop new onset muscle pain, please return to our clinic or seek care at the ED immediately. It was a pleasure meeting you today. We will go over your lab results in office on Thursday. Thank you for letting me participate in your health and well being.   Dehydration, Adult Dehydration is when there is not enough fluid or water in your body. This happens when you lose more fluids than you take in. Dehydration can range from mild to very bad. It should be treated right away to keep it from getting very bad. Symptoms of mild dehydration may include:   Thirst.  Dry lips.  Slightly dry mouth.  Dry, warm skin.  Dizziness. Symptoms of moderate dehydration may include:   Very dry mouth.  Muscle cramps.  Dark pee (urine). Pee may be the color of tea.  Your body making less pee.  Your eyes making fewer tears.  Heartbeat that is uneven or faster than normal (palpitations).  Headache.  Light-headedness, especially when you stand up from sitting.  Fainting (syncope). Symptoms of very bad dehydration may include:   Changes in skin, such as:  Cold and clammy skin.  Blotchy (mottled) or pale skin.  Skin that does not quickly return to normal after being lightly pinched and let go (poor skin turgor).  Changes in body fluids, such as:  Feeling very thirsty.  Your eyes making fewer tears.  Not sweating when body temperature is high, such as in hot weather.  Your body making very little pee.  Changes in vital signs, such as:  Weak pulse.  Pulse that is more than 100 beats a minute when you are sitting still.  Fast breathing.  Low blood pressure.  Other changes, such as:  Sunken eyes.  Cold hands and  feet.  Confusion.  Lack of energy (lethargy).  Trouble waking up from sleep.  Short-term weight loss.  Unconsciousness. Follow these instructions at home:  If told by your doctor, drink an ORS:  Make an ORS by using instructions on the package.  Start by drinking small amounts, about  cup (120 mL) every 5-10 minutes.  Slowly drink more until you have had the amount that your doctor said to have.  Drink enough clear fluid to keep your pee clear or pale yellow. If you were told to drink an ORS, finish the ORS first, then start slowly drinking clear fluids. Drink fluids such as:  Water. Do not drink only water by itself. Doing that can make the salt (sodium) level in your body get too low (hyponatremia).  Ice chips.  Fruit juice that you have added water to (diluted).  Low-calorie sports drinks.  Avoid:  Alcohol.  Drinks that have a lot of sugar. These include high-calorie sports drinks, fruit juice that does not have water added, and soda.  Caffeine.  Foods that are greasy or have a lot of fat or sugar.  Take over-the-counter and prescription medicines only as told by your doctor.  Do not take salt tablets. Doing that can make the salt level in your body get too high (hypernatremia).  Eat foods that have minerals (electrolytes). Examples include bananas, oranges, potatoes, tomatoes, and spinach.  Keep  all follow-up visits as told by your doctor. This is important. Contact a doctor if:  You have belly (abdominal) pain that:  Gets worse.  Stays in one area (localizes).  You have a rash.  You have a stiff neck.  You get angry or annoyed more easily than normal (irritability).  You are more sleepy than normal.  You have a harder time waking up than normal.  You feel:  Weak.  Dizzy.  Very thirsty.  You have peed (urinated) only a small amount of very dark pee during 6-8 hours. Get help right away if:  You have symptoms of very bad  dehydration.  You cannot drink fluids without throwing up (vomiting).  Your symptoms get worse with treatment.  You have a fever.  You have a very bad headache.  You are throwing up or having watery poop (diarrhea) and it:  Gets worse.  Does not go away.  You have blood or something green (bile) in your throw-up.  You have blood in your poop (stool). This may cause poop to look black and tarry.  You have not peed in 6-8 hours.  You pass out (faint).  Your heart rate when you are sitting still is more than 100 beats a minute.  You have trouble breathing. This information is not intended to replace advice given to you by your health care provider. Make sure you discuss any questions you have with your health care provider. Document Released: 07/03/2009 Document Revised: 03/26/2016 Document Reviewed: 10/31/2015 Elsevier Interactive Patient Education  2017 [employer].   IF you received an x-ray today, you will receive an invoice from [facility]. Please contact [facility] at [phone] with questions or concerns regarding your invoice.   IF you received labwork today, you will receive an invoice from [facility]. Please contact [facility] at [phone] with questions or concerns regarding your invoice.   Our billing staff will not be able to assist you with questions regarding bills from these companies.  You will be contacted with the lab results as soon as they are available. The fastest way to get your results is to activate your My Chart account. Instructions are located on the last page of this paperwork. If you have not heard from [place] regarding the results in 2 weeks, please contact this office.

## 2017-02-01 NOTE — Progress Notes (Signed)
MRN: [id number] DOB: Feb 15, 1946  Subjective:   Kristy [name] is a 71 y.o. female presenting for bp recheck. Pt has hx of HTN. Currently managed with amlodipine 63m and hctz 12.5 . Patient is checking blood pressure at home, range is 1726-203systolic. Notes she always has high bp readings when she comes to the doctor. Reports over the weekend she felt slightly nauseous and woozy (lightheadedness) after being in the heat pulling up bushes. Just "did not feel like myself."  She notes she did not drink a lot of water that day. Took famotidine and it improved. Felt great yesterday.  Denies chronic headache, double vision, chest pain, shortness of breath, heart racing, palpitations, vomiting, abdominal pain, hematuria, lower leg swelling. Denies smoking or alcohol use. Today, she states she is not 100%, feels slightly weak.  Of note, pt only consumes about 24 oz of water in a typical day.   Review of Systems  Constitutional: Negative for chills, diaphoresis and fever.  HENT: Negative for congestion.   Respiratory: Negative for cough.   Gastrointestinal: Negative for constipation and diarrhea.  Genitourinary: Positive for frequency (baseline for pt). Negative for dysuria, hematuria and urgency.  Musculoskeletal: Negative for back pain, joint pain and myalgias.  Neurological: Negative for speech change and headaches.  Endo/Heme/Allergies: Positive for environmental allergies.     EJylhas a current medication list which includes the following prescription(s): amlodipine, cetirizine, famotidine, hydrochlorothiazide, meclizine, multivitamin with minerals, and simvastatin. Also has No Known Allergies.  EBriza has a past medical history of Allergy; Anemia; Hyperlipidemia; and Hypertension. Also  has a past surgical history that includes Partial hysterectomy; Tubal ligation; and Abdominal hysterectomy.   Objective:   Vitals: BP (!) 151/91   Pulse (!) 104   Temp 98.8 F (37.1 C) (Oral)   Resp  17   Ht 5' 3.5" (1.613 m)   Wt 124 lb (56.2 kg)   SpO2 97%   BMI 21.62 kg/m   Physical Exam  Constitutional: She is oriented to person, place, and time. She appears well-developed and well-nourished. No distress.  HENT:  Head: Normocephalic and atraumatic.  Right Ear: External ear and ear canal normal. There is drainage (cerumen impaction blocking view of TM).  Left Ear: Tympanic membrane, external ear and ear canal normal.  Nose: Mucosal edema (moderate bilaterally) present.  Mouth/Throat: Uvula is midline. Mucous membranes are dry. Posterior oropharyngeal erythema present.  Eyes: Conjunctivae are normal.  Neck: Normal range of motion.  Cardiovascular: Regular rhythm and normal heart sounds.  Tachycardia present.   Pulmonary/Chest: Effort normal and breath sounds normal. She has no wheezes. She has no rhonchi. She has no rales.  Abdominal: Soft. Normal appearance and bowel sounds are normal. There is no tenderness.  Lymphadenopathy:       Head (right side): No submental, no submandibular, no tonsillar, no preauricular, no posterior auricular and no occipital adenopathy present.       Head (left side): No submental, no submandibular, no tonsillar, no preauricular, no posterior auricular and no occipital adenopathy present.    She has cervical adenopathy.       Right cervical: Superficial cervical adenopathy present. No deep cervical and no posterior cervical adenopathy present.      Left cervical: No superficial cervical, no deep cervical and no posterior cervical adenopathy present.       Right: No supraclavicular adenopathy present.       Left: No supraclavicular adenopathy present.  Neurological: She is alert and oriented to  person, place, and time.  Skin: Skin is warm and dry.  Psychiatric: She has a normal mood and affect.  Vitals reviewed.    BP Readings from Last 3 Encounters:  02/01/17 (!) 151/91  08/03/16 120/72  04/05/16 (!) 172/94    Results for orders placed or  performed in visit on 02/01/17 (from the past 24 hour(s))  POCT CBC     Status: None   Collection Time: 02/01/17  9:02 AM  Result Value Ref Range   WBC 5.0 4.6 - 10.2 K/uL   Lymph, poc 1.8 0.6 - 3.4   POC LYMPH PERCENT 36.5 10 - 50 %L   MID (cbc) 0.3 0 - 0.9   POC MID % 5.7 0 - 12 %M   POC Granulocyte 2.9 2 - 6.9   Granulocyte percent 57.8 37 - 80 %G   RBC 4.43 4.04 - 5.48 M/uL   Hemoglobin 13.5 12.2 - 16.2 g/dL   HCT, POC 39.9 37.7 - 47.9 %   MCV 90.1 80 - 97 fL   MCH, POC 30.6 27 - 31.2 pg   MCHC 33.9 31.8 - 35.4 g/dL   RDW, POC 13.4 %   Platelet Count, POC 237 142 - 424 K/uL   MPV 7.5 0 - 99.8 fL  POCT urinalysis dipstick     Status: Abnormal   Collection Time: 02/01/17  9:40 AM  Result Value Ref Range   Color, UA yellow yellow   Clarity, UA clear clear   Glucose, UA negative negative mg/dL   Bilirubin, UA negative negative   Ketones, POC UA moderate (40) (A) negative mg/dL   Spec Grav, UA 1.020 1.010 - 1.025   Blood, UA trace-intact (A) negative   pH, UA 5.5 5.0 - 8.0   Protein Ur, POC negative negative mg/dL   Urobilinogen, UA 0.2 0.2 or 1.0 E.U./dL   Nitrite, UA Negative Negative   Leukocytes, UA Trace (A) Negative  POCT Microscopic Urinalysis ([facility])     Status: Abnormal   Collection Time: 02/01/17  9:50 AM  Result Value Ref Range   WBC,UR,HPF,POC Few (A) None WBC/hpf   RBC,UR,HPF,POC Few (A) None RBC/hpf   Bacteria Few (A) None, Too numerous to count   Mucus Absent Absent   Epithelial Cells, UR Per Microscopy Moderate (A) None, Too numerous to count cells/hpf   Orthostatic VS for the past 24 hrs:  BP- Lying Pulse- Lying BP- Sitting Pulse- Sitting BP- Standing at 0 minutes Pulse- Standing at 0 minutes  02/01/17 0911 150/90 87 155/85 78 146/90 92   EKG shows sinus rhythm with rate of 87bpm. PR and QRS intervals within normal limits. No acute changes noted from prior EKG of 04/05/16. Findings presented and discussed with Dr. Carlota Raspberry.   Tachycardia resolved with  oral hydration of 8 oz of water in office.   Assessment and Plan :  1. Other fatigue POCT labs suggest pt is dehydrated but are otherwise reassuring. Questionable UTI, will send urine culture. Other labs pending as well. Encouraged to increase water consumption to at least 64 oz a day. Plan for follow up in 2 days for reevaluation. Given strict ED/return precuations.  - CMP14+EGFR - POCT CBC - EKG 12-Lead - POCT urinalysis dipstick - POCT Microscopic Urinalysis ([facility]) - TSH - Orthostatic vital signs - CK - Urine culture  2. HTN, goal below 140/80 Mildly uncontrolled in office today. However this is patient's normal response to being in a doctor's office. Her outside readings are within normal range. Will  recheck in 2 days.   3. Dehydration Given educational material on dehydration. Increase water consumption to at least 64 oz of water a day.   Tenna Delaine, PA-C  [facility] 02/01/2017 12:32 PM

## 2017-02-02 LAB — CMP14+EGFR
ALBUMIN: 4.9 g/dL — AB (ref 3.6–4.8)
ALK PHOS: 75 IU/L (ref 39–117)
ALT: 17 IU/L (ref 0–32)
AST: 23 IU/L (ref 0–40)
Albumin/Globulin Ratio: 1.5 (ref 1.2–2.2)
BILIRUBIN TOTAL: 0.9 mg/dL (ref 0.0–1.2)
BUN / CREAT RATIO: 11 — AB (ref 12–28)
BUN: 9 mg/dL (ref 8–27)
CHLORIDE: 100 mmol/L (ref 96–106)
CO2: 22 mmol/L (ref 18–29)
Calcium: 10.3 mg/dL (ref 8.7–10.3)
Creatinine, Ser: 0.82 mg/dL (ref 0.57–1.00)
GFR calc non Af Amer: 73 mL/min/{1.73_m2} (ref >59)
GFR, EST AFRICAN AMERICAN: 84 mL/min/{1.73_m2} (ref >59)
GLUCOSE: 116 mg/dL — AB (ref 65–99)
Globulin, Total: 3.3 g/dL (ref 1.5–4.5)
POTASSIUM: 3.3 mmol/L — AB (ref 3.5–5.2)
Sodium: 143 mmol/L (ref 134–144)
Total Protein: 8.2 g/dL (ref 6.0–8.5)

## 2017-02-02 LAB — URINE CULTURE

## 2017-02-02 LAB — CK: Total CK: 92 U/L (ref 24–173)

## 2017-02-02 LAB — TSH: TSH: 2.25 u[IU]/mL (ref 0.450–4.500)

## 2017-02-03 ENCOUNTER — Encounter: Payer: Self-pay | Admitting: Physician Assistant

## 2017-02-03 ENCOUNTER — Ambulatory Visit (INDEPENDENT_AMBULATORY_CARE_PROVIDER_SITE_OTHER): Payer: Medicare Other | Admitting: Physician Assistant

## 2017-02-03 ENCOUNTER — Other Ambulatory Visit: Payer: Self-pay | Admitting: Physician Assistant

## 2017-02-03 VITALS — BP 140/82 | HR 87 | Temp 98.2°F | Resp 18 | Ht 63.5 in | Wt 124.0 lb

## 2017-02-03 DIAGNOSIS — E876 Hypokalemia: Secondary | ICD-10-CM | POA: Diagnosis not present

## 2017-02-03 DIAGNOSIS — R319 Hematuria, unspecified: Secondary | ICD-10-CM

## 2017-02-03 DIAGNOSIS — I10 Essential (primary) hypertension: Secondary | ICD-10-CM

## 2017-02-03 DIAGNOSIS — R824 Acetonuria: Secondary | ICD-10-CM

## 2017-02-03 DIAGNOSIS — E860 Dehydration: Secondary | ICD-10-CM | POA: Diagnosis not present

## 2017-02-03 DIAGNOSIS — R5383 Other fatigue: Secondary | ICD-10-CM

## 2017-02-03 LAB — POCT URINALYSIS DIP (MANUAL ENTRY)
Bilirubin, UA: NEGATIVE
GLUCOSE UA: NEGATIVE mg/dL
Ketones, POC UA: NEGATIVE mg/dL
Leukocytes, UA: NEGATIVE
NITRITE UA: NEGATIVE
PH UA: 6 (ref 5.0–8.0)
Protein Ur, POC: NEGATIVE mg/dL
Spec Grav, UA: <=1.005 — AB (ref 1.010–1.025)
UROBILINOGEN UA: 0.2 U/dL

## 2017-02-03 LAB — POC MICROSCOPIC URINALYSIS (UMFC): MUCUS RE: ABSENT

## 2017-02-03 NOTE — Patient Instructions (Addendum)
Your urine is improving. There is still trace blood but not enough that was seen on microscope. Your ketones are completely gone, which is great and does indicate that you have resolved that with proper hydration. The only other abnormal finding was you were a little deficient on potassium the other day. Below are a list of high potassium foods, please make sure you are eating some of these daily. If you ever start to have muscle cramps, heart palpitations, or paraesthesias, please seek care immediately. Otherwise, you can follow up with PA Chelle for your next appointment, I do recommend you bring your bp cuff into your next visit so we can calibrate it in office. Please let me know if you have any other questions. Thank you for letting me participate in your health and well being.    Potassium Content of Foods Potassium is a mineral found in many foods and drinks. It helps keep fluids and minerals balanced in your body and affects how steadily your heart beats. Potassium also helps control your blood pressure and keep your muscles and nervous system healthy. Certain health conditions and medicines may change the balance of potassium in your body. When this happens, you can help balance your level of potassium through the foods that you do or do not eat. Your health care provider or dietitian may recommend an amount of potassium that you should have each day. The following lists of foods provide the amount of potassium (in parentheses) per serving in each item. High in potassium The following foods and beverages have 200 mg or more of potassium per serving:  Apricots, 2 raw or 5 dry (200 mg).  Artichoke, 1 medium (345 mg).  Avocado, raw,  each (245 mg).  Banana, 1 medium (425 mg).  Beans, lima, or baked beans, canned,  cup (280 mg).  Beans, white, canned,  cup (595 mg).  Beef roast, 3 oz (320 mg).  Beef, ground, 3 oz (270 mg).  Beets, raw or cooked,  cup (260 mg).  Bran muffin, 2 oz  (300 mg).  Broccoli,  cup (230 mg).  Brussels sprouts,  cup (250 mg).  Cantaloupe,  cup (215 mg).  Cereal, 100% bran,  cup (200-400 mg).  Cheeseburger, single, fast food, 1 each (225-400 mg).  Chicken, 3 oz (220 mg).  Clams, canned, 3 oz (535 mg).  Crab, 3 oz (225 mg).  Dates, 5 each (270 mg).  Dried beans and peas,  cup (300-475 mg).  Figs, dried, 2 each (260 mg).  Fish: halibut, tuna, cod, snapper, 3 oz (480 mg).  Fish: salmon, haddock, swordfish, perch, 3 oz (300 mg).  Fish, tuna, canned 3 oz (200 mg).  [place] fries, fast food, 3 oz (470 mg).  Granola with fruit and nuts,  cup (200 mg).  Grapefruit juice,  cup (200 mg).  Greens, beet,  cup (655 mg).  Honeydew melon,  cup (200 mg).  Kale, raw, 1 cup (300 mg).  Kiwi, 1 medium (240 mg).  Kohlrabi, rutabaga, parsnips,  cup (280 mg).  Lentils,  cup (365 mg).  Mango, 1 each (325 mg).  Milk, chocolate, 1 cup (420 mg).  Milk: nonfat, low-fat, whole, buttermilk, 1 cup (350-380 mg).  Molasses, 1 Tbsp (295 mg).  Mushrooms,  cup (280) mg.  Nectarine, 1 each (275 mg).  Nuts: almonds, peanuts, hazelnuts, [place], cashew, mixed, 1 oz (200 mg).  Nuts, pistachios, 1 oz (295 mg).  Orange, 1 each (240 mg).  Orange juice,  cup (235 mg).  Papaya,  medium,  fruit (390 mg).  Peanut butter, chunky, 2 Tbsp (240 mg).  Peanut butter, smooth, 2 Tbsp (210 mg).  Pear, 1 medium (200 mg).  Pomegranate, 1 whole (400 mg).  Pomegranate juice,  cup (215 mg).  Pork, 3 oz (350 mg).  Potato chips, salted, 1 oz (465 mg).  Potato, baked with skin, 1 medium (925 mg).  Potatoes, boiled,  cup (255 mg).  Potatoes, mashed,  cup (330 mg).  Prune juice,  cup (370 mg).  Prunes, 5 each (305 mg).  Pudding, chocolate,  cup (230 mg).  Pumpkin, canned,  cup (250 mg).  Raisins, seedless,  cup (270 mg).  Seeds, sunflower or pumpkin, 1 oz (240 mg).  Soy milk, 1 cup (300 mg).  Spinach,  cup (420  mg).  Spinach, canned,  cup (370 mg).  Sweet potato, baked with skin, 1 medium (450 mg).  Swiss chard,  cup (480 mg).  Tomato or vegetable juice,  cup (275 mg).  Tomato sauce or puree,  cup (400-550 mg).  Tomato, raw, 1 medium (290 mg).  Tomatoes, canned,  cup (200-300 mg).  [place], 3 oz (250 mg).  Wheat germ, 1 oz (250 mg).  Winter squash,  cup (250 mg).  Yogurt, plain or fruited, 6 oz (260-435 mg).  Zucchini,  cup (220 mg). Moderate in potassium The following foods and beverages have 50-200 mg of potassium per serving:  Apple, 1 each (150 mg).  Apple juice,  cup (150 mg).  Applesauce,  cup (90 mg).  Apricot nectar,  cup (140 mg).  Asparagus, small spears,  cup or 6 spears (155 mg).  Bagel, cinnamon raisin, 1 each (130 mg).  Bagel, egg or plain, 4 in., 1 each (70 mg).  Beans, green,  cup (90 mg).  Beans, yellow,  cup (190 mg).  Beer, regular, 12 oz (100 mg).  Beets, canned,  cup (125 mg).  Blackberries,  cup (115 mg).  Blueberries,  cup (60 mg).  Bread, whole wheat, 1 slice (70 mg).  Broccoli, raw,  cup (145 mg).  Cabbage,  cup (150 mg).  Carrots, cooked or raw,  cup (180 mg).  Cauliflower, raw,  cup (150 mg).  Celery, raw,  cup (155 mg).  Cereal, bran flakes, cup (120-150 mg).  Cheese, cottage,  cup (110 mg).  Cherries, 10 each (150 mg).  Chocolate, 1 oz bar (165 mg).  Coffee, brewed 6 oz (90 mg).  Corn,  cup or 1 ear (195 mg).  Cucumbers,  cup (80 mg).  Egg, large, 1 each (60 mg).  Eggplant,  cup (60 mg).  Endive, raw, cup (80 mg).  English muffin, 1 each (65 mg).  Fish, orange roughy, 3 oz (150 mg).  Frankfurter, beef or pork, 1 each (75 mg).  Fruit cocktail,  cup (115 mg).  Grape juice,  cup (170 mg).  Grapefruit,  fruit (175 mg).  Grapes,  cup (155 mg).  Greens: kale, turnip, collard,  cup (110-150 mg).  Ice cream or frozen yogurt, chocolate,  cup (175 mg).  Ice cream or  frozen yogurt, vanilla,  cup (120-150 mg).  Lemons, limes, 1 each (80 mg).  Lettuce, all types, 1 cup (100 mg).  Mixed vegetables,  cup (150 mg).  Mushrooms, raw,  cup (110 mg).  Nuts: walnuts, pecans, or macadamia, 1 oz (125 mg).  Oatmeal,  cup (80 mg).  Okra,  cup (110 mg).  Onions, raw,  cup (120 mg).  Peach, 1 each (185 mg).  Peaches, canned,  cup (120  mg).  Pears, canned,  cup (120 mg).  Peas, green, frozen,  cup (90 mg).  Peppers, green,  cup (130 mg).  Peppers, red,  cup (160 mg).  Pineapple juice,  cup (165 mg).  Pineapple, fresh or canned,  cup (100 mg).  Plums, 1 each (105 mg).  Pudding, vanilla,  cup (150 mg).  Raspberries,  cup (90 mg).  Rhubarb,  cup (115 mg).  Rice, wild,  cup (80 mg).  Shrimp, 3 oz (155 mg).  Spinach, raw, 1 cup (170 mg).  Strawberries,  cup (125 mg).  Summer squash  cup (175-200 mg).  Swiss chard, raw, 1 cup (135 mg).  Tangerines, 1 each (140 mg).  Tea, brewed, 6 oz (65 mg).  Turnips,  cup (140 mg).  Watermelon,  cup (85 mg).  Wine, red, table, 5 oz (180 mg).  Wine, white, table, 5 oz (100 mg). Low in potassium The following foods and beverages have less than 50 mg of potassium per serving.  Bread, white, 1 slice (30 mg).  Carbonated beverages, 12 oz (less than 5 mg).  Cheese, 1 oz (20-30 mg).  Cranberries,  cup (45 mg).  Cranberry juice cocktail,  cup (20 mg).  Fats and oils, 1 Tbsp (less than 5 mg).  Hummus, 1 Tbsp (32 mg).  Nectar: papaya, mango, or pear,  cup (35 mg).  Rice, white or brown,  cup (50 mg).  Spaghetti or macaroni,  cup cooked (30 mg).  Tortilla, flour or corn, 1 each (50 mg).  Waffle, 4 in., 1 each (50 mg).  Water chestnuts,  cup (40 mg). This information is not intended to replace advice given to you by your health care provider. Make sure you discuss any questions you have with your health care provider. Document Released: 04/20/2005 Document  Revised: 02/12/2016 Document Reviewed: 08/03/2013 Elsevier Interactive Patient Education  2017 [employer].  IF you received an x-ray today, you will receive an invoice from [facility]. Please contact [facility] at [phone] with questions or concerns regarding your invoice.   IF you received labwork today, you will receive an invoice from [facility]. Please contact [facility] at [phone] with questions or concerns regarding your invoice.   Our billing staff will not be able to assist you with questions regarding bills from these companies.  You will be contacted with the lab results as soon as they are available. The fastest way to get your results is to activate your My Chart account. Instructions are located on the last page of this paperwork. If you have not heard from [place] regarding the results in 2 weeks, please contact this office.

## 2017-02-03 NOTE — Progress Notes (Signed)
shay 

## 2017-02-03 NOTE — Progress Notes (Signed)
MRN: [id number] DOB: Apr 03, 1946  Subjective:   Kristy [name] is a 71 y.o. female presenting for follow up.Pt initially seen on 02/01/17 for "not feeling well". She had been out in the heat a few days prior to her initial visit and after that noticed she just did not feel herself and felt a little nauseous. She has hx of HTN. Notes she has white coat syndrome so it is usually elevated in office but she checks it at home and it is typically in 120-140 range. During her last visit, her EKG, CBC, TSH, CK, and orthostatic vital signs were all normal. Her CMP showed slightly low potassium at 3.3. Her urine indicated she was slightly dehydrated, was having hematuria and ketonuria. Pt admits to only drinking about 24 oz of water on a typical day. She also had  trace leukocytes. Urine culture showed <10,000 colonies of growth. She was instructed to go home and increase water consumption to 64 oz daily and return in two days for follow up. Today, she states she is 100% back to normal. She has been drinking 64 oz of water daily and notes she feels great. Her fatigue and nausea have completely resolved. Denies headache, double vision, chest pain, shortness of breath, heart racing, palpitations, vomiting, abdominal pain, hematuria, and lower leg swelling. Pt denies smoking.   Kristy [name] has a current medication list which includes the following prescription(s): amlodipine, cetirizine, famotidine, hydrochlorothiazide, meclizine, multivitamin with minerals, and simvastatin. Also has No Known Allergies.  Kristy [name]  has a past medical history of Allergy; Anemia; Hyperlipidemia; and Hypertension. Also  has a past surgical history that includes Partial hysterectomy; Tubal ligation; and Abdominal hysterectomy.   Objective:   Vitals: BP 140/82 (BP Location: Left Arm, Patient Position: Sitting, Cuff Size: Normal)   Pulse 87   Temp 98.2 F (36.8 C) (Oral)   Resp 18   Ht 5' 3.5" (1.613 m)   Wt 124 lb (56.2 kg)   SpO2 100%    BMI 21.62 kg/m   Physical Exam  Constitutional: She is oriented to person, place, and time. She appears well-developed and well-nourished. No distress.  HENT:  Head: Normocephalic and atraumatic.  Mouth/Throat: Uvula is midline, oropharynx is clear and moist and mucous membranes are normal.  Eyes: Conjunctivae are normal.  Neck: Normal range of motion.  Cardiovascular: Normal rate, regular rhythm and normal heart sounds.   Pulmonary/Chest: Effort normal and breath sounds normal.  Neurological: She is alert and oriented to person, place, and time.  Skin: Skin is warm and dry.  Psychiatric: She has a normal mood and affect.  Vitals reviewed.   Results for orders placed or performed in visit on 02/03/17 (from the past 24 hour(s))  POCT urinalysis dipstick     Status: Abnormal   Collection Time: 02/03/17  8:58 AM  Result Value Ref Range   Color, UA yellow yellow   Clarity, UA clear clear   Glucose, UA negative negative mg/dL   Bilirubin, UA negative negative   Ketones, POC UA negative negative mg/dL   Spec Grav, UA <=1.005 (A) 1.010 - 1.025   Blood, UA trace-lysed (A) negative   pH, UA 6.0 5.0 - 8.0   Protein Ur, POC negative negative mg/dL   Urobilinogen, UA 0.2 0.2 or 1.0 E.U./dL   Nitrite, UA Negative Negative   Leukocytes, UA Negative Negative  POCT Microscopic Urinalysis ([facility])     Status: Abnormal   Collection Time: 02/03/17  9:08 AM  Result Value Ref Range  WBC,UR,HPF,POC None None WBC/hpf   RBC,UR,HPF,POC None None RBC/hpf   Bacteria None None, Too numerous to count   Mucus Absent Absent   Epithelial Cells, UR Per Microscopy Few (A) None, Too numerous to count cells/hpf    Assessment and Plan :  1. Hematuria, unspecified type Improved, trace lysed noted on dipstick today, none noted on microscope. - POCT Microscopic Urinalysis ([facility]) - POCT urinalysis dipstick 2. Ketonuria Resolved.  - POCT Microscopic Urinalysis ([facility]) - POCT urinalysis dipstick  3.  Dehydration Improved, pt's urine has improved. She states she is completely back to normal. Her mucous membranes are moist today. Encouraged to continue consuming 40-64 oz of water a day, strive for the upper limit if going to be out in the heat. Pt understands. Return to clinic if symptoms worsen, do not improve, or as needed - POCT Microscopic Urinalysis ([facility]) - POCT urinalysis dipstick  4. Low serum potassium Potassium of 3.3 two days ago. Given pt a list of high potassium foods to focus on making a part of her daily diet. Will recheck BMP today and contact pt with results and any further tx plan. Given strict ED/return precautions both verbally and in AVS.  - Basic metabolic panel  5. Other fatigue Resolved.   6. HTN, goal below 140/80 Initial BP elevated. Recheck was 140/82. Pt has hx of white coat syndrome. Encouraged her to bring her home bp cuff to next OV with PA Chelle so she can have it calibrated at the office to ensure accurate readings at home. Pt agrees to do so.   Tenna Delaine, PA-C  [facility] 02/03/2017 9:09 AM

## 2017-02-03 NOTE — Assessment & Plan Note (Signed)
Pt has hx of white coat syndrome. Encouraged her to bring her home bp cuff to next OV with PA Chelle so she can have it calibrated at the office to ensure accurate readings at home. Pt agrees to do so.

## 2017-02-04 LAB — BASIC METABOLIC PANEL
BUN/Creatinine Ratio: 9 — ABNORMAL LOW (ref 12–28)
BUN: 7 mg/dL — ABNORMAL LOW (ref 8–27)
CO2: 23 mmol/L (ref 18–29)
CREATININE: 0.76 mg/dL (ref 0.57–1.00)
Calcium: 9.9 mg/dL (ref 8.7–10.3)
Chloride: 99 mmol/L (ref 96–106)
GFR calc Af Amer: 93 mL/min/{1.73_m2} (ref >59)
GFR calc non Af Amer: 80 mL/min/{1.73_m2} (ref >59)
GLUCOSE: 92 mg/dL (ref 65–99)
POTASSIUM: 3.8 mmol/L (ref 3.5–5.2)
SODIUM: 141 mmol/L (ref 134–144)

## 2017-03-05 ENCOUNTER — Other Ambulatory Visit: Payer: Self-pay | Admitting: Physician Assistant

## 2017-03-05 DIAGNOSIS — Z8679 Personal history of other diseases of the circulatory system: Secondary | ICD-10-CM

## 2017-03-05 DIAGNOSIS — I10 Essential (primary) hypertension: Secondary | ICD-10-CM

## 2017-03-13 ENCOUNTER — Other Ambulatory Visit: Payer: Self-pay | Admitting: Physician Assistant

## 2017-03-13 DIAGNOSIS — E7800 Pure hypercholesterolemia, unspecified: Secondary | ICD-10-CM

## 2017-04-07 ENCOUNTER — Other Ambulatory Visit: Payer: Self-pay | Admitting: Physician Assistant

## 2017-04-07 DIAGNOSIS — K219 Gastro-esophageal reflux disease without esophagitis: Secondary | ICD-10-CM

## 2017-05-24 ENCOUNTER — Other Ambulatory Visit: Payer: Self-pay | Admitting: Physician Assistant

## 2017-05-24 DIAGNOSIS — K219 Gastro-esophageal reflux disease without esophagitis: Secondary | ICD-10-CM

## 2017-05-31 NOTE — Telephone Encounter (Signed)
Chelle the last time this was filled was in 2016 patient called wanting it refilled?  Please Advise

## 2017-05-31 NOTE — Telephone Encounter (Signed)
PT CALLING FOR HER REFILL ON PEPCID SHE STATES THAT PHARMACY SENT IT OVER LAST WEEK

## 2017-07-06 DIAGNOSIS — Z23 Encounter for immunization: Secondary | ICD-10-CM | POA: Diagnosis not present

## 2017-07-29 ENCOUNTER — Telehealth: Payer: Self-pay | Admitting: Physician Assistant

## 2017-07-29 ENCOUNTER — Encounter: Payer: Medicare Other | Admitting: Physician Assistant

## 2017-07-29 NOTE — Telephone Encounter (Signed)
Just so your aware.

## 2017-07-29 NOTE — Telephone Encounter (Signed)
Pt came in on 07/29/17 to have CPE done but her last physical was on 08/03/16 so we rescheduled so her physical would be a year past her last. We had scheduled for 08/03/17 but the pt also needed an AWV and these have to be scheduled at least a year and a day after last physical. I tried calling pt to let her know this. It looks like she has been rescheduled for 08/05/17 for both of these. I spoke with Dewitt Hoes and she said if the pt has been scheduled, that means the scheduler did speak with her. I asked the pt to call me back just to verify she is aware of the change. Thanks!

## 2017-08-01 ENCOUNTER — Other Ambulatory Visit: Payer: Self-pay | Admitting: Physician Assistant

## 2017-08-01 DIAGNOSIS — I10 Essential (primary) hypertension: Secondary | ICD-10-CM

## 2017-08-01 NOTE — Telephone Encounter (Signed)
Getting an interaction warning between Amlodipine and Simvastatin

## 2017-08-03 ENCOUNTER — Encounter: Payer: Medicare Other | Admitting: Physician Assistant

## 2017-08-05 ENCOUNTER — Ambulatory Visit: Payer: Medicare Other | Admitting: Physician Assistant

## 2017-08-05 ENCOUNTER — Other Ambulatory Visit: Payer: Self-pay

## 2017-08-05 ENCOUNTER — Encounter: Payer: Self-pay | Admitting: Physician Assistant

## 2017-08-05 ENCOUNTER — Ambulatory Visit (INDEPENDENT_AMBULATORY_CARE_PROVIDER_SITE_OTHER): Payer: Medicare Other

## 2017-08-05 VITALS — BP 136/88 | HR 77 | Temp 98.2°F | Resp 18 | Ht 64.0 in | Wt 125.2 lb

## 2017-08-05 VITALS — BP 136/88 | HR 77 | Ht 64.0 in | Wt 125.1 lb

## 2017-08-05 DIAGNOSIS — H9313 Tinnitus, bilateral: Secondary | ICD-10-CM

## 2017-08-05 DIAGNOSIS — Z8679 Personal history of other diseases of the circulatory system: Secondary | ICD-10-CM | POA: Diagnosis not present

## 2017-08-05 DIAGNOSIS — E7800 Pure hypercholesterolemia, unspecified: Secondary | ICD-10-CM

## 2017-08-05 DIAGNOSIS — R202 Paresthesia of skin: Secondary | ICD-10-CM | POA: Insufficient documentation

## 2017-08-05 DIAGNOSIS — Z1231 Encounter for screening mammogram for malignant neoplasm of breast: Secondary | ICD-10-CM

## 2017-08-05 DIAGNOSIS — I10 Essential (primary) hypertension: Secondary | ICD-10-CM | POA: Diagnosis not present

## 2017-08-05 DIAGNOSIS — Z0000 Encounter for general adult medical examination without abnormal findings: Secondary | ICD-10-CM | POA: Diagnosis not present

## 2017-08-05 DIAGNOSIS — R42 Dizziness and giddiness: Secondary | ICD-10-CM

## 2017-08-05 DIAGNOSIS — Z1211 Encounter for screening for malignant neoplasm of colon: Secondary | ICD-10-CM

## 2017-08-05 DIAGNOSIS — R1013 Epigastric pain: Secondary | ICD-10-CM

## 2017-08-05 DIAGNOSIS — H9319 Tinnitus, unspecified ear: Secondary | ICD-10-CM | POA: Insufficient documentation

## 2017-08-05 MED ORDER — MECLIZINE HCL 12.5 MG PO TABS: 12.5000 mg | ORAL_TABLET | Freq: Three times a day (TID) | ORAL | 0 refills | Status: DC | PRN

## 2017-08-05 MED ORDER — PANTOPRAZOLE SODIUM 40 MG PO TBEC: 40.0000 mg | DELAYED_RELEASE_TABLET | Freq: Every day | ORAL | 3 refills | Status: DC

## 2017-08-05 MED ORDER — AMLODIPINE BESYLATE 5 MG PO TABS: 5.0000 mg | ORAL_TABLET | Freq: Every day | ORAL | 3 refills | Status: DC

## 2017-08-05 NOTE — Assessment & Plan Note (Signed)
Controlled. Continue current treatment. 

## 2017-08-05 NOTE — Patient Instructions (Addendum)
Call to schedule the mammogram: [place] [phone] Call [facility] to schedule the colonoscopy.  STOP the famotidine. Try pantoprazole in it's place. Let me know if the heartburn, sour belches, fast heartbeat or ringing in your ears continues.    IF you received an x-ray today, you will receive an invoice from [facility]. Please contact [facility] at [phone] with questions or concerns regarding your invoice.   IF you received labwork today, you will receive an invoice from [place]. Please contact [facility] at [phone] with questions or concerns regarding your invoice.   Our billing staff will not be able to assist you with questions regarding bills from these companies.  You will be contacted with the lab results as soon as they are available. The fastest way to get your results is to activate your My Chart account. Instructions are located on the last page of this paperwork. If you have not heard from [place] regarding the results in 2 weeks, please contact this office.

## 2017-08-05 NOTE — Progress Notes (Deleted)
Presents today for [employer] Visit-Subsequent.   Date of last exam: ***  Interpreter used for this visit? {Yes/No-Ex:120004}  Patient Care Team: Harrison Mons, PA-C as PCP - General (Family Medicine)   Other items to address today: {NONE DEFAULTED:18576::"none"} ***  Cancer Screening: Cervical: {Yes/No-Ex:120004} Breast: {Yes/No-Ex:120004} Colon: {Yes/No-Ex:120004}  Prostate: {Yes/No-Ex:120004}   Other Screening: Last screening for diabetes: *** Last lipid screening: ***  ADVANCE DIRECTIVES: Discussed: {Yes/No-Ex:120004} Patient desires CPR ({YES/NO:21197}), mechanical ventilation ({YES/NO:21197}), prolonged artificial support (may include mechanical ventilation, tube/PEG feeding, etc) ({YES/NO:21197}). On File: {Yes/No-Ex:120004} Materials Provided: {Yes/No-Ex:120004}  Immunization status:  Immunization History  Administered Date(s) Administered  . Influenza Split 07/12/2012, 06/25/2014, 07/19/2016  . Influenza,inj,Quad PF,6+ Mos 07/03/2015  . Pneumococcal Conjugate-13 07/22/2015  . Pneumococcal Polysaccharide-23 05/03/2013  . Tdap 03/31/2012  . Zoster 09/20/2009     There are no preventive care reminders to display for this patient.   Functional Status Survey: Is the patient deaf or have difficulty hearing?: No Does the patient have difficulty seeing, even when wearing glasses/contacts?: No Does the patient have difficulty concentrating, remembering, or making decisions?: No Does the patient have difficulty walking or climbing stairs?: No Does the patient have difficulty dressing or bathing?: No Does the patient have difficulty doing errands alone such as visiting a doctor's office or shopping?: No Clinical Intake - 08/05/17 1416      Pre-visit preparation   Pre-visit preparation completed  Yes      Pain   Pain   No/denies pain      Nutrition Screen   Nutritional Status  BMI of 19-24  Normal    Nutritional Risks  None    Diabetes  No      Functional Status   Activities of Daily Living  Independent    Ambulation  Independent with device- listed below    [place] Management  Independent      Risk/Barriers   Barriers to Care Management & Learning  None      Abuse/Neglect   Do you feel unsafe in your current relationship?  No    Do you feel physically threatened by others?  No    Anyone hurting you at home, work, or school?  No    Unable to ask?  No      Patient Literacy   How often do you need to have someone help you when you read instructions, pamphlets, or other written materials from your doctor or pharmacy?  1 - Never    What is the last grade level you completed in school?  [employer] Degree      Investment banker, operational Needed?  No      Comments   Information entered by :  Andrez Grime, LPN       Home Environment: ***  Urinary Incontinence Screening: ***  Patient Active Problem List   Diagnosis Date Noted  . Allergic rhinitis 05/16/2014  . Sleep pattern disturbance 11/23/2013  . Dyspepsia 05/30/2013  . Hypercholesteremia 04/05/2012  . HTN, goal below 140/80 04/05/2012     Past Medical History:  Diagnosis Date  . Allergy   . Anemia   . Hyperlipidemia   . Hypertension      Past Surgical History:  Procedure Laterality Date  . ABDOMINAL HYSTERECTOMY    . PARTIAL HYSTERECTOMY    . TUBAL LIGATION       Family History  Problem  Relation Age of Onset  . Heart attack Mother   . Heart disease Mother        Heart attack  . Hypertension Mother   . Stroke Mother   . Stroke Father   . Stroke Sister   . Stroke Brother      Social History   Socioeconomic History  . Marital status: Divorced    Spouse name: n/a  . Number of children: 2  . Years of education: Not on file  . Highest education level: Bachelor's degree (e.g., BA, AB, BS)  Social Needs  . Financial resource  strain: Not hard at all  . Food insecurity - worry: Never true  . Food insecurity - inability: Never true  . Transportation needs - medical: No  . Transportation needs - non-medical: No  Occupational History  . 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 Smoking status: Never Smoker  . Smokeless tobacco: Never Used  Substance and Sexual Activity  . Alcohol use: No    Alcohol/week: 0.0 oz  . Drug use: No  . Sexual activity: No  Other Topics Concern  . Not on file  Social History Narrative   Her son lives with her.     No Known Allergies   Prior to Admission medications   Medication Sig Start Date End Date Taking? Authorizing Provider  amLODipine (NORVASC) 5 MG tablet TAKE 1 TABLET BY MOUTH EVERY DAY 08/02/17  Yes Jeffery, Chelle, PA-C  cetirizine (ZYRTEC) 10 MG tablet Take 10 mg by mouth daily.   Yes [provider]  famotidine (PEPCID) 20 MG tablet TAKE 1 TABLET TWICE A DAY 05/31/17  Yes Jeffery, Chelle, PA-C  hydrochlorothiazide (MICROZIDE) 12.5 MG capsule TAKE ONE CAPSULE BY MOUTH EVERY DAY 03/07/17  Yes Jeffery, Chelle, PA-C  meclizine (ANTIVERT) 12.5 MG tablet Take 1 tablet (12.5 mg total) by mouth 3 (three) times daily as needed for dizziness. 07/03/15  Yes Robyn Haber, MD  Multiple Vitamins-Minerals (MULTIVITAMIN WITH MINERALS) tablet Take 1 tablet by mouth daily.   Yes [provider]  simvastatin (ZOCOR) 40 MG tablet TAKE 1 TABLET AT BEDTIME 03/13/17  Yes Harrison Mons, PA-C     Depression screen [facility] 2/9 08/05/2017 08/05/2017 02/03/2017 02/01/2017 08/03/2016  Decreased Interest 0 0 0 0 0  Down, Depressed, Hopeless 0 0 0 0 0  PHQ - 2 Score 0 0 0 0 0     Fall Risk  08/05/2017 08/05/2017 02/03/2017 02/01/2017 08/03/2016  Falls in the past year? No No No No No      PHYSICAL EXAM: BP 136/88 (BP Location: Right Arm, Patient Position: Sitting, Cuff Size: Normal)   Pulse 77   Temp 98.2 F (36.8 C) (Oral)   Resp 18   Ht 5'  4" (1.626 m)   Wt 125 lb 3.2 oz (56.8 kg)   SpO2 99%   BMI 21.49 kg/m    Wt Readings from Last 3 Encounters:  08/05/17 125 lb 3.2 oz (56.8 kg)  08/05/17 125 lb 2 oz (56.8 kg)  02/03/17 124 lb (56.2 kg)      Visual Acuity Screening   Right eye Left eye Both eyes  Without correction:     With correction: 20/20 20/20 20/20   Hearing Screening Comments: Pt could hear whispered colors for whisper test.     Physical Exam   Education/Counseling provided regarding diet and exercise, prevention of chronic diseases, smoking/tobacco cessation, if applicable, and reviewed "Covered Medicare Preventive Services."   ASSESSMENT/PLAN: ***

## 2017-08-05 NOTE — Assessment & Plan Note (Signed)
Stop famotidine, though doubt this is the source. Await lab results. Consider referral to cardiology.

## 2017-08-05 NOTE — Assessment & Plan Note (Signed)
Infrequent symptoms. Continue PRN meclizine.

## 2017-08-05 NOTE — Assessment & Plan Note (Signed)
Inadequate control on famotidine, and possible cause of tinnitus and rapid heart rate. Change to pantoprazole.

## 2017-08-05 NOTE — Assessment & Plan Note (Signed)
Await lab results. Continue simvastatin. Adjust dose as indicated by lab results.

## 2017-08-05 NOTE — Progress Notes (Signed)
Subjective:   Kristy [name] is a 71 y.o. female who presents for Medicare Annual (Subsequent) preventive examination.  Review of Systems:  N/A Cardiac Risk Factors include: advanced age (>9men, >56 women);dyslipidemia;hypertension     Objective:     Vitals: BP 136/88   Pulse 77   Ht 5\' 4"  (1.626 m)   Wt 125 lb 2 oz (56.8 kg)   SpO2 99%   BMI 21.48 kg/m   Body mass index is 21.48 kg/m.   Tobacco Social History   Tobacco Use  Smoking Status Never Smoker  Smokeless Tobacco Never Used     Counseling given: Not Answered   Past Medical History:  Diagnosis Date  . Allergy   . Anemia   . Hyperlipidemia   . Hypertension    Past Surgical History:  Procedure Laterality Date  . ABDOMINAL HYSTERECTOMY    . PARTIAL HYSTERECTOMY    . TUBAL LIGATION     Family History  Problem Relation Age of Onset  . Heart attack Mother   . Heart disease Mother        Heart attack  . Hypertension Mother   . Stroke Mother   . Stroke Father   . Stroke Sister   . Stroke Brother    Social History   Substance and Sexual Activity  Sexual Activity No    Outpatient Encounter Medications as of 08/05/2017  Medication Sig  . amLODipine (NORVASC) 5 MG tablet TAKE 1 TABLET BY MOUTH EVERY DAY  . cetirizine (ZYRTEC) 10 MG tablet Take 10 mg by mouth daily.  . famotidine (PEPCID) 20 MG tablet TAKE 1 TABLET TWICE A DAY  . hydrochlorothiazide (MICROZIDE) 12.5 MG capsule TAKE ONE CAPSULE BY MOUTH EVERY DAY  . meclizine (ANTIVERT) 12.5 MG tablet Take 1 tablet (12.5 mg total) by mouth 3 (three) times daily as needed for dizziness.  . Multiple Vitamins-Minerals (MULTIVITAMIN WITH MINERALS) tablet Take 1 tablet by mouth daily.  . simvastatin (ZOCOR) 40 MG tablet TAKE 1 TABLET AT BEDTIME  . [DISCONTINUED] famotidine (PEPCID) 20 MG tablet TAKE 1 TABLET (20 MG TOTAL) BY MOUTH 2 (TWO) TIMES DAILY   No facility-administered encounter medications on file as of 08/05/2017.     Activities of Daily  Living In your present state of health, do you have any difficulty performing the following activities: 08/05/2017 02/01/2017  Hearing? N N  Vision? N N  Difficulty concentrating or making decisions? N N  Walking or climbing stairs? N N  Dressing or bathing? N N  Doing errands, shopping? N N  Preparing Food and eating ? N -  Using the Toilet? N -  In the past six months, have you accidently leaked urine? N -  Do you have problems with loss of bowel control? N -  Managing your Medications? N -  Managing your Finances? N -  Housekeeping or managing your Housekeeping? N -  Some recent data might be hidden    Patient Care Team: Harrison Mons, PA-C as PCP - General (Family Medicine)    Assessment:     Exercise Activities and Dietary recommendations Current Exercise Habits: The patient does not participate in regular exercise at present, Exercise limited by: None identified  Goals    . Exercise 3x per week (30 min per time)     Patient wants to start back walking 2 miles 5 days a week like she was doing in the past.       Fall Risk Fall Risk  08/05/2017 02/03/2017  02/01/2017 08/03/2016 04/05/2016  Falls in the past year? No No No No No   Depression Screen PHQ 2/9 Scores 08/05/2017 02/03/2017 02/01/2017 08/03/2016  PHQ - 2 Score 0 0 0 0     Cognitive Function     6CIT Screen 08/05/2017  What Year? 0 points  What month? 0 points  What time? 0 points  Count back from 20 0 points  Months in reverse 0 points  Repeat phrase 0 points  Total Score 0    Immunization History  Administered Date(s) Administered  . Influenza Split 07/12/2012, 06/25/2014, 07/19/2016  . Influenza,inj,Quad PF,6+ Mos 07/03/2015  . Pneumococcal Conjugate-13 07/22/2015  . Pneumococcal Polysaccharide-23 05/03/2013  . Tdap 03/31/2012  . Zoster 09/20/2009   Screening Tests Health Maintenance  Topic Date Due  . MAMMOGRAM  08/05/2018 (Originally 11/27/2015)  . COLONOSCOPY  08/05/2018 (Originally  04/24/2017)  . TETANUS/TDAP  03/31/2022  . INFLUENZA VACCINE  Completed  . DEXA SCAN  Completed  . Hepatitis C Screening  Completed  . PNA vac Low Risk Adult  Completed      Plan:   I have personally reviewed and noted the following in the patient's chart:   . Medical and social history . Use of alcohol, tobacco or illicit drugs  . Current medications and supplements . Functional ability and status . Nutritional status . Physical activity . Advanced directives . List of other physicians . Hospitalizations, surgeries, and ER visits in previous 12 months . Vitals . Screenings to include cognitive, depression, and falls . Referrals and appointments  In addition, I have reviewed and discussed with patient certain preventive protocols, quality metrics, and best practice recommendations. A written personalized care plan for preventive services as well as general preventive health recommendations were provided to patient.    Patient declined colonoscopy. Patient will call and schedule mammogram at a later date when her schedule allows it.  1. Encounter for Medicare annual wellness exam    Andrez Grime, LPN  02/54/2706

## 2017-08-05 NOTE — Assessment & Plan Note (Signed)
New, mild. Await CBC results.

## 2017-08-05 NOTE — Patient Instructions (Addendum)
Kristy [name] , Thank you for taking time to come for your Medicare Wellness Visit. I appreciate your ongoing commitment to your health goals. Please review the following plan we discussed and let me know if I can assist you in the future.   Screening recommendations/referrals: Colonoscopy: declined  Mammogram: declined at the moment, you will have this scheduled in the near future and let us know. Bone Density: up to date, next due 09/01/2020 Recommended yearly ophthalmology/optometry visit for glaucoma screening and checkup Recommended yearly dental visit for hygiene and checkup  Vaccinations: Influenza vaccine: up to date  Pneumococcal vaccine: up to date Tdap vaccine: up to date, next due 03/31/2022 Shingles vaccine: up to date, Check with pharmacy about receiving Shingrix vaccine    Advanced directives:Advance directive discussed with you today. Even though you declined this today please call our office should you change your mind and we can give you the proper paperwork for you to fill out.    Conditions/risks identified: Try to start back walking 2 miles, 5 days a week like you were doing in the past.   Next appointment: today @ 3:20 pm with [name] 65 Years and Older, Female Preventive care refers to lifestyle choices and visits with your health care provider that can promote health and wellness. What does preventive care include?  A yearly physical exam. This is also called an annual well check.  Dental exams once or twice a year.  Routine eye exams. Ask your health care provider how often you should have your eyes checked.  Personal lifestyle choices, including:  Daily care of your teeth and gums.  Regular physical activity.  Eating a healthy diet.  Avoiding tobacco and drug use.  Limiting alcohol use.  Practicing safe sex.  Taking low-dose aspirin every day.  Taking vitamin and mineral supplements as recommended by your health care  provider. What happens during an annual well check? The services and screenings done by your health care provider during your annual well check will depend on your age, overall health, lifestyle risk factors, and family history of disease. Counseling  Your health care provider may ask you questions about your:  Alcohol use.  Tobacco use.  Drug use.  Emotional well-being.  Home and relationship well-being.  Sexual activity.  Eating habits.  History of falls.  Memory and ability to understand (cognition).  Work and work Statistician.  Reproductive health. Screening  You may have the following tests or measurements:  Height, weight, and BMI.  Blood pressure.  Lipid and cholesterol levels. These may be checked every 5 years, or more frequently if you are over 25 years old.  Skin check.  Lung cancer screening. You may have this screening every year starting at age 38 if you have a 30-pack-year history of smoking and currently smoke or have quit within the past 15 years.  Fecal occult blood test (FOBT) of the stool. You may have this test every year starting at age 16.  Flexible sigmoidoscopy or colonoscopy. You may have a sigmoidoscopy every 5 years or a colonoscopy every 10 years starting at age 40.  Hepatitis C blood test.  Hepatitis B blood test.  Sexually transmitted disease (STD) testing.  Diabetes screening. This is done by checking your blood sugar (glucose) after you have not eaten for a while (fasting). You may have this done every 1-3 years.  Bone density scan. This is done to screen for osteoporosis. You may have this done starting at age  69.  Mammogram. This may be done every 1-2 years. Talk to your health care provider about how often you should have regular mammograms. Talk with your health care provider about your test results, treatment options, and if necessary, the need for more tests. Vaccines  Your health care provider may recommend certain  vaccines, such as:  Influenza vaccine. This is recommended every year.  Tetanus, diphtheria, and acellular pertussis (Tdap, Td) vaccine. You may need a Td booster every 10 years.  Zoster vaccine. You may need this after age 18.  Pneumococcal 13-valent conjugate (PCV13) vaccine. One dose is recommended after age 8.  Pneumococcal polysaccharide (PPSV23) vaccine. One dose is recommended after age 30. Talk to your health care provider about which screenings and vaccines you need and how often you need them. This information is not intended to replace advice given to you by your health care provider. Make sure you discuss any questions you have with your health care provider. Document Released: 10/03/2015 Document Revised: 05/26/2016 Document Reviewed: 07/08/2015 Elsevier Interactive Patient Education  2017 [place] Prevention in the Home Falls can cause injuries. They can happen to people of all ages. There are many things you can do to make your home safe and to help prevent falls. What can I do on the outside of my home?  Regularly fix the edges of walkways and driveways and fix any cracks.  Remove anything that might make you trip as you walk through a door, such as a raised step or threshold.  Trim any bushes or trees on the path to your home.  Use bright outdoor lighting.  Clear any walking paths of anything that might make someone trip, such as rocks or tools.  Regularly check to see if handrails are loose or broken. Make sure that both sides of any steps have handrails.  Any raised decks and porches should have guardrails on the edges.  Have any leaves, snow, or ice cleared regularly.  Use sand or salt on walking paths during winter.  Clean up any spills in your garage right away. This includes oil or grease spills. What can I do in the bathroom?  Use night lights.  Install grab bars by the toilet and in the tub and shower. Do not use towel bars as grab  bars.  Use non-skid mats or decals in the tub or shower.  If you need to sit down in the shower, use a plastic, non-slip stool.  Keep the floor dry. Clean up any water that spills on the floor as soon as it happens.  Remove soap buildup in the tub or shower regularly.  Attach bath mats securely with double-sided non-slip rug tape.  Do not have throw rugs and other things on the floor that can make you trip. What can I do in the bedroom?  Use night lights.  Make sure that you have a light by your bed that is easy to reach.  Do not use any sheets or blankets that are too big for your bed. They should not hang down onto the floor.  Have a firm chair that has side arms. You can use this for support while you get dressed.  Do not have throw rugs and other things on the floor that can make you trip. What can I do in the kitchen?  Clean up any spills right away.  Avoid walking on wet floors.  Keep items that you use a lot in easy-to-reach places.  If you need  to reach something above you, use a strong step stool that has a grab bar.  Keep electrical cords out of the way.  Do not use floor polish or wax that makes floors slippery. If you must use wax, use non-skid floor wax.  Do not have throw rugs and other things on the floor that can make you trip. What can I do with my stairs?  Do not leave any items on the stairs.  Make sure that there are handrails on both sides of the stairs and use them. Fix handrails that are broken or loose. Make sure that handrails are as long as the stairways.  Check any carpeting to make sure that it is firmly attached to the stairs. Fix any carpet that is loose or worn.  Avoid having throw rugs at the top or bottom of the stairs. If you do have throw rugs, attach them to the floor with carpet tape.  Make sure that you have a light switch at the top of the stairs and the bottom of the stairs. If you do not have them, ask someone to add them for  you. What else can I do to help prevent falls?  Wear shoes that:  Do not have high heels.  Have rubber bottoms.  Are comfortable and fit you well.  Are closed at the toe. Do not wear sandals.  If you use a stepladder:  Make sure that it is fully opened. Do not climb a closed stepladder.  Make sure that both sides of the stepladder are locked into place.  Ask someone to hold it for you, if possible.  Clearly mark and make sure that you can see:  Any grab bars or handrails.  First and last steps.  Where the edge of each step is.  Use tools that help you move around (mobility aids) if they are needed. These include:  Canes.  Walkers.  Scooters.  Crutches.  Turn on the lights when you go into a dark area. Replace any light bulbs as soon as they burn out.  Set up your furniture so you have a clear path. Avoid moving your furniture around.  If any of your floors are uneven, fix them.  If there are any pets around you, be aware of where they are.  Review your medicines with your doctor. Some medicines can make you feel dizzy. This can increase your chance of falling. Ask your doctor what other things that you can do to help prevent falls. This information is not intended to replace advice given to you by your health care provider. Make sure you discuss any questions you have with your health care provider. Document Released: 07/03/2009 Document Revised: 02/12/2016 Document Reviewed: 10/11/2014 Elsevier Interactive Patient Education  2017 [employer].

## 2017-08-05 NOTE — Assessment & Plan Note (Signed)
Stop famotidine, as she is concerned that it may be the cause. If no improvement, consider audiology evaluation.

## 2017-08-05 NOTE — Progress Notes (Signed)
Patient ID: Kristy [name], female    DOB: 12-04-1945, 71 y.o.   MRN: [id number]  PCP: Harrison Mons, PA-C  Chief Complaint  Patient presents with  . Annual Exam    Medicare CPE    Subjective:   Presents for evaluation of HTN. Her Medicare Annual Wellness Exam was performed earlier today by Dewitt Hoes, [place]. I last saw her 08/03/2016 for AWV.  Colonoscopy-[facility]. Has not scheduled follow-up, and doesn't want to. mammo-is planning to schedule, last done at [facility] in 2015.  Uses meclizine PRN. Has rare episodes of dizziness, but requests a refill, as her supply, last filled 2016, is long expired.  Having more heartburn and sour belches x several weeks. Uses famotidine.  Tingling on the bottom of the feet. Thought it might be her shoes, but different shoes doesn't make a difference.  Occasional rapid heart rate. Read that amlodipine and famotidine can cause rapid heart beat. Occurs 1-2 times/week, lasting 90 seconds to 2 minutes. No associated CP or SOB.  Ringing in the ears, R>L. Read that HCTZ can cause that. No hearing loss. Hearing screen today was normal. History of excessive RIGHT ear wax.  Home BP 113/74-94, only 2 readings >130/87  Review of Systems  Constitutional: Negative.   HENT: Negative.   Eyes: Negative.   Respiratory: Negative.   Cardiovascular: Negative.   Gastrointestinal: Negative.   Endocrine: Negative.   Genitourinary: Negative.   Musculoskeletal: Negative.   Skin: Negative.   Allergic/Immunologic: Negative.   Neurological: Positive for dizziness. Negative for tremors, seizures, syncope, facial asymmetry, speech difficulty, weakness, light-headedness, numbness and headaches.  Hematological: Negative.   Psychiatric/Behavioral: Negative.        Patient Active Problem List   Diagnosis Date Noted  . Allergic rhinitis 05/16/2014  . Sleep pattern disturbance 11/23/2013  . Dyspepsia 05/30/2013  . Hypercholesteremia 04/05/2012  . HTN, goal  below 140/80 04/05/2012     Prior to Admission medications   Medication Sig Start Date End Date Taking? Authorizing Provider  amLODipine (NORVASC) 5 MG tablet TAKE 1 TABLET BY MOUTH EVERY DAY 08/02/17  Yes Pricella Gaugh, PA-C  cetirizine (ZYRTEC) 10 MG tablet Take 10 mg by mouth daily.   Yes [provider]  famotidine (PEPCID) 20 MG tablet TAKE 1 TABLET TWICE A DAY 05/31/17  Yes Anetha Slagel, PA-C  hydrochlorothiazide (MICROZIDE) 12.5 MG capsule TAKE ONE CAPSULE BY MOUTH EVERY DAY 03/07/17  Yes Keaja Reaume, PA-C  meclizine (ANTIVERT) 12.5 MG tablet Take 1 tablet (12.5 mg total) by mouth 3 (three) times daily as needed for dizziness. 07/03/15  Yes Robyn Haber, MD  Multiple Vitamins-Minerals (MULTIVITAMIN WITH MINERALS) tablet Take 1 tablet by mouth daily.   Yes [provider]  simvastatin (ZOCOR) 40 MG tablet TAKE 1 TABLET AT BEDTIME 03/13/17  Yes Taleshia Luff, PA-C     No Known Allergies     Objective:  Physical Exam  Constitutional: She is oriented to person, place, and time. Vital signs are normal. She appears well-developed and well-nourished. She is active and cooperative. No distress.  BP 136/88 (BP Location: Right Arm, Patient Position: Sitting, Cuff Size: Normal)   Pulse 77   Temp 98.2 F (36.8 C) (Oral)   Resp 18   Ht 5\' 4"  (1.626 m)   Wt 125 lb 3.2 oz (56.8 kg)   SpO2 99%   BMI 21.49 kg/m    HENT:  Head: Normocephalic and atraumatic.  Right Ear: Hearing, tympanic membrane, external ear and ear canal normal.  No foreign bodies.  Left Ear: Hearing, tympanic membrane, external ear and ear canal normal. No foreign bodies.  Nose: Nose normal.  Mouth/Throat: Uvula is midline, oropharynx is clear and moist and mucous membranes are normal. No oral lesions. Normal dentition. No dental abscesses or uvula swelling. No oropharyngeal exudate.  Eyes: Conjunctivae, EOM and lids are normal. Pupils are equal, round, and reactive to light. Right eye  exhibits no discharge. Left eye exhibits no discharge. No scleral icterus.  Fundoscopic exam:      The right eye shows no arteriolar narrowing, no AV nicking, no exudate, no hemorrhage and no papilledema. The right eye shows red reflex.       The left eye shows no arteriolar narrowing, no AV nicking, no exudate, no hemorrhage and no papilledema. The left eye shows red reflex.  Neck: Trachea normal, normal range of motion and full passive range of motion without pain. Neck supple. No spinous process tenderness and no muscular tenderness present. No thyroid mass and no thyromegaly present.  Cardiovascular: Normal rate, regular rhythm, normal heart sounds, intact distal pulses and normal pulses.  Pulmonary/Chest: Effort normal and breath sounds normal. Right breast exhibits no inverted nipple, no mass, no nipple discharge, no skin change and no tenderness. Left breast exhibits no inverted nipple, no mass, no nipple discharge, no skin change and no tenderness. Breasts are symmetrical.  Musculoskeletal: She exhibits no edema or tenderness.       Cervical back: Normal.       Thoracic back: Normal.       Lumbar back: Normal.  Lymphadenopathy:       Head (right side): No tonsillar, no preauricular, no posterior auricular and no occipital adenopathy present.       Head (left side): No tonsillar, no preauricular, no posterior auricular and no occipital adenopathy present.    She has no cervical adenopathy.       Right: No supraclavicular adenopathy present.       Left: No supraclavicular adenopathy present.  Neurological: She is alert and oriented to person, place, and time. She has normal strength and normal reflexes. No cranial nerve deficit. She exhibits normal muscle tone. Coordination and gait normal.  Skin: Skin is warm, dry and intact. No rash noted. She is not diaphoretic. No cyanosis or erythema. Nails show no clubbing.  Psychiatric: She has a normal mood and affect. Her speech is normal and  behavior is normal. Judgment and thought content normal.    Wt Readings from Last 3 Encounters:  08/05/17 125 lb 3.2 oz (56.8 kg)  08/05/17 125 lb 2 oz (56.8 kg)  02/03/17 124 lb (56.2 kg)       Assessment & Plan:   Problem List Items Addressed This Visit    Hypercholesteremia    Await lab results. Continue simvastatin. Adjust dose as indicated by lab results.      Relevant Medications   amLODipine (NORVASC) 5 MG tablet   Other Relevant Orders   Comprehensive metabolic panel   Lipid panel   HTN, goal below 140/80 - Primary    Controlled. Continue current treatment.      Relevant Medications   amLODipine (NORVASC) 5 MG tablet   Other Relevant Orders   CBC with Differential/Platelet   Comprehensive metabolic panel   TSH   Urinalysis, dipstick only   Dyspepsia    Inadequate control on famotidine, and possible cause of tinnitus and rapid heart rate. Change to pantoprazole.      Relevant Medications  pantoprazole (PROTONIX) 40 MG tablet   Tinnitus    Stop famotidine, as she is concerned that it may be the cause. If no improvement, consider audiology evaluation.      Paresthesia    New, mild. Await CBC results.       Dizziness and giddiness    Infrequent symptoms. Continue PRN meclizine.      Relevant Medications   meclizine (ANTIVERT) 12.5 MG tablet   History of irregular heartbeat    Stop famotidine, though doubt this is the source. Await lab results. Consider referral to cardiology.       Other Visit Diagnoses    Screening for colon cancer       Relevant Orders   Ambulatory referral to Gastroenterology   Encounter for screening mammogram for breast cancer       Relevant Orders   MM DIGITAL SCREENING BILATERAL       Return in about 6 months (around 02/02/2018) for re-evaluation of blood pressure.   Fara Chute, PA-C [facility] [place]

## 2017-08-06 LAB — COMPREHENSIVE METABOLIC PANEL
ALBUMIN: 4.9 g/dL — AB (ref 3.6–4.8)
ALK PHOS: 75 IU/L (ref 39–117)
ALT: 18 IU/L (ref 0–32)
AST: 24 IU/L (ref 0–40)
Albumin/Globulin Ratio: 1.5 (ref 1.2–2.2)
BILIRUBIN TOTAL: 0.6 mg/dL (ref 0.0–1.2)
BUN / CREAT RATIO: 10 — AB (ref 12–28)
BUN: 7 mg/dL — AB (ref 8–27)
CHLORIDE: 102 mmol/L (ref 96–106)
CO2: 25 mmol/L (ref 20–29)
CREATININE: 0.73 mg/dL (ref 0.57–1.00)
Calcium: 10.2 mg/dL (ref 8.7–10.3)
GFR calc Af Amer: 97 mL/min/{1.73_m2} (ref >59)
GFR calc non Af Amer: 84 mL/min/{1.73_m2} (ref >59)
GLUCOSE: 87 mg/dL (ref 65–99)
Globulin, Total: 3.3 g/dL (ref 1.5–4.5)
Potassium: 3.6 mmol/L (ref 3.5–5.2)
Sodium: 143 mmol/L (ref 134–144)
Total Protein: 8.2 g/dL (ref 6.0–8.5)

## 2017-08-06 LAB — CBC WITH DIFFERENTIAL/PLATELET
BASOS ABS: 0 10*3/uL (ref 0.0–0.2)
Basos: 1 %
EOS (ABSOLUTE): 0 10*3/uL (ref 0.0–0.4)
Eos: 0 %
HEMOGLOBIN: 13.3 g/dL (ref 11.1–15.9)
Hematocrit: 40.7 % (ref 34.0–46.6)
IMMATURE GRANS (ABS): 0 10*3/uL (ref 0.0–0.1)
Immature Granulocytes: 0 %
LYMPHS ABS: 2.2 10*3/uL (ref 0.7–3.1)
Lymphs: 40 %
MCH: 29 pg (ref 26.6–33.0)
MCHC: 32.7 g/dL (ref 31.5–35.7)
MCV: 89 fL (ref 79–97)
MONOCYTES: 8 %
Monocytes Absolute: 0.4 10*3/uL (ref 0.1–0.9)
Neutrophils Absolute: 2.8 10*3/uL (ref 1.4–7.0)
Neutrophils: 51 %
PLATELETS: 244 10*3/uL (ref 150–379)
RBC: 4.58 x10E6/uL (ref 3.77–5.28)
RDW: 14.1 % (ref 12.3–15.4)
WBC: 5.5 10*3/uL (ref 3.4–10.8)

## 2017-08-06 LAB — LIPID PANEL
CHOLESTEROL TOTAL: 204 mg/dL — AB (ref 100–199)
Chol/HDL Ratio: 2.2 ratio (ref 0.0–4.4)
HDL: 92 mg/dL (ref >39)
LDL CALC: 104 mg/dL — AB (ref 0–99)
TRIGLYCERIDES: 42 mg/dL (ref 0–149)
VLDL CHOLESTEROL CAL: 8 mg/dL (ref 5–40)

## 2017-08-06 LAB — URINALYSIS, DIPSTICK ONLY
BILIRUBIN UA: NEGATIVE
GLUCOSE, UA: NEGATIVE
Ketones, UA: NEGATIVE
Nitrite, UA: NEGATIVE
PROTEIN UA: NEGATIVE
RBC UA: NEGATIVE
SPEC GRAV UA: 1.01 (ref 1.005–1.030)
Urobilinogen, Ur: 0.2 mg/dL (ref 0.2–1.0)
pH, UA: 7 (ref 5.0–7.5)

## 2017-08-06 LAB — TSH: TSH: 1.55 u[IU]/mL (ref 0.450–4.500)

## 2017-08-14 ENCOUNTER — Encounter: Payer: Self-pay | Admitting: Physician Assistant

## 2017-08-18 DIAGNOSIS — Z1231 Encounter for screening mammogram for malignant neoplasm of breast: Secondary | ICD-10-CM | POA: Diagnosis not present

## 2017-08-18 LAB — HM MAMMOGRAPHY

## 2017-08-27 ENCOUNTER — Other Ambulatory Visit: Payer: Self-pay | Admitting: Physician Assistant

## 2017-08-27 DIAGNOSIS — Z8679 Personal history of other diseases of the circulatory system: Secondary | ICD-10-CM

## 2017-08-27 DIAGNOSIS — I10 Essential (primary) hypertension: Secondary | ICD-10-CM

## 2017-10-06 ENCOUNTER — Encounter: Payer: Self-pay | Admitting: Physician Assistant

## 2017-11-02 ENCOUNTER — Other Ambulatory Visit: Payer: Self-pay

## 2017-11-02 ENCOUNTER — Telehealth: Payer: Self-pay

## 2017-11-02 DIAGNOSIS — E7800 Pure hypercholesterolemia, unspecified: Secondary | ICD-10-CM

## 2017-11-02 MED ORDER — SIMVASTATIN 40 MG PO TABS: 40.0000 mg | ORAL_TABLET | Freq: Every day | ORAL | 0 refills | Status: DC

## 2017-11-02 NOTE — Telephone Encounter (Signed)
Fax from [facility] 90 day refill for Simvastatin 40mg  Tab

## 2017-12-10 ENCOUNTER — Other Ambulatory Visit: Payer: Self-pay | Admitting: Physician Assistant

## 2017-12-10 DIAGNOSIS — K219 Gastro-esophageal reflux disease without esophagitis: Secondary | ICD-10-CM

## 2017-12-20 ENCOUNTER — Encounter: Payer: Self-pay | Admitting: Physician Assistant

## 2017-12-26 ENCOUNTER — Encounter: Payer: Self-pay | Admitting: Physician Assistant

## 2018-01-11 ENCOUNTER — Other Ambulatory Visit: Payer: Self-pay | Admitting: Physician Assistant

## 2018-01-11 DIAGNOSIS — K219 Gastro-esophageal reflux disease without esophagitis: Secondary | ICD-10-CM

## 2018-01-25 ENCOUNTER — Other Ambulatory Visit: Payer: Self-pay | Admitting: Physician Assistant

## 2018-01-25 DIAGNOSIS — E7800 Pure hypercholesterolemia, unspecified: Secondary | ICD-10-CM

## 2018-01-29 ENCOUNTER — Other Ambulatory Visit: Payer: Self-pay | Admitting: Physician Assistant

## 2018-01-29 DIAGNOSIS — K219 Gastro-esophageal reflux disease without esophagitis: Secondary | ICD-10-CM

## 2018-01-31 NOTE — Telephone Encounter (Signed)
Pepcid 20 mg refill request  LOV 08/05/17 with Jacqulynn Cadet.   This medication was discontinued and another medication prescribed however her last refill for the pepcid was 01/12/28 for #30 tablets.   Don't see where this was restarted in the notes.    She has an appt with Chelle on 02/03/18 at 8:20 so did not renew it.  [id number] - [place], [place] - [address]

## 2018-02-03 ENCOUNTER — Ambulatory Visit (INDEPENDENT_AMBULATORY_CARE_PROVIDER_SITE_OTHER): Payer: Medicare Other | Admitting: Physician Assistant

## 2018-02-03 ENCOUNTER — Other Ambulatory Visit: Payer: Self-pay

## 2018-02-03 ENCOUNTER — Encounter: Payer: Self-pay | Admitting: Physician Assistant

## 2018-02-03 VITALS — BP 146/78 | HR 76 | Temp 98.7°F | Resp 16 | Ht 64.0 in | Wt 125.0 lb

## 2018-02-03 DIAGNOSIS — H9313 Tinnitus, bilateral: Secondary | ICD-10-CM | POA: Diagnosis not present

## 2018-02-03 DIAGNOSIS — R202 Paresthesia of skin: Secondary | ICD-10-CM

## 2018-02-03 DIAGNOSIS — I10 Essential (primary) hypertension: Secondary | ICD-10-CM

## 2018-02-03 DIAGNOSIS — E7800 Pure hypercholesterolemia, unspecified: Secondary | ICD-10-CM

## 2018-02-03 DIAGNOSIS — R42 Dizziness and giddiness: Secondary | ICD-10-CM | POA: Diagnosis not present

## 2018-02-03 DIAGNOSIS — K219 Gastro-esophageal reflux disease without esophagitis: Secondary | ICD-10-CM | POA: Diagnosis not present

## 2018-02-03 MED ORDER — AMLODIPINE BESYLATE 5 MG PO TABS: 5.0000 mg | ORAL_TABLET | Freq: Every day | ORAL | 3 refills | Status: DC

## 2018-02-03 MED ORDER — HYDROCHLOROTHIAZIDE 12.5 MG PO CAPS: 12.5000 mg | ORAL_CAPSULE | Freq: Every day | ORAL | 3 refills | Status: DC

## 2018-02-03 MED ORDER — FAMOTIDINE 20 MG PO TABS: 20.0000 mg | ORAL_TABLET | Freq: Two times a day (BID) | ORAL | 3 refills | Status: DC

## 2018-02-03 MED ORDER — SIMVASTATIN 40 MG PO TABS: ORAL_TABLET | ORAL | 3 refills | Status: DC
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
## 2018-02-03 NOTE — Patient Instructions (Signed)
     IF you received an x-ray today, you will receive an invoice from [facility]. Please contact [facility] at [phone] with questions or concerns regarding your invoice.   IF you received labwork today, you will receive an invoice from [facility]. Please contact [facility] at [phone] with questions or concerns regarding your invoice.   Our billing staff will not be able to assist you with questions regarding bills from these companies.  You will be contacted with the lab results as soon as they are available. The fastest way to get your results is to activate your My Chart account. Instructions are located on the last page of this paperwork. If you have not heard from us regarding the results in 2 weeks, please contact this office.     

## 2018-02-04 LAB — CBC WITH DIFFERENTIAL/PLATELET
BASOS ABS: 0 10*3/uL (ref 0.0–0.2)
Basos: 1 %
EOS (ABSOLUTE): 0 10*3/uL (ref 0.0–0.4)
Eos: 1 %
HEMOGLOBIN: 12.8 g/dL (ref 11.1–15.9)
Hematocrit: 38.8 % (ref 34.0–46.6)
Immature Grans (Abs): 0 10*3/uL (ref 0.0–0.1)
Immature Granulocytes: 0 %
LYMPHS ABS: 1.7 10*3/uL (ref 0.7–3.1)
Lymphs: 39 %
MCH: 29.6 pg (ref 26.6–33.0)
MCHC: 33 g/dL (ref 31.5–35.7)
MCV: 90 fL (ref 79–97)
Monocytes Absolute: 0.2 10*3/uL (ref 0.1–0.9)
Monocytes: 5 %
NEUTROS ABS: 2.3 10*3/uL (ref 1.4–7.0)
Neutrophils: 54 %
Platelets: 254 10*3/uL (ref 150–379)
RBC: 4.33 x10E6/uL (ref 3.77–5.28)
RDW: 14.2 % (ref 12.3–15.4)
WBC: 4.3 10*3/uL (ref 3.4–10.8)

## 2018-02-04 LAB — COMPREHENSIVE METABOLIC PANEL
ALBUMIN: 4.7 g/dL (ref 3.5–4.8)
ALT: 17 IU/L (ref 0–32)
AST: 22 IU/L (ref 0–40)
Albumin/Globulin Ratio: 1.6 (ref 1.2–2.2)
Alkaline Phosphatase: 72 IU/L (ref 39–117)
BUN / CREAT RATIO: 9 — AB (ref 12–28)
BUN: 7 mg/dL — AB (ref 8–27)
Bilirubin Total: 0.5 mg/dL (ref 0.0–1.2)
CO2: 23 mmol/L (ref 20–29)
CREATININE: 0.74 mg/dL (ref 0.57–1.00)
Calcium: 10.3 mg/dL (ref 8.7–10.3)
Chloride: 105 mmol/L (ref 96–106)
GFR calc Af Amer: 95 mL/min/{1.73_m2} (ref >59)
GFR, EST NON AFRICAN AMERICAN: 82 mL/min/{1.73_m2} (ref >59)
GLOBULIN, TOTAL: 2.9 g/dL (ref 1.5–4.5)
GLUCOSE: 92 mg/dL (ref 65–99)
Potassium: 4 mmol/L (ref 3.5–5.2)
Sodium: 146 mmol/L — ABNORMAL HIGH (ref 134–144)
Total Protein: 7.6 g/dL (ref 6.0–8.5)

## 2018-02-04 LAB — LIPID PANEL
CHOL/HDL RATIO: 2.4 ratio (ref 0.0–4.4)
Cholesterol, Total: 208 mg/dL — ABNORMAL HIGH (ref 100–199)
HDL: 87 mg/dL (ref >39)
LDL CALC: 110 mg/dL — AB (ref 0–99)
TRIGLYCERIDES: 53 mg/dL (ref 0–149)
VLDL CHOLESTEROL CAL: 11 mg/dL (ref 5–40)

## 2018-02-04 NOTE — Assessment & Plan Note (Signed)
Continue as needed meclizine.

## 2018-02-04 NOTE — Assessment & Plan Note (Signed)
Above goal in the office today but controlled per home recordings.  No changes made today.  Continue amlodipine 5 mg daily, hydrochlorothiazide 12.5 mg daily.

## 2018-02-04 NOTE — Assessment & Plan Note (Signed)
Await lab results.  Continue simvastatin 40 mg daily.  If needs additional lipid lowering, would change to atorvastatin or rosuvastatin.

## 2018-02-04 NOTE — Assessment & Plan Note (Signed)
Stable.  Does not desire additional evaluation or treatment at this time.

## 2018-02-04 NOTE — Assessment & Plan Note (Signed)
Stable.  Desires no additional evaluation or treatment.

## 2018-02-27 ENCOUNTER — Encounter: Payer: Self-pay | Admitting: *Deleted

## 2018-08-02 ENCOUNTER — Telehealth: Payer: Self-pay | Admitting: Family Medicine

## 2018-08-02 NOTE — Telephone Encounter (Signed)
Called and spoke with patient regarding their appt with Timmothy Euler on 08/31/18. I advised that Timmothy Euler was leaving the practice and would need to pick a new primary care provider. I advised of the providers that are accepting new patients. I was able to make an appt with Dr. Nolon Rod on 08/31/18 at 9:20. I advised of time, building number and late policy. Pt acknowledged.

## 2018-08-23 DIAGNOSIS — Z1231 Encounter for screening mammogram for malignant neoplasm of breast: Secondary | ICD-10-CM | POA: Diagnosis not present

## 2018-08-23 LAB — HM MAMMOGRAPHY

## 2018-08-31 ENCOUNTER — Other Ambulatory Visit: Payer: Self-pay

## 2018-08-31 ENCOUNTER — Ambulatory Visit (INDEPENDENT_AMBULATORY_CARE_PROVIDER_SITE_OTHER): Payer: Medicare Other | Admitting: Family Medicine

## 2018-08-31 ENCOUNTER — Ambulatory Visit: Payer: Medicare Other | Admitting: Physician Assistant

## 2018-08-31 ENCOUNTER — Encounter: Payer: Self-pay | Admitting: Family Medicine

## 2018-08-31 VITALS — BP 142/78 | HR 88 | Temp 97.9°F | Resp 17 | Ht 64.0 in | Wt 126.8 lb

## 2018-08-31 DIAGNOSIS — Z1211 Encounter for screening for malignant neoplasm of colon: Secondary | ICD-10-CM | POA: Diagnosis not present

## 2018-08-31 DIAGNOSIS — Z5181 Encounter for therapeutic drug level monitoring: Secondary | ICD-10-CM | POA: Diagnosis not present

## 2018-08-31 DIAGNOSIS — I10 Essential (primary) hypertension: Secondary | ICD-10-CM | POA: Diagnosis not present

## 2018-08-31 DIAGNOSIS — R42 Dizziness and giddiness: Secondary | ICD-10-CM

## 2018-08-31 DIAGNOSIS — E785 Hyperlipidemia, unspecified: Secondary | ICD-10-CM | POA: Insufficient documentation

## 2018-08-31 NOTE — Patient Instructions (Addendum)
     If you have lab work done today you will be contacted with your lab results within the next 2 weeks.  If you have not heard from [place] then please contact us. The fastest way to get your results is to register for My Chart.   IF you received an x-ray today, you will receive an invoice from [facility]. Please contact [facility] at [phone] with questions or concerns regarding your invoice.   IF you received labwork today, you will receive an invoice from [facility]. Please contact [facility] at [phone] with questions or concerns regarding your invoice.   Our billing staff will not be able to assist you with questions regarding bills from these companies.  You will be contacted with the lab results as soon as they are available. The fastest way to get your results is to activate your My Chart account. Instructions are located on the last page of this paperwork. If you have not heard from [place] regarding the results in 2 weeks, please contact this office.     Colorectal Cancer Screening Colorectal cancer screening is a group of tests used to check for colorectal cancer. Colorectal refers to your colon and rectum. Your colon and rectum are located at the end of your large intestine and carry your bowel movements out of your body. Why is colorectal cancer screening done? It is common for abnormal growths (polyps) to form in the lining of your colon, especially as you get older. These polyps can be cancerous or become cancerous. If colorectal cancer is found at an early stage, it is treatable. Who should be screened for colorectal cancer? Screening is recommended for all adults at average risk starting at age 66. Tests may be recommended every 1 to 10 years. Your health care provider may recommend earlier or more frequent screening if you have:  A history of colorectal cancer or polyps.  A family member with a history of colorectal cancer or polyps.  Inflammatory bowel  disease, such as ulcerative colitis or Crohn disease.  A type of hereditary colon cancer syndrome.  Colorectal cancer symptoms.  Types of screening tests There are several types of colorectal screening tests. They include:  Guaiac-based fecal occult blood testing.  Fecal immunochemical test (FIT).  Stool DNA test.  Barium enema.  Virtual colonoscopy.  Sigmoidoscopy. During this test, a sigmoidoscope is used to examine your rectum and lower colon. A sigmoidoscope is a flexible tube with a camera that is inserted through your anus into your rectum and lower colon.  Colonoscopy. During this test, a colonoscope is used to examine your entire colon. A colonoscope is a long, thin, flexible tube with a camera. This test examines your entire colon and rectum.  This information is not intended to replace advice given to you by your health care provider. Make sure you discuss any questions you have with your health care provider. Document Released: 02/24/2010 Document Revised: 04/15/2016 Document Reviewed: 12/13/2013 Elsevier Interactive Patient Education  [employer].

## 2018-08-31 NOTE — Assessment & Plan Note (Signed)
Patient's blood pressure is at goal of 139/89 or less. Condition is stable. Continue current medications and treatment plan. I recommend that you exercise for 30-45 minutes 5 days a week. I also recommend a balanced diet with fruits and vegetables every day, lean meats, and little fried foods. The DASH diet (you can find this online) is a good example of this.  

## 2018-08-31 NOTE — Assessment & Plan Note (Signed)
Tolerating statin well. Will check lipid today.

## 2018-08-31 NOTE — Assessment & Plan Note (Signed)
Dizziness improved. Will check lytes today

## 2018-08-31 NOTE — Assessment & Plan Note (Signed)
Will do cologuard for screening

## 2018-08-31 NOTE — Progress Notes (Signed)
Established Patient Office Visit  Subjective:  Patient ID: Kristy [name], female    DOB: Feb 03, 1946  Age: 72 y.o. MRN: [id number]  CC:  Chief Complaint  Patient presents with  . Follow-up    blood pressure    HPI BERNETA SCONYERS presents for blood pressure  Hypertension: Patient here for follow-up of elevated blood pressure. She is exercising and is adherent to low salt diet.  Blood pressure is well controlled at home. Cardiac symptoms none. Patient denies chest pain, chest pressure/discomfort, claudication, dyspnea, exertional chest pressure/discomfort, fatigue, irregular heart beat and lower extremity edema.  Cardiovascular risk factors: advanced age (older than 75 for men, 62 for women), dyslipidemia and hypertension. Use of agents associated with hypertension: none. History of target organ damage: none. BP Readings from Last 3 Encounters:  08/31/18 (!) 142/78  02/03/18 (!) 146/78  08/05/17 136/88   Her home b.p. readings are 128-140/85-91   Dizziness Pt reports that she has not had any further dizzy episode She reports that she takes meclizine as needed for positional vertigo She denies dehydration   Dyslipidemia: Patient presents for evaluation of lipids.  Compliance with treatment thus far has been excellent.  A repeat fasting lipid profile was done.  The patient does not use medications that may worsen dyslipidemias (corticosteroids, progestins, anabolic steroids, diuretics, beta-blockers, amiodarone, cyclosporine, olanzapine). The patient exercises daily.  The patient is not known to have coexisting coronary artery disease.    Colon Cancer Screening she had a colonoscopy in 2013 but declined repeating one even though she was advised to repeat in 5 years. she denies blood in his stool, unexpected weight loss or pain with defecation No rectal itching she does not smoke she does not have a family history of colon cancer     Past Medical History:  Diagnosis Date    . Allergy   . Anemia   . Hyperlipidemia   . Hypertension     Past Surgical History:  Procedure Laterality Date  . ABDOMINAL HYSTERECTOMY    . PARTIAL HYSTERECTOMY    . TUBAL LIGATION      Family History  Problem Relation Age of Onset  . Heart attack Mother   . Heart disease Mother        Heart attack  . Hypertension Mother   . Stroke Mother   . Stroke Father   . Stroke Sister   . Stroke Brother     Social History   Socioeconomic History  . Marital status: Divorced    Spouse name: n/a  . Number of children: 2  . Years of education: Not on file  . Highest education level: Bachelor's degree (e.g., BA, AB, BS)  Occupational History  . Occupation: retired Chartered certified accountant [employer]  Social Needs  . Financial resource strain: Not hard at all  . Food insecurity:    Worry: Never true    Inability: Never true  . Transportation needs:    Medical: No    Non-medical: No  Tobacco Use  . Smoking status: Never Smoker  . Smokeless tobacco: Never Used  Substance and Sexual Activity  . Alcohol use: No    Alcohol/week: 0.0 standard drinks  . Drug use: No  . Sexual activity: Never  Lifestyle  . Physical activity:    Days per week: 0 days    Minutes per session: 0 min  . Stress: Not at all  Relationships  . Social connections:    Talks on phone: More than  three times a week    Gets together: More than three times a week    Attends religious service: Never    Active member of club or organization: Yes    Attends meetings of clubs or organizations: More than 4 times per year    Relationship status: Divorced  . Intimate partner violence:    Fear of current or ex partner: No    Emotionally abused: No    Physically abused: No    Forced sexual activity: No  Other Topics Concern  . Not on file  Social History Narrative   Her son lives with her.    Outpatient Medications Prior to Visit  Medication Sig Dispense Refill  . amLODipine (NORVASC) 5 MG tablet Take 1  tablet (5 mg total) by mouth daily. 90 tablet 3  . cetirizine (ZYRTEC) 10 MG tablet Take 10 mg by mouth daily.    . famotidine (PEPCID) 20 MG tablet Take 1 tablet (20 mg total) by mouth 2 (two) times daily. 90 tablet 3  . hydrochlorothiazide (MICROZIDE) 12.5 MG capsule Take 1 capsule (12.5 mg total) by mouth daily. 90 capsule 3  . meclizine (ANTIVERT) 12.5 MG tablet Take 1 tablet (12.5 mg total) 3 (three) times daily as needed by mouth for dizziness. 30 tablet 0  . Multiple Vitamins-Minerals (MULTIVITAMIN WITH MINERALS) tablet Take 1 tablet by mouth daily.    . simvastatin (ZOCOR) 40 MG tablet TAKE 1 TABLET BY MOUTH EVERYDAY AT BEDTIME 90 tablet 3   No facility-administered medications prior to visit.     No Known Allergies  ROS Review of Systems Review of Systems  Constitutional: Negative for activity change, appetite change, chills and fever.  HENT: Negative for congestion, nosebleeds, trouble swallowing and voice change.   Respiratory: Negative for cough, shortness of breath and wheezing.   Gastrointestinal: Negative for diarrhea, nausea and vomiting.  Genitourinary: Negative for difficulty urinating, dysuria, flank pain and hematuria.  Musculoskeletal: Negative for back pain, joint swelling and neck pain.  Neurological: Negative for dizziness, speech difficulty, light-headedness and numbness.  See HPI. All other review of systems negative.     Objective:    Physical Exam  BP (!) 142/78 (BP Location: Left Arm, Patient Position: Sitting, Cuff Size: Normal)   Pulse 88   Temp 97.9 F (36.6 C) (Oral)   Resp 17   Ht _0  (1.626 m)   Wt 126 lb 12.8 oz (57.5 kg)   SpO2 99%   BMI 21.77 kg/m  Wt Readings from Last 3 Encounters:  08/31/18 126 lb 12.8 oz (57.5 kg)  02/03/18 125 lb (56.7 kg)  08/05/17 125 lb 3.2 oz (56.8 kg)   Physical Exam  Constitutional: Oriented to person, place, and time. Appears well-developed and well-nourished.  HENT:  Head: Normocephalic and  atraumatic.  Eyes: Conjunctivae and EOM are normal.  Cardiovascular: Normal rate, regular rhythm, normal heart sounds and intact distal pulses.  No murmur heard. Pulmonary/Chest: Effort normal and breath sounds normal. No stridor. No respiratory distress. Has no wheezes.  Neurological: Is alert and oriented to person, place, and time.  Skin: Skin is warm. Capillary refill takes less than 2 seconds.  Psychiatric: Has a normal mood and affect. Behavior is normal. Judgment and thought content normal.    Health Maintenance Due  Topic Date Due  . COLONOSCOPY  04/24/2017    There are no preventive care reminders to display for this patient.  Lab Results  Component Value Date   TSH 1.550 08/05/2017  Lab Results  Component Value Date   WBC 4.3 02/03/2018   HGB 12.8 02/03/2018   HCT 38.8 02/03/2018   MCV 90 02/03/2018   PLT 254 02/03/2018   Lab Results  Component Value Date   NA 146 (H) 02/03/2018   K 4.0 02/03/2018   CO2 23 02/03/2018   GLUCOSE 92 02/03/2018   BUN 7 (L) 02/03/2018   CREATININE 0.74 02/03/2018   BILITOT 0.5 02/03/2018   ALKPHOS 72 02/03/2018   AST 22 02/03/2018   ALT 17 02/03/2018   PROT 7.6 02/03/2018   ALBUMIN 4.7 02/03/2018   CALCIUM 10.3 02/03/2018   Lab Results  Component Value Date   CHOL 208 (H) 02/03/2018   Lab Results  Component Value Date   HDL 87 02/03/2018   Lab Results  Component Value Date   LDLCALC 110 (H) 02/03/2018   Lab Results  Component Value Date   TRIG 53 02/03/2018   Lab Results  Component Value Date   CHOLHDL 2.4 02/03/2018   No results found for: HGBA1C    Assessment & Plan:   Problem List Items Addressed This Visit      Cardiovascular and Mediastinum   Essential hypertension - Primary    Patient's blood pressure is at goal of 139/89 or less. Condition is stable. Continue current medications and treatment plan. I recommend that you exercise for 30-45 minutes 5 days a week. I also recommend a balanced diet  with fruits and vegetables every day, lean meats, and little fried foods. The DASH diet (you can find this online) is a good example of this.       Relevant Orders   CMP14+EGFR   Lipid panel   Microalbumin, urine     Other   Screening for colon cancer    Will do cologuard for screening       Relevant Orders   Cologuard   Encounter for medication monitoring   Relevant Orders   CMP14+EGFR   Lipid panel   Microalbumin, urine   Dyslipidemia    Tolerating statin well. Will check lipid today.       Relevant Orders   CMP14+EGFR   Lipid panel   Dizziness    Dizziness improved. Will check lytes today      Relevant Orders   CMP14+EGFR      No orders of the defined types were placed in this encounter.   Follow-up: Return in about 6 months (around 03/02/2019).    Forrest Moron, MD

## 2018-09-01 LAB — CMP14+EGFR
ALBUMIN: 5.1 g/dL — AB (ref 3.5–4.8)
ALK PHOS: 79 IU/L (ref 39–117)
ALT: 21 IU/L (ref 0–32)
AST: 22 IU/L (ref 0–40)
Albumin/Globulin Ratio: 1.6 (ref 1.2–2.2)
BILIRUBIN TOTAL: 0.9 mg/dL (ref 0.0–1.2)
BUN/Creatinine Ratio: 12 (ref 12–28)
BUN: 10 mg/dL (ref 8–27)
CHLORIDE: 101 mmol/L (ref 96–106)
CO2: 23 mmol/L (ref 20–29)
Calcium: 10.5 mg/dL — ABNORMAL HIGH (ref 8.7–10.3)
Creatinine, Ser: 0.85 mg/dL (ref 0.57–1.00)
GFR calc non Af Amer: 70 mL/min/{1.73_m2} (ref >59)
GFR, EST AFRICAN AMERICAN: 80 mL/min/{1.73_m2} (ref >59)
Globulin, Total: 3.1 g/dL (ref 1.5–4.5)
Glucose: 97 mg/dL (ref 65–99)
Potassium: 4.4 mmol/L (ref 3.5–5.2)
Sodium: 143 mmol/L (ref 134–144)
TOTAL PROTEIN: 8.2 g/dL (ref 6.0–8.5)

## 2018-09-01 LAB — LIPID PANEL
Chol/HDL Ratio: 2.3 ratio (ref 0.0–4.4)
Cholesterol, Total: 226 mg/dL — ABNORMAL HIGH (ref 100–199)
HDL: 98 mg/dL (ref >39)
LDL Calculated: 117 mg/dL — ABNORMAL HIGH (ref 0–99)
Triglycerides: 55 mg/dL (ref 0–149)
VLDL Cholesterol Cal: 11 mg/dL (ref 5–40)

## 2018-09-25 DIAGNOSIS — Z1211 Encounter for screening for malignant neoplasm of colon: Secondary | ICD-10-CM | POA: Diagnosis not present

## 2018-09-26 ENCOUNTER — Encounter: Payer: Self-pay | Admitting: Family Medicine

## 2018-09-27 ENCOUNTER — Telehealth: Payer: Self-pay

## 2018-09-27 NOTE — Telephone Encounter (Signed)
Lab letter sent via mail to pt home address. Dgaddy, CMA

## 2018-09-29 LAB — COLOGUARD: Cologuard: NEGATIVE

## 2018-10-20 ENCOUNTER — Other Ambulatory Visit: Payer: Self-pay | Admitting: Family Medicine

## 2018-10-20 DIAGNOSIS — K219 Gastro-esophageal reflux disease without esophagitis: Secondary | ICD-10-CM

## 2019-02-23 ENCOUNTER — Other Ambulatory Visit: Payer: Self-pay | Admitting: Family Medicine

## 2019-02-23 DIAGNOSIS — K219 Gastro-esophageal reflux disease without esophagitis: Secondary | ICD-10-CM

## 2019-02-23 NOTE — Telephone Encounter (Signed)
Requested Prescriptions  Pending Prescriptions Disp Refills  . famotidine (PEPCID) 20 MG tablet [Pharmacy Med Name: FAMOTIDINE 20 MG TABLET] 180 tablet 1    Sig: TAKE 1 TABLET BY MOUTH TWICE A DAY     Gastroenterology:  H2 Antagonists Passed - 02/23/2019 12:12 AM      Passed - Valid encounter within last 12 months    Recent Outpatient Visits          5 months ago Essential hypertension   Primary Care at [facility], Arlie Solomons, MD   1 year ago HTN, goal below 140/80   Primary Care at [facility], [place], [place]   1 year ago HTN, goal below 140/80   Primary Care at [facility], [place], [place]   2 years ago Hematuria, unspecified type   Primary Care at [place], [place] D, PA-C   2 years ago Other fatigue   Primary Care at [place], [place] D, [place]

## 2019-03-13 ENCOUNTER — Telehealth: Payer: Self-pay | Admitting: Family Medicine

## 2019-03-13 NOTE — Telephone Encounter (Signed)
Copied from [place] [id number]. Topic: Appointment Scheduling - Scheduling Inquiry for Clinic >> Mar 13, 2019  1:45 PM Scherrie Gerlach wrote: Reason for CRM: pt called back to ask about her med well visit. Pt did not know her appt was cancelled. Advised pt someone will call her to schedule [facility].

## 2019-03-14 ENCOUNTER — Ambulatory Visit: Payer: Medicare Other | Admitting: Family Medicine

## 2019-03-23 ENCOUNTER — Telehealth: Payer: Self-pay | Admitting: Family Medicine

## 2019-03-23 NOTE — Telephone Encounter (Signed)
Medication Refill - Medication: [id number] - [place], [place] - [address] AT [place]  Has the patient contacted their pharmacy? Yes.   (Agent: If no, request that the patient contact the pharmacy for the refill.) (Agent: If yes, when and what did the pharmacy advise?)  Preferred Pharmacy (with phone number or street name): [id number] - [place], [place]: Please be advised that RX refills may take up to 3 business days. We ask that you follow-up with your pharmacy.

## 2019-03-27 ENCOUNTER — Other Ambulatory Visit: Payer: Self-pay | Admitting: *Deleted

## 2019-03-27 ENCOUNTER — Ambulatory Visit (INDEPENDENT_AMBULATORY_CARE_PROVIDER_SITE_OTHER): Payer: Medicare Other | Admitting: Family Medicine

## 2019-03-27 ENCOUNTER — Other Ambulatory Visit: Payer: Self-pay

## 2019-03-27 VITALS — BP 129/91 | Ht 62.0 in | Wt 125.0 lb

## 2019-03-27 DIAGNOSIS — Z0000 Encounter for general adult medical examination without abnormal findings: Secondary | ICD-10-CM | POA: Diagnosis not present

## 2019-03-27 DIAGNOSIS — I10 Essential (primary) hypertension: Secondary | ICD-10-CM

## 2019-03-27 MED ORDER — HYDROCHLOROTHIAZIDE 12.5 MG PO CAPS: 12.5000 mg | ORAL_CAPSULE | Freq: Every day | ORAL | 0 refills | Status: DC

## 2019-03-27 NOTE — Patient Instructions (Signed)
Thank you for taking time to come for your Medicare Wellness Visit. I appreciate your ongoing commitment to your health goals. Please review the following plan we discussed and let me know if I can assist you in the future.  Kristy Lichter LPN  Preventive Care 71 Years and Older, Female Preventive care refers to lifestyle choices and visits with your health care provider that can promote health and wellness. This includes:  A yearly physical exam. This is also called an annual well check.  Regular dental and eye exams.  Immunizations.  Screening for certain conditions.  Healthy lifestyle choices, such as diet and exercise. What can I expect for my preventive care visit? Physical exam Your health care provider will check:  Height and weight. These may be used to calculate body mass index (BMI), which is a measurement that tells if you are at a healthy weight.  Heart rate and blood pressure.  Your skin for abnormal spots. Counseling Your health care provider may ask you questions about:  Alcohol, tobacco, and drug use.  Emotional well-being.  Home and relationship well-being.  Sexual activity.  Eating habits.  History of falls.  Memory and ability to understand (cognition).  Work and work environment.  Pregnancy and menstrual history. What immunizations do I need?  Influenza (flu) vaccine  This is recommended every year. Tetanus, diphtheria, and pertussis (Tdap) vaccine  You may need a Td booster every 10 years. Varicella (chickenpox) vaccine  You may need this vaccine if you have not already been vaccinated. Zoster (shingles) vaccine  You may need this after age 71. Pneumococcal conjugate (PCV13) vaccine  One dose is recommended after age 71. Pneumococcal polysaccharide (PPSV23) vaccine  One dose is recommended after age 71. Measles, mumps, and rubella (MMR) vaccine  You may need at least one dose of MMR if you were born in 1957 or later. You may also  need a second dose. Meningococcal conjugate (MenACWY) vaccine  You may need this if you have certain conditions. Hepatitis A vaccine  You may need this if you have certain conditions or if you travel or work in places where you may be exposed to hepatitis A. Hepatitis B vaccine  You may need this if you have certain conditions or if you travel or work in places where you may be exposed to hepatitis B. Haemophilus influenzae type b (Hib) vaccine  You may need this if you have certain conditions. You may receive vaccines as individual doses or as more than one vaccine together in one shot (combination vaccines). Talk with your health care provider about the risks and benefits of combination vaccines. What tests do I need? Blood tests  Lipid and cholesterol levels. These may be checked every 5 years, or more frequently depending on your overall health.  Hepatitis C test.  Hepatitis B test. Screening  Lung cancer screening. You may have this screening every year starting at age 55 if you have a 30-pack-year history of smoking and currently smoke or have quit within the past 15 years.  Colorectal cancer screening. All adults should have this screening starting at age 50 and continuing until age 75. Your health care provider may recommend screening at age 45 if you are at increased risk. You will have tests every 1-10 years, depending on your results and the type of screening test.  Diabetes screening. This is done by checking your blood sugar (glucose) after you have not eaten for a while (fasting). You may have this done every 1-3   years.  Mammogram. This may be done every 1-2 years. Talk with your health care provider about how often you should have regular mammograms.  BRCA-related cancer screening. This may be done if you have a family history of breast, ovarian, tubal, or peritoneal cancers. Other tests  Sexually transmitted disease (STD) testing.  Bone density scan. This is done  to screen for osteoporosis. You may have this done starting at age 71. Follow these instructions at home: Eating and drinking  Eat a diet that includes fresh fruits and vegetables, whole grains, lean protein, and low-fat dairy products. Limit your intake of foods with high amounts of sugar, saturated fats, and salt.  Take vitamin and mineral supplements as recommended by your health care provider.  Do not drink alcohol if your health care provider tells you not to drink.  If you drink alcohol: ? Limit how much you have to 0-1 drink a day. ? Be aware of how much alcohol is in your drink. In the [place] one drink equals one 12 oz bottle of beer (355 mL), one 5 oz glass of wine (148 mL), or one 1 oz glass of hard liquor (44 mL). Lifestyle  Take daily care of your teeth and gums.  Stay active. Exercise for at least 30 minutes on 5 or more days each week.  Do not use any products that contain nicotine or tobacco, such as cigarettes, e-cigarettes, and chewing tobacco. If you need help quitting, ask your health care provider.  If you are sexually active, practice safe sex. Use a condom or other form of protection in order to prevent STIs (sexually transmitted infections).  Talk with your health care provider about taking a low-dose aspirin or statin. What's next?  Go to your health care provider once a year for a well check visit.  Ask your health care provider how often you should have your eyes and teeth checked.  Stay up to date on all vaccines. This information is not intended to replace advice given to you by your health care provider. Make sure you discuss any questions you have with your health care provider. Document Released: 10/03/2015 Document Revised: 08/31/2018 Document Reviewed: 08/31/2018 Elsevier Patient Education  2020 [employer].

## 2019-03-27 NOTE — Progress Notes (Signed)
Presents today for [employer] Visit   Date of last exam:08/31/2018  Interpreter used for this visit?  No  I connected with  Kristy [name] on 03/27/19 by a video enabled telemedicine application and verified that I am speaking with the correct person using two identifiers.  Patient Care Team: Forrest Moron, MD as PCP - General (Internal Medicine)   Other items to address today:  Bone Density: up to date, next due 09/01/2020  Recommended yearly ophthalmology/optometry visit for glaucoma screening and checkup Recommended yearly dental visit for hygiene and checkup  Patient will schedule follow up with Dr. Nolon Rod.    Other Screening: Last screening for diabetes:  Last lipid screening: 09/08/2018  ADVANCE DIRECTIVES: Discussed: yes On File:no Materials Provided: yes (mailed)  Immunization status:  Immunization History  Administered Date(s) Administered  . Influenza Split 07/12/2012, 06/25/2014, 07/19/2016  . Influenza, High Dose Seasonal PF 07/14/2018  . Influenza,inj,Quad PF,6+ Mos 07/03/2015  . Influenza-Unspecified 07/06/2017  . Pneumococcal Conjugate-13 07/22/2015  . Pneumococcal Polysaccharide-23 05/03/2013  . Tdap 03/31/2012  . Zoster 09/20/2009     There are no preventive care reminders to display for this patient.   Functional Status Survey: Is the patient deaf or have difficulty hearing?: No Does the patient have difficulty seeing, even when wearing glasses/contacts?: No Does the patient have difficulty concentrating, remembering, or making decisions?: No Does the patient have difficulty walking or climbing stairs?: No Does the patient have difficulty dressing or bathing?: No Does the patient have difficulty doing errands alone such as visiting a doctor's office or shopping?: No   6CIT Screen 03/27/2019 08/05/2017  What Year? 0 points 0 points  What month? 0 points 0 points  What time? 0 points 0 points  Count back from 20 0  points 0 points  Months in reverse 0 points 0 points  Repeat phrase 0 points 0 points  Total Score 0 0        Clinical Support from 03/27/2019 in Primary Care at [facility]  AUDIT-C Score  0       Home Environment:   Live one story home  No scattered rugs Yes grab in shower Adequate lighting/no clutter   Patient Active Problem List   Diagnosis Date Noted  . Screening for colon cancer 08/31/2018  . Encounter for medication monitoring 08/31/2018  . Dyslipidemia 08/31/2018  . Dizziness 08/31/2018  . Essential hypertension 08/31/2018  . Tinnitus 08/05/2017  . Paresthesia 08/05/2017  . History of irregular heartbeat 08/05/2017  . Allergic rhinitis 05/16/2014  . Sleep pattern disturbance 11/23/2013  . Dyspepsia 05/30/2013  . Hypercholesteremia 04/05/2012     Past Medical History:  Diagnosis Date  . Allergy   . Anemia   . Hyperlipidemia   . Hypertension      Past Surgical History:  Procedure Laterality Date  . ABDOMINAL HYSTERECTOMY    . PARTIAL HYSTERECTOMY    . TUBAL LIGATION       Family History  Problem Relation Age of Onset  . Heart attack Mother   . Heart disease Mother        Heart attack  . Hypertension Mother   . Stroke Mother   . Stroke Father   . Stroke Sister   . Stroke Brother      Social History   Socioeconomic History  . Marital status: Divorced    Spouse name: n/a  . Number of children: 2  . Years of education: Not on file  . Highest  education level: Bachelor's degree (e.g., BA, AB, BS)  Occupational History  . Occupation: retired Chartered certified accountant [employer]  Social Needs  . Financial resource strain: Not hard at all  . Food insecurity    Worry: Never true    Inability: Never true  . Transportation needs    Medical: No    Non-medical: No  Tobacco Use  . Smoking status: Never Smoker  . Smokeless tobacco: Never Used  Substance and Sexual Activity  . Alcohol use: No    Alcohol/week: 0.0 standard drinks  . Drug use: No  .  Sexual activity: Never  Lifestyle  . Physical activity    Days per week: 0 days    Minutes per session: 0 min  . Stress: Not at all  Relationships  . Social connections    Talks on phone: More than three times a week    Gets together: More than three times a week    Attends religious service: Never    Active member of club or organization: Yes    Attends meetings of clubs or organizations: More than 4 times per year    Relationship status: Divorced  . Intimate partner violence    Fear of current or ex partner: No    Emotionally abused: No    Physically abused: No    Forced sexual activity: No  Other Topics Concern  . Not on file  Social History Narrative   Her son lives with her.     No Known Allergies   Prior to Admission medications   Medication Sig Start Date End Date Taking? Authorizing Provider  acetaminophen (TYLENOL) 325 MG tablet Take 650 mg by mouth every 6 (six) hours as needed.   Yes [provider]  amLODipine (NORVASC) 5 MG tablet Take 1 tablet (5 mg total) by mouth daily. 02/03/18  Yes Jeffery, Domingo Mend, PA  cetirizine (ZYRTEC) 10 MG tablet Take 10 mg by mouth daily.   Yes [provider]  famotidine (PEPCID) 20 MG tablet TAKE 1 TABLET BY MOUTH TWICE A DAY 02/23/19  Yes Stallings, Zoe A, MD  hydrochlorothiazide (MICROZIDE) 12.5 MG capsule Take 1 capsule (12.5 mg total) by mouth daily. 02/03/18  Yes Jeffery, Domingo Mend, PA  meclizine (ANTIVERT) 12.5 MG tablet Take 1 tablet (12.5 mg total) 3 (three) times daily as needed by mouth for dizziness. 08/05/17  Yes Jeffery, Domingo Mend, PA  Multiple Vitamins-Minerals (MULTIVITAMIN WITH MINERALS) tablet Take 1 tablet by mouth daily.   Yes [provider]  simvastatin (ZOCOR) 40 MG tablet TAKE 1 TABLET BY MOUTH EVERYDAY AT BEDTIME 02/03/18  Yes Harrison Mons, PA     Depression screen [facility] 2/9 03/27/2019 08/31/2018 02/03/2018 08/05/2017 08/05/2017  Decreased Interest 0 0 0 0 0  Down, Depressed, Hopeless 0 0 0 0  0  PHQ - 2 Score 0 0 0 0 0     Fall Risk  03/27/2019 08/31/2018 08/05/2017 08/05/2017 02/03/2017  Falls in the past year? 0 0 No No No  Number falls in past yr: 0 - - - -  Injury with Fall? 0 - - - -  Follow up Falls evaluation completed;Education provided;Falls prevention discussed - - - -      PHYSICAL EXAM: BP (!) 129/91 Comment: taking by patient at home  Ht 5\' 2"  (1.575 m)   Wt 125 lb (56.7 kg)   BMI 22.86 kg/m    Wt Readings from Last 3 Encounters:  03/27/19 125 lb (56.7 kg)  08/31/18 126 lb  12.8 oz (57.5 kg)  02/03/18 125 lb (56.7 kg)     No exam data present    Physical Exam   Education/Counseling provided regarding diet and exercise, prevention of chronic diseases, smoking/tobacco cessation, if applicable, and reviewed "Covered Medicare Preventive Services."

## 2019-05-03 ENCOUNTER — Other Ambulatory Visit: Payer: Self-pay | Admitting: Family Medicine

## 2019-05-03 DIAGNOSIS — I10 Essential (primary) hypertension: Secondary | ICD-10-CM

## 2019-05-03 NOTE — Telephone Encounter (Signed)
Will Dr. Nolon Rod be taking over this rx, last ordered x1 year ago

## 2019-05-18 ENCOUNTER — Other Ambulatory Visit: Payer: Self-pay | Admitting: Family Medicine

## 2019-05-18 DIAGNOSIS — I10 Essential (primary) hypertension: Secondary | ICD-10-CM

## 2019-05-19 ENCOUNTER — Other Ambulatory Visit: Payer: Self-pay | Admitting: Family Medicine

## 2019-05-19 DIAGNOSIS — E7800 Pure hypercholesterolemia, unspecified: Secondary | ICD-10-CM

## 2019-05-19 NOTE — Telephone Encounter (Signed)
Requested medication (s) are due for refill today: Yes  Requested medication (s) are on the active medication list: Yes  Last refill:  02/03/18 by another provider  Future visit scheduled: No  Notes to clinic:  Unable to refill, expired, last refilled by another provider.     Requested Prescriptions  Pending Prescriptions Disp Refills   simvastatin (ZOCOR) 40 MG tablet [Pharmacy Med Name: SIMVASTATIN 40 MG TABLET] 90 tablet 3    Sig: TAKE 1 TABLET BY MOUTH EVERYDAY AT BEDTIME     Cardiovascular:  Antilipid - Statins Failed - 05/19/2019  7:34 AM      Failed - Total Cholesterol in normal range and within 360 days    Cholesterol, Total  Date Value Ref Range Status  08/31/2018 226 (H) 100 - 199 mg/dL Final         Failed - LDL in normal range and within 360 days    LDL Calculated  Date Value Ref Range Status  08/31/2018 117 (H) 0 - 99 mg/dL Final         Passed - HDL in normal range and within 360 days    HDL  Date Value Ref Range Status  08/31/2018 98 >39 mg/dL Final         Passed - Triglycerides in normal range and within 360 days    Triglycerides  Date Value Ref Range Status  08/31/2018 55 0 - 149 mg/dL Final         Passed - Patient is not pregnant      Passed - Valid encounter within last 12 months    Recent Outpatient Visits          1 month ago Medicare annual wellness visit, subsequent   [facility] at [place], [place]   8 months ago Essential hypertension   [facility] at [place], [place]   1 year ago HTN, goal below 140/80   Primary Care at [facility], [place], [place]   1 year ago HTN, goal below 140/80   Primary Care at [facility], [place], [place]   2 years ago Hematuria, unspecified type   Primary Care at [place], [place], [place]

## 2019-05-19 NOTE — Telephone Encounter (Signed)
Forwarding medication refill request to the clinical pool for review. 

## 2019-05-21 NOTE — Telephone Encounter (Signed)
Refilled medication for 30 days, 0 refills. Patient needs an OV for additional refills. 

## 2019-05-22 ENCOUNTER — Telehealth: Payer: Self-pay | Admitting: Family Medicine

## 2019-05-22 NOTE — Telephone Encounter (Signed)
Scheduled

## 2019-05-22 NOTE — Telephone Encounter (Signed)
Pt  called and needs simvastatin (ZOCOR) 40 MG tablet  Refilled ASAP only 2 tablets left . Completely out  Please advise  FR

## 2019-05-22 NOTE — Telephone Encounter (Signed)
LVM to schedule appt for med refills.

## 2019-05-23 ENCOUNTER — Other Ambulatory Visit: Payer: Self-pay

## 2019-05-23 ENCOUNTER — Encounter: Payer: Self-pay | Admitting: Registered Nurse

## 2019-05-23 ENCOUNTER — Telehealth (INDEPENDENT_AMBULATORY_CARE_PROVIDER_SITE_OTHER): Payer: Medicare Other | Admitting: Registered Nurse

## 2019-05-23 DIAGNOSIS — E7800 Pure hypercholesterolemia, unspecified: Secondary | ICD-10-CM

## 2019-05-23 DIAGNOSIS — I10 Essential (primary) hypertension: Secondary | ICD-10-CM | POA: Diagnosis not present

## 2019-05-23 MED ORDER — SIMVASTATIN 40 MG PO TABS: ORAL_TABLET | ORAL | 0 refills | Status: DC

## 2019-05-23 MED ORDER — SIMVASTATIN 40 MG PO TABS: ORAL_TABLET | ORAL | 1 refills | Status: DC

## 2019-05-23 MED ORDER — HYDROCHLOROTHIAZIDE 12.5 MG PO CAPS: ORAL_CAPSULE | ORAL | 1 refills | Status: DC

## 2019-05-23 NOTE — Progress Notes (Signed)
Telemedicine Encounter- SOAP NOTE Established Patient  This telephone encounter was conducted with the patient's (or proxy's) verbal consent via audio telecommunications: yes  Patient was instructed to have this encounter in a suitably private space; and to only have persons present to whom they give permission to participate. In addition, patient identity was confirmed by use of name plus two identifiers (DOB and address).  I discussed the limitations, risks, security and privacy concerns of performing an evaluation and management service by telephone and the availability of in person appointments. I also discussed with the patient that there may be a patient responsible charge related to this service. The patient expressed understanding and agreed to proceed.  I spent a total of 12 minutes talking with the patient or their proxy.  No chief complaint on file.   Subjective   Kristy [name] is a 73 y.o. established patient. Telephone visit today for medication refills  HPI Ms Mcartor is a patient of Dr. Nolon Rod, who is treating her for HTN and HLD. She is taking HCTZ 12.5mg  PO qd, Amlodipine 5mg  PO qd, and simvastatin 40mg  PO qd with good effect. She has been on these medications for multiple years. Reports no changes. Denies any adverse effects or concerns regarding HTN, including muscle aches, discolored urine, headache, LOC, shob, dizziness, chest pain, dependent edema.   Otherwise feels well. No complaints - just sick of the pandemic!  Patient Active Problem List   Diagnosis Date Noted  . Screening for colon cancer 08/31/2018  . Encounter for medication monitoring 08/31/2018  . Dyslipidemia 08/31/2018  . Dizziness 08/31/2018  . Essential hypertension 08/31/2018  . Tinnitus 08/05/2017  . Paresthesia 08/05/2017  . History of irregular heartbeat 08/05/2017  . Allergic rhinitis 05/16/2014  . Sleep pattern disturbance 11/23/2013  . Dyspepsia 05/30/2013  . Hypercholesteremia  04/05/2012    Past Medical History:  Diagnosis Date  . Allergy   . Anemia   . Hyperlipidemia   . Hypertension     Current Outpatient Medications  Medication Sig Dispense Refill  . acetaminophen (TYLENOL) 325 MG tablet Take 650 mg by mouth every 6 (six) hours as needed.    Marland Kitchen amLODipine (NORVASC) 5 MG tablet TAKE 1 TABLET BY MOUTH EVERY DAY 90 tablet 3  . cetirizine (ZYRTEC) 10 MG tablet Take 10 mg by mouth daily.    . famotidine (PEPCID) 20 MG tablet TAKE 1 TABLET BY MOUTH TWICE A DAY 180 tablet 1  . hydrochlorothiazide (MICROZIDE) 12.5 MG capsule TAKE 1 CAPSULE BY MOUTH EVERY DAY 90 capsule 1  . meclizine (ANTIVERT) 12.5 MG tablet Take 1 tablet (12.5 mg total) 3 (three) times daily as needed by mouth for dizziness. 30 tablet 0  . Multiple Vitamins-Minerals (MULTIVITAMIN WITH MINERALS) tablet Take 1 tablet by mouth daily.    . simvastatin (ZOCOR) 40 MG tablet TAKE 1 TABLET BY MOUTH EVERYDAY AT BEDTIME 90 tablet 1   No current facility-administered medications for this visit.     No Known Allergies  Social History   Socioeconomic History  . Marital status: Divorced    Spouse name: n/a  . Number of children: 2  . Years of education: Not on file  . Highest education level: Bachelor's degree (e.g., BA, AB, BS)  Occupational History  . Occupation: retired Chartered certified accountant [employer]  Social Needs  . Financial resource strain: Not hard at all  . Food insecurity    Worry: Never true    Inability: Never true  .  Transportation needs    Medical: No    Non-medical: No  Tobacco Use  . Smoking status: Never Smoker  . Smokeless tobacco: Never Used  Substance and Sexual Activity  . Alcohol use: No    Alcohol/week: 0.0 standard drinks  . Drug use: No  . Sexual activity: Never  Lifestyle  . Physical activity    Days per week: 0 days    Minutes per session: 0 min  . Stress: Not at all  Relationships  . Social connections    Talks on phone: More than three times a week     Gets together: More than three times a week    Attends religious service: Never    Active member of club or organization: Yes    Attends meetings of clubs or organizations: More than 4 times per year    Relationship status: Divorced  . Intimate partner violence    Fear of current or ex partner: No    Emotionally abused: No    Physically abused: No    Forced sexual activity: No  Other Topics Concern  . Not on file  Social History Narrative   Her son lives with her.    Review of Systems  Constitutional: Negative.   HENT: Negative.   Eyes: Negative.   Respiratory: Negative.   Cardiovascular: Negative.   Gastrointestinal: Negative.   Genitourinary: Negative.   Skin: Negative.   Neurological: Negative.   Endo/Heme/Allergies: Negative.   Psychiatric/Behavioral: Negative.   All other systems reviewed and are negative.   Objective   Vitals as reported by the patient: Today's Vitals   05/23/19 1459  BP: 119/82  Temp: 97.9 F (36.6 C)  TempSrc: Oral  Weight: 125 lb 6.4 oz (56.9 kg)  Height: 5\' 2"  (1.575 m)    Diagnoses and all orders for this visit:  HTN (hypertension), benign -     hydrochlorothiazide (MICROZIDE) 12.5 MG capsule; TAKE 1 CAPSULE BY MOUTH EVERY DAY  Hypercholesteremia -     simvastatin (ZOCOR) 40 MG tablet; TAKE 1 TABLET BY MOUTH EVERYDAY AT BEDTIME   PLAN  Per Dr Nolon Rod f/u plan - 6 mos refills provided, next visit to be in office to get full set of vitals and labs if needed  AWV completed in June of this year  Patient encouraged to call clinic with any questions, comments, or concerns.    I discussed the assessment and treatment plan with the patient. The patient was provided an opportunity to ask questions and all were answered. The patient agreed with the plan and demonstrated an understanding of the instructions.   The patient was advised to call back or seek an in-person evaluation if the symptoms worsen or if the condition fails to  improve as anticipated.  I provided 12 minutes of non-face-to-face time during this encounter.  Maximiano Coss, NP  Primary Care at [facility]

## 2019-05-23 NOTE — Progress Notes (Signed)
Med refill for Simvastatin and hydrochlorothiazide.

## 2019-05-23 NOTE — Patient Instructions (Signed)
° ° ° °  If you have lab work done today you will be contacted with your lab results within the next 2 weeks.  If you have not heard from us then please contact us. The fastest way to get your results is to register for My Chart. ° ° °IF you received an x-ray today, you will receive an invoice from [facility]. Please contact [facility] at [phone] with questions or concerns regarding your invoice.  ° °IF you received labwork today, you will receive an invoice from [facility]. Please contact [facility] at [phone] with questions or concerns regarding your invoice.  ° °Our billing staff will not be able to assist you with questions regarding bills from these companies. ° °You will be contacted with the lab results as soon as they are available. The fastest way to get your results is to activate your My Chart account. Instructions are located on the last page of this paperwork. If you have not heard from us regarding the results in 2 weeks, please contact this office. °  ° ° ° °

## 2019-05-23 NOTE — Telephone Encounter (Signed)
Rx was sent pt need follow-up with  Dr. Bridget Hartshorn for labs

## 2019-05-31 NOTE — Telephone Encounter (Signed)
Already had appt w/ Orland Mustard

## 2019-06-28 DIAGNOSIS — Z23 Encounter for immunization: Secondary | ICD-10-CM | POA: Diagnosis not present

## 2019-08-05 ENCOUNTER — Other Ambulatory Visit: Payer: Self-pay | Admitting: Family Medicine

## 2019-08-05 DIAGNOSIS — K219 Gastro-esophageal reflux disease without esophagitis: Secondary | ICD-10-CM

## 2019-08-29 DIAGNOSIS — Z1231 Encounter for screening mammogram for malignant neoplasm of breast: Secondary | ICD-10-CM | POA: Diagnosis not present

## 2019-08-29 LAB — HM MAMMOGRAPHY

## 2019-09-04 ENCOUNTER — Encounter: Payer: Self-pay | Admitting: Family Medicine

## 2019-09-04 LAB — HM MAMMOGRAPHY

## 2019-09-11 ENCOUNTER — Encounter: Payer: Self-pay | Admitting: Family Medicine

## 2019-12-09 ENCOUNTER — Other Ambulatory Visit: Payer: Self-pay | Admitting: Registered Nurse

## 2019-12-09 DIAGNOSIS — I10 Essential (primary) hypertension: Secondary | ICD-10-CM

## 2019-12-09 DIAGNOSIS — E7800 Pure hypercholesterolemia, unspecified: Secondary | ICD-10-CM

## 2019-12-26 ENCOUNTER — Ambulatory Visit (INDEPENDENT_AMBULATORY_CARE_PROVIDER_SITE_OTHER): Payer: Medicare Other | Admitting: Family Medicine

## 2019-12-26 ENCOUNTER — Encounter: Payer: Self-pay | Admitting: Family Medicine

## 2019-12-26 ENCOUNTER — Other Ambulatory Visit: Payer: Self-pay

## 2019-12-26 VITALS — BP 132/64 | HR 83 | Temp 98.2°F | Resp 16 | Ht 62.0 in | Wt 127.0 lb

## 2019-12-26 DIAGNOSIS — K219 Gastro-esophageal reflux disease without esophagitis: Secondary | ICD-10-CM | POA: Diagnosis not present

## 2019-12-26 DIAGNOSIS — R42 Dizziness and giddiness: Secondary | ICD-10-CM | POA: Diagnosis not present

## 2019-12-26 DIAGNOSIS — I10 Essential (primary) hypertension: Secondary | ICD-10-CM

## 2019-12-26 DIAGNOSIS — E7800 Pure hypercholesterolemia, unspecified: Secondary | ICD-10-CM

## 2019-12-26 DIAGNOSIS — R770 Abnormality of albumin: Secondary | ICD-10-CM | POA: Diagnosis not present

## 2019-12-26 MED ORDER — HYDROCHLOROTHIAZIDE 12.5 MG PO CAPS: 12.5000 mg | ORAL_CAPSULE | Freq: Every day | ORAL | 1 refills | Status: AC

## 2019-12-26 MED ORDER — AMLODIPINE BESYLATE 5 MG PO TABS: 5.0000 mg | ORAL_TABLET | Freq: Every day | ORAL | 3 refills | Status: DC

## 2019-12-26 MED ORDER — MECLIZINE HCL 12.5 MG PO TABS: 12.5000 mg | ORAL_TABLET | Freq: Three times a day (TID) | ORAL | 0 refills | Status: AC | PRN

## 2019-12-26 MED ORDER — SIMVASTATIN 40 MG PO TABS: 40.0000 mg | ORAL_TABLET | Freq: Every day | ORAL | 1 refills | Status: DC

## 2019-12-26 MED ORDER — FAMOTIDINE 20 MG PO TABS: 20.0000 mg | ORAL_TABLET | Freq: Two times a day (BID) | ORAL | 1 refills | Status: AC

## 2019-12-26 NOTE — Patient Instructions (Signed)
° ° ° °  If you have lab work done today you will be contacted with your lab results within the next 2 weeks.  If you have not heard from us then please contact us. The fastest way to get your results is to register for My Chart. ° ° °IF you received an x-ray today, you will receive an invoice from [facility]. Please contact [facility] at [phone] with questions or concerns regarding your invoice.  ° °IF you received labwork today, you will receive an invoice from [facility]. Please contact [facility] at [phone] with questions or concerns regarding your invoice.  ° °Our billing staff will not be able to assist you with questions regarding bills from these companies. ° °You will be contacted with the lab results as soon as they are available. The fastest way to get your results is to activate your My Chart account. Instructions are located on the last page of this paperwork. If you have not heard from us regarding the results in 2 weeks, please contact this office. °  ° ° ° °

## 2019-12-26 NOTE — Progress Notes (Signed)
Established Patient Office Visit  Subjective:  Patient ID: Kristy [name], female    DOB: 1945/10/13  Age: 74 y.o. MRN: [id number]  CC:  Chief Complaint  Patient presents with  . Hypertension    check lab work     HPI Kristy [name] presents for   Hypertension: Patient here for follow-up of elevated blood pressure. She is exercising and is adherent to low salt diet.  Blood pressure is well controlled at home. Cardiac symptoms none. Patient denies chest pain, chest pressure/discomfort, exertional chest pressure/discomfort, irregular heart beat and lower extremity edema.  Cardiovascular risk factors: advanced age (older than 71 for men, 35 for women), dyslipidemia and hypertension. Use of agents associated with hypertension: none. History of target organ damage: none.   Medication refills  She needs refills today She is compliant with her medications  Dyslipidemia: Patient presents for evaluation of lipids.  Compliance with treatment thus far has been excellent.  A repeat fasting lipid profile was not done.  The patient does not use medications that may worsen dyslipidemias (corticosteroids, progestins, anabolic steroids, diuretics, beta-blockers, amiodarone, cyclosporine, olanzapine). The patient exercises intermittently.  The patient is not known to have coexisting coronary artery disease.   The 10-year ASCVD risk score Mikey Bussing DC Brooke Bonito., et al., 2013) is: 21.3%   Values used to calculate the score:     Age: 4 years     Sex: Female     Is Non-Hispanic African American: Yes     Diabetic: No     Tobacco smoker: No     Systolic Blood Pressure: 829 mmHg     Is BP treated: Yes     HDL Cholesterol: 98 mg/dL     Total Cholesterol: 226 mg/dL     Past Medical History:  Diagnosis Date  . Allergy   . Anemia   . Hyperlipidemia   . Hypertension     Past Surgical History:  Procedure Laterality Date  . ABDOMINAL HYSTERECTOMY    . PARTIAL HYSTERECTOMY    . TUBAL LIGATION       Family History  Problem Relation Age of Onset  . Heart attack Mother   . Heart disease Mother        Heart attack  . Hypertension Mother   . Stroke Mother   . Stroke Father   . Stroke Sister   . Stroke Brother     Social History   Socioeconomic History  . Marital status: Divorced    Spouse name: n/a  . Number of children: 2  . Years of education: Not on file  . Highest education level: Bachelor's degree (e.g., BA, AB, BS)  Occupational History  . Occupation: retired Chartered certified accountant [employer]  Tobacco Use  . Smoking status: Never Smoker  . Smokeless tobacco: Never Used  Substance and Sexual Activity  . Alcohol use: No    Alcohol/week: 0.0 standard drinks  . Drug use: No  . Sexual activity: Never  Other Topics Concern  . Not on file  Social History Narrative   Her son lives with her.   Social Determinants of Health   Financial Resource Strain:   . Difficulty of Paying Living Expenses:   Food Insecurity:   . Worried About Charity fundraiser in the Last Year:   . Arboriculturist in the Last Year:   Transportation Needs:   . Film/video editor (Medical):   Marland Kitchen Lack of Transportation (Non-Medical):   Physical Activity:   .  Days of Exercise per Week:   . Minutes of Exercise per Session:   Stress:   . Feeling of Stress :   Social Connections:   . Frequency of Communication with Friends and Family:   . Frequency of Social Gatherings with Friends and Family:   . Attends Religious Services:   . Active Member of Clubs or Organizations:   . Attends Archivist Meetings:   Marland Kitchen Marital Status:   Intimate Partner Violence:   . Fear of Current or Ex-Partner:   . Emotionally Abused:   Marland Kitchen Physically Abused:   . Sexually Abused:     Outpatient Medications Prior to Visit  Medication Sig Dispense Refill  . acetaminophen (TYLENOL) 325 MG tablet Take 650 mg by mouth every 6 (six) hours as needed.    Marland Kitchen amLODipine (NORVASC) 5 MG tablet TAKE 1 TABLET BY  MOUTH EVERY DAY 90 tablet 3  . cetirizine (ZYRTEC) 10 MG tablet Take 10 mg by mouth daily.    . famotidine (PEPCID) 20 MG tablet TAKE 1 TABLET BY MOUTH TWICE A DAY 180 tablet 1  . hydrochlorothiazide (MICROZIDE) 12.5 MG capsule TAKE 1 CAPSULE BY MOUTH EVERY DAY 90 capsule 1  . meclizine (ANTIVERT) 12.5 MG tablet Take 1 tablet (12.5 mg total) 3 (three) times daily as needed by mouth for dizziness. 30 tablet 0  . Multiple Vitamins-Minerals (MULTIVITAMIN WITH MINERALS) tablet Take 1 tablet by mouth daily.    . simvastatin (ZOCOR) 40 MG tablet TAKE 1 TABLET BY MOUTH EVERYDAY AT BEDTIME 90 tablet 0   No facility-administered medications prior to visit.    No Known Allergies  ROS Review of Systems Review of Systems  Constitutional: Negative for activity change, appetite change, chills and fever.  HENT: Negative for congestion, nosebleeds, trouble swallowing and voice change.   Respiratory: Negative for cough, shortness of breath and wheezing.   Gastrointestinal: Negative for diarrhea, nausea and vomiting.  Genitourinary: Negative for difficulty urinating, dysuria, flank pain and hematuria.  Musculoskeletal: Negative for back pain, joint swelling and neck pain.  Neurological: Negative for dizziness, speech difficulty, light-headedness and numbness.  See HPI. All other review of systems negative.     Objective:    Physical Exam  BP (!) 156/79   Pulse 83   Temp 98.2 F (36.8 C) (Temporal)   Resp 16   Ht '5\' 2"'  (1.575 m) Comment: per pt  Wt 127 lb (57.6 kg)   SpO2 98%   BMI 23.23 kg/m  Wt Readings from Last 3 Encounters:  12/26/19 127 lb (57.6 kg)  05/23/19 125 lb 6.4 oz (56.9 kg)  03/27/19 125 lb (56.7 kg)   Physical Exam  Constitutional: Oriented to person, place, and time. Appears well-developed and well-nourished.  HENT:  Head: Normocephalic and atraumatic.  Eyes: Conjunctivae and EOM are normal.  Cardiovascular: Normal rate, regular rhythm, normal heart sounds and intact  distal pulses.  No murmur heard. Pulmonary/Chest: Effort normal and breath sounds normal. No stridor. No respiratory distress. Has no wheezes.  Neurological: Is alert and oriented to person, place, and time.  Skin: Skin is warm. Capillary refill takes less than 2 seconds.  Psychiatric: Has a normal mood and affect. Behavior is normal. Judgment and thought content normal.    There are no preventive care reminders to display for this patient.  There are no preventive care reminders to display for this patient.  Lab Results  Component Value Date   TSH 1.550 08/05/2017   Lab Results  Component Value  Date   WBC 4.3 02/03/2018   HGB 12.8 02/03/2018   HCT 38.8 02/03/2018   MCV 90 02/03/2018   PLT 254 02/03/2018   Lab Results  Component Value Date   NA 143 08/31/2018   K 4.4 08/31/2018   CO2 23 08/31/2018   GLUCOSE 97 08/31/2018   BUN 10 08/31/2018   CREATININE 0.85 08/31/2018   BILITOT 0.9 08/31/2018   ALKPHOS 79 08/31/2018   AST 22 08/31/2018   ALT 21 08/31/2018   PROT 8.2 08/31/2018   ALBUMIN 5.1 (H) 08/31/2018   CALCIUM 10.5 (H) 08/31/2018   Lab Results  Component Value Date   CHOL 226 (H) 08/31/2018   Lab Results  Component Value Date   HDL 98 08/31/2018   Lab Results  Component Value Date   LDLCALC 117 (H) 08/31/2018   Lab Results  Component Value Date   TRIG 55 08/31/2018   Lab Results  Component Value Date   CHOLHDL 2.3 08/31/2018   No results found for: HGBA1C    Assessment & Plan:   Problem List Items Addressed This Visit      Other   Hypercholesteremia  - Discussed medications that affect lipids Reminded patient to avoid grapefruits Reviewed last 3 lipids Discussed current meds: statin, aspirin Advised dietary fiber and fish oil and ways to keep HDL high CAD prevention and reviewed side effects of statins    Relevant Orders   Lipid panel   CMP14+EGFR    Other Visit Diagnoses    HTN (hypertension), benign    -  Primary Patient's blood  pressure is at goal of 139/89 or less. Condition is stable. Continue current medications and treatment plan. I recommend that you exercise for 30-45 minutes 5 days a week. I also recommend a balanced diet with fruits and vegetables every day, lean meats, and little fried foods. The DASH diet (you can find this online) is a good example of this.    Relevant Orders   Lipid panel   CMP14+EGFR   Abnormal albumin       Relevant Orders   Microalbumin, urine      No orders of the defined types were placed in this encounter.   Follow-up: No follow-ups on file.    Forrest Moron, MD

## 2019-12-27 LAB — CMP14+EGFR
ALT: 18 IU/L (ref 0–32)
AST: 24 IU/L (ref 0–40)
Albumin/Globulin Ratio: 1.7 (ref 1.2–2.2)
Albumin: 5 g/dL — ABNORMAL HIGH (ref 3.7–4.7)
Alkaline Phosphatase: 86 IU/L (ref 39–117)
BUN/Creatinine Ratio: 10 — ABNORMAL LOW (ref 12–28)
BUN: 7 mg/dL — ABNORMAL LOW (ref 8–27)
Bilirubin Total: 0.6 mg/dL (ref 0.0–1.2)
CO2: 24 mmol/L (ref 20–29)
Calcium: 10.1 mg/dL (ref 8.7–10.3)
Chloride: 102 mmol/L (ref 96–106)
Creatinine, Ser: 0.72 mg/dL (ref 0.57–1.00)
GFR calc Af Amer: 97 mL/min/{1.73_m2} (ref >59)
GFR calc non Af Amer: 85 mL/min/{1.73_m2} (ref >59)
Globulin, Total: 2.9 g/dL (ref 1.5–4.5)
Glucose: 96 mg/dL (ref 65–99)
Potassium: 3.4 mmol/L — ABNORMAL LOW (ref 3.5–5.2)
Sodium: 140 mmol/L (ref 134–144)
Total Protein: 7.9 g/dL (ref 6.0–8.5)

## 2019-12-27 LAB — LIPID PANEL
Chol/HDL Ratio: 2.3 ratio (ref 0.0–4.4)
Cholesterol, Total: 197 mg/dL (ref 100–199)
HDL: 86 mg/dL (ref >39)
LDL Chol Calc (NIH): 101 mg/dL — ABNORMAL HIGH (ref 0–99)
Triglycerides: 53 mg/dL (ref 0–149)
VLDL Cholesterol Cal: 10 mg/dL (ref 5–40)

## 2019-12-27 LAB — MICROALBUMIN, URINE: Microalbumin, Urine: <3 ug/mL

## 2020-01-02 ENCOUNTER — Encounter: Payer: Self-pay | Admitting: Radiology

## 2020-05-01 DIAGNOSIS — M25511 Pain in right shoulder: Secondary | ICD-10-CM | POA: Diagnosis not present

## 2020-05-01 DIAGNOSIS — I10 Essential (primary) hypertension: Secondary | ICD-10-CM | POA: Diagnosis not present

## 2020-05-12 ENCOUNTER — Other Ambulatory Visit: Payer: Self-pay | Admitting: Family Medicine

## 2020-05-12 ENCOUNTER — Ambulatory Visit
Admission: RE | Admit: 2020-05-12 | Discharge: 2020-05-12 | Disposition: A | Discharge status: Home / Self Care | Payer: Medicare Other | Source: Ambulatory Visit | Attending: Family Medicine | Admitting: Family Medicine

## 2020-05-12 DIAGNOSIS — M25511 Pain in right shoulder: Secondary | ICD-10-CM

## 2020-05-12 DIAGNOSIS — G8929 Other chronic pain: Secondary | ICD-10-CM | POA: Diagnosis not present

## 2020-05-15 DIAGNOSIS — M25611 Stiffness of right shoulder, not elsewhere classified: Secondary | ICD-10-CM | POA: Diagnosis not present

## 2020-05-15 DIAGNOSIS — M25511 Pain in right shoulder: Secondary | ICD-10-CM | POA: Diagnosis not present

## 2020-05-23 DIAGNOSIS — M25611 Stiffness of right shoulder, not elsewhere classified: Secondary | ICD-10-CM | POA: Diagnosis not present

## 2020-05-23 DIAGNOSIS — M25511 Pain in right shoulder: Secondary | ICD-10-CM | POA: Diagnosis not present

## 2020-05-29 DIAGNOSIS — M25611 Stiffness of right shoulder, not elsewhere classified: Secondary | ICD-10-CM | POA: Diagnosis not present

## 2020-05-29 DIAGNOSIS — M25511 Pain in right shoulder: Secondary | ICD-10-CM | POA: Diagnosis not present

## 2020-06-05 DIAGNOSIS — M25611 Stiffness of right shoulder, not elsewhere classified: Secondary | ICD-10-CM | POA: Diagnosis not present

## 2020-06-05 DIAGNOSIS — M25511 Pain in right shoulder: Secondary | ICD-10-CM | POA: Diagnosis not present

## 2020-06-12 DIAGNOSIS — M25611 Stiffness of right shoulder, not elsewhere classified: Secondary | ICD-10-CM | POA: Diagnosis not present

## 2020-06-12 DIAGNOSIS — M25511 Pain in right shoulder: Secondary | ICD-10-CM | POA: Diagnosis not present

## 2020-06-19 DIAGNOSIS — M25611 Stiffness of right shoulder, not elsewhere classified: Secondary | ICD-10-CM | POA: Diagnosis not present

## 2020-06-19 DIAGNOSIS — M25511 Pain in right shoulder: Secondary | ICD-10-CM | POA: Diagnosis not present

## 2020-07-10 DIAGNOSIS — Z0000 Encounter for general adult medical examination without abnormal findings: Secondary | ICD-10-CM | POA: Diagnosis not present

## 2020-07-10 DIAGNOSIS — I10 Essential (primary) hypertension: Secondary | ICD-10-CM | POA: Diagnosis not present

## 2020-07-10 DIAGNOSIS — Z79899 Other long term (current) drug therapy: Secondary | ICD-10-CM | POA: Diagnosis not present

## 2020-07-21 DIAGNOSIS — I10 Essential (primary) hypertension: Secondary | ICD-10-CM | POA: Diagnosis not present

## 2020-08-01 DIAGNOSIS — Z23 Encounter for immunization: Secondary | ICD-10-CM | POA: Diagnosis not present

## 2020-09-16 DIAGNOSIS — Z1231 Encounter for screening mammogram for malignant neoplasm of breast: Secondary | ICD-10-CM | POA: Diagnosis not present

## 2021-01-14 DIAGNOSIS — Z23 Encounter for immunization: Secondary | ICD-10-CM | POA: Diagnosis not present

## 2021-06-09 DIAGNOSIS — Z23 Encounter for immunization: Secondary | ICD-10-CM | POA: Diagnosis not present

## 2021-06-24 DIAGNOSIS — Z23 Encounter for immunization: Secondary | ICD-10-CM | POA: Diagnosis not present

## 2021-07-13 DIAGNOSIS — K219 Gastro-esophageal reflux disease without esophagitis: Secondary | ICD-10-CM | POA: Diagnosis not present

## 2021-07-13 DIAGNOSIS — R42 Dizziness and giddiness: Secondary | ICD-10-CM | POA: Diagnosis not present

## 2021-07-13 DIAGNOSIS — I10 Essential (primary) hypertension: Secondary | ICD-10-CM | POA: Diagnosis not present

## 2021-07-13 DIAGNOSIS — G8929 Other chronic pain: Secondary | ICD-10-CM | POA: Diagnosis not present

## 2021-07-13 DIAGNOSIS — E7800 Pure hypercholesterolemia, unspecified: Secondary | ICD-10-CM | POA: Diagnosis not present

## 2021-07-13 DIAGNOSIS — Z5181 Encounter for therapeutic drug level monitoring: Secondary | ICD-10-CM | POA: Diagnosis not present

## 2021-07-13 DIAGNOSIS — Z86010 Personal history of colonic polyps: Secondary | ICD-10-CM | POA: Diagnosis not present

## 2021-07-13 DIAGNOSIS — M25511 Pain in right shoulder: Secondary | ICD-10-CM | POA: Diagnosis not present

## 2021-07-23 DIAGNOSIS — D126 Benign neoplasm of colon, unspecified: Secondary | ICD-10-CM | POA: Diagnosis not present

## 2021-08-05 DIAGNOSIS — M25511 Pain in right shoulder: Secondary | ICD-10-CM | POA: Diagnosis not present

## 2021-08-05 DIAGNOSIS — Z1389 Encounter for screening for other disorder: Secondary | ICD-10-CM | POA: Diagnosis not present

## 2021-08-05 DIAGNOSIS — M8589 Other specified disorders of bone density and structure, multiple sites: Secondary | ICD-10-CM | POA: Diagnosis not present

## 2021-08-05 DIAGNOSIS — I10 Essential (primary) hypertension: Secondary | ICD-10-CM | POA: Diagnosis not present

## 2021-08-05 DIAGNOSIS — Z0000 Encounter for general adult medical examination without abnormal findings: Secondary | ICD-10-CM | POA: Diagnosis not present

## 2021-09-25 DIAGNOSIS — Z1231 Encounter for screening mammogram for malignant neoplasm of breast: Secondary | ICD-10-CM | POA: Diagnosis not present

## 2021-09-25 DIAGNOSIS — M8589 Other specified disorders of bone density and structure, multiple sites: Secondary | ICD-10-CM | POA: Diagnosis not present

## 2021-09-25 DIAGNOSIS — Z780 Asymptomatic menopausal state: Secondary | ICD-10-CM | POA: Diagnosis not present

## 2021-09-30 ENCOUNTER — Other Ambulatory Visit: Payer: Self-pay | Admitting: Internal Medicine

## 2021-09-30 ENCOUNTER — Ambulatory Visit
Admission: RE | Admit: 2021-09-30 | Discharge: 2021-09-30 | Disposition: A | Discharge status: Home / Self Care | Payer: Medicare Other | Source: Ambulatory Visit | Attending: Internal Medicine | Admitting: Internal Medicine

## 2022-02-02 DIAGNOSIS — M25512 Pain in left shoulder: Secondary | ICD-10-CM | POA: Diagnosis not present

## 2022-02-02 DIAGNOSIS — R42 Dizziness and giddiness: Secondary | ICD-10-CM | POA: Diagnosis not present

## 2022-02-02 DIAGNOSIS — K219 Gastro-esophageal reflux disease without esophagitis: Secondary | ICD-10-CM | POA: Diagnosis not present

## 2022-02-02 DIAGNOSIS — I10 Essential (primary) hypertension: Secondary | ICD-10-CM | POA: Diagnosis not present

## 2022-02-02 DIAGNOSIS — M7989 Other specified soft tissue disorders: Secondary | ICD-10-CM | POA: Diagnosis not present

## 2022-02-02 DIAGNOSIS — E7800 Pure hypercholesterolemia, unspecified: Secondary | ICD-10-CM | POA: Diagnosis not present

## 2022-02-02 DIAGNOSIS — M8589 Other specified disorders of bone density and structure, multiple sites: Secondary | ICD-10-CM | POA: Diagnosis not present

## 2022-02-13 IMAGING — CR DG CHEST 2V
2 series · 2 of 2 positions shown · non-contrast
Comparison: 05/30/2013

CLINICAL DATA: 75-year-old female with hypercalcemia

EXAM:
CHEST - 2 VIEW

[w chest pa]
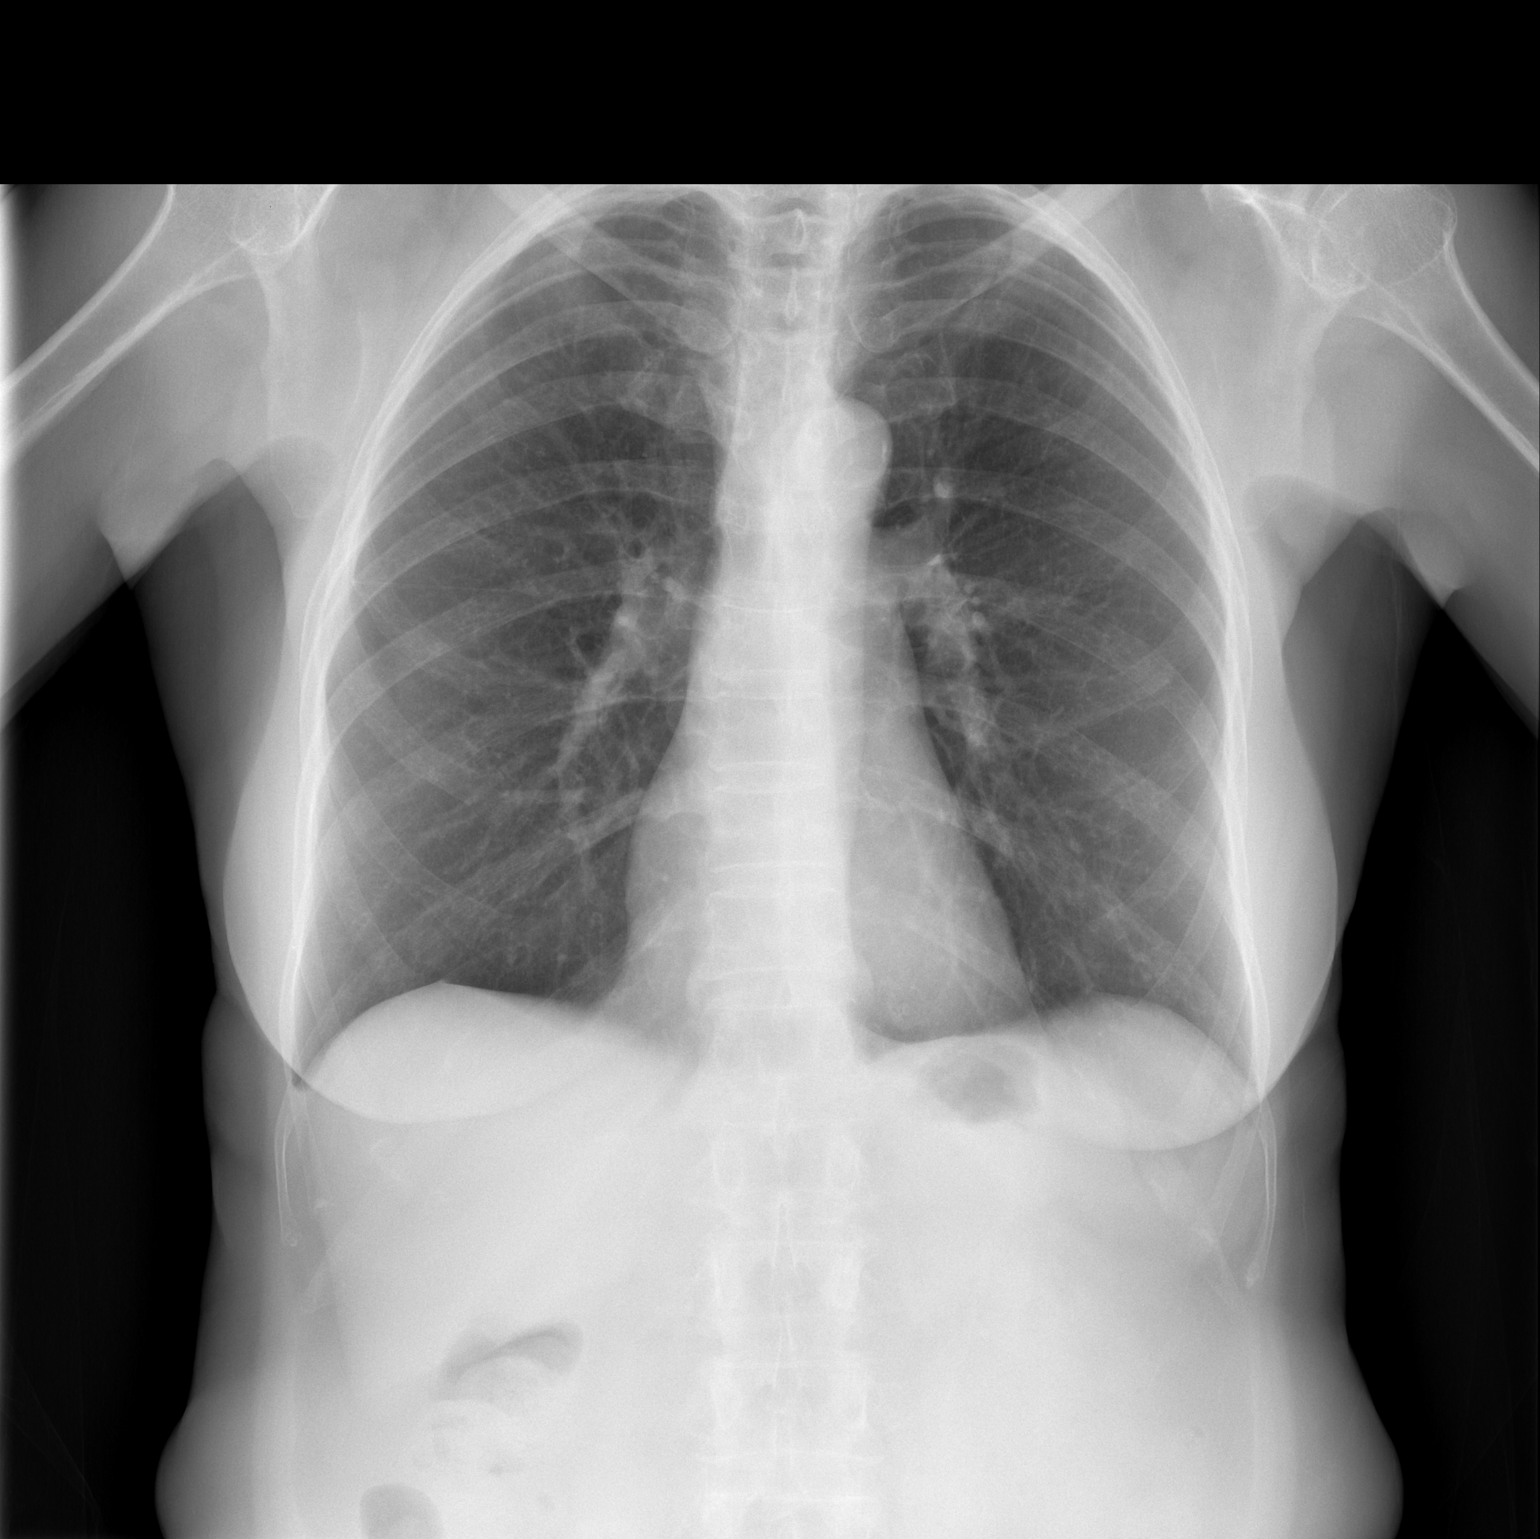

[w chest lat]
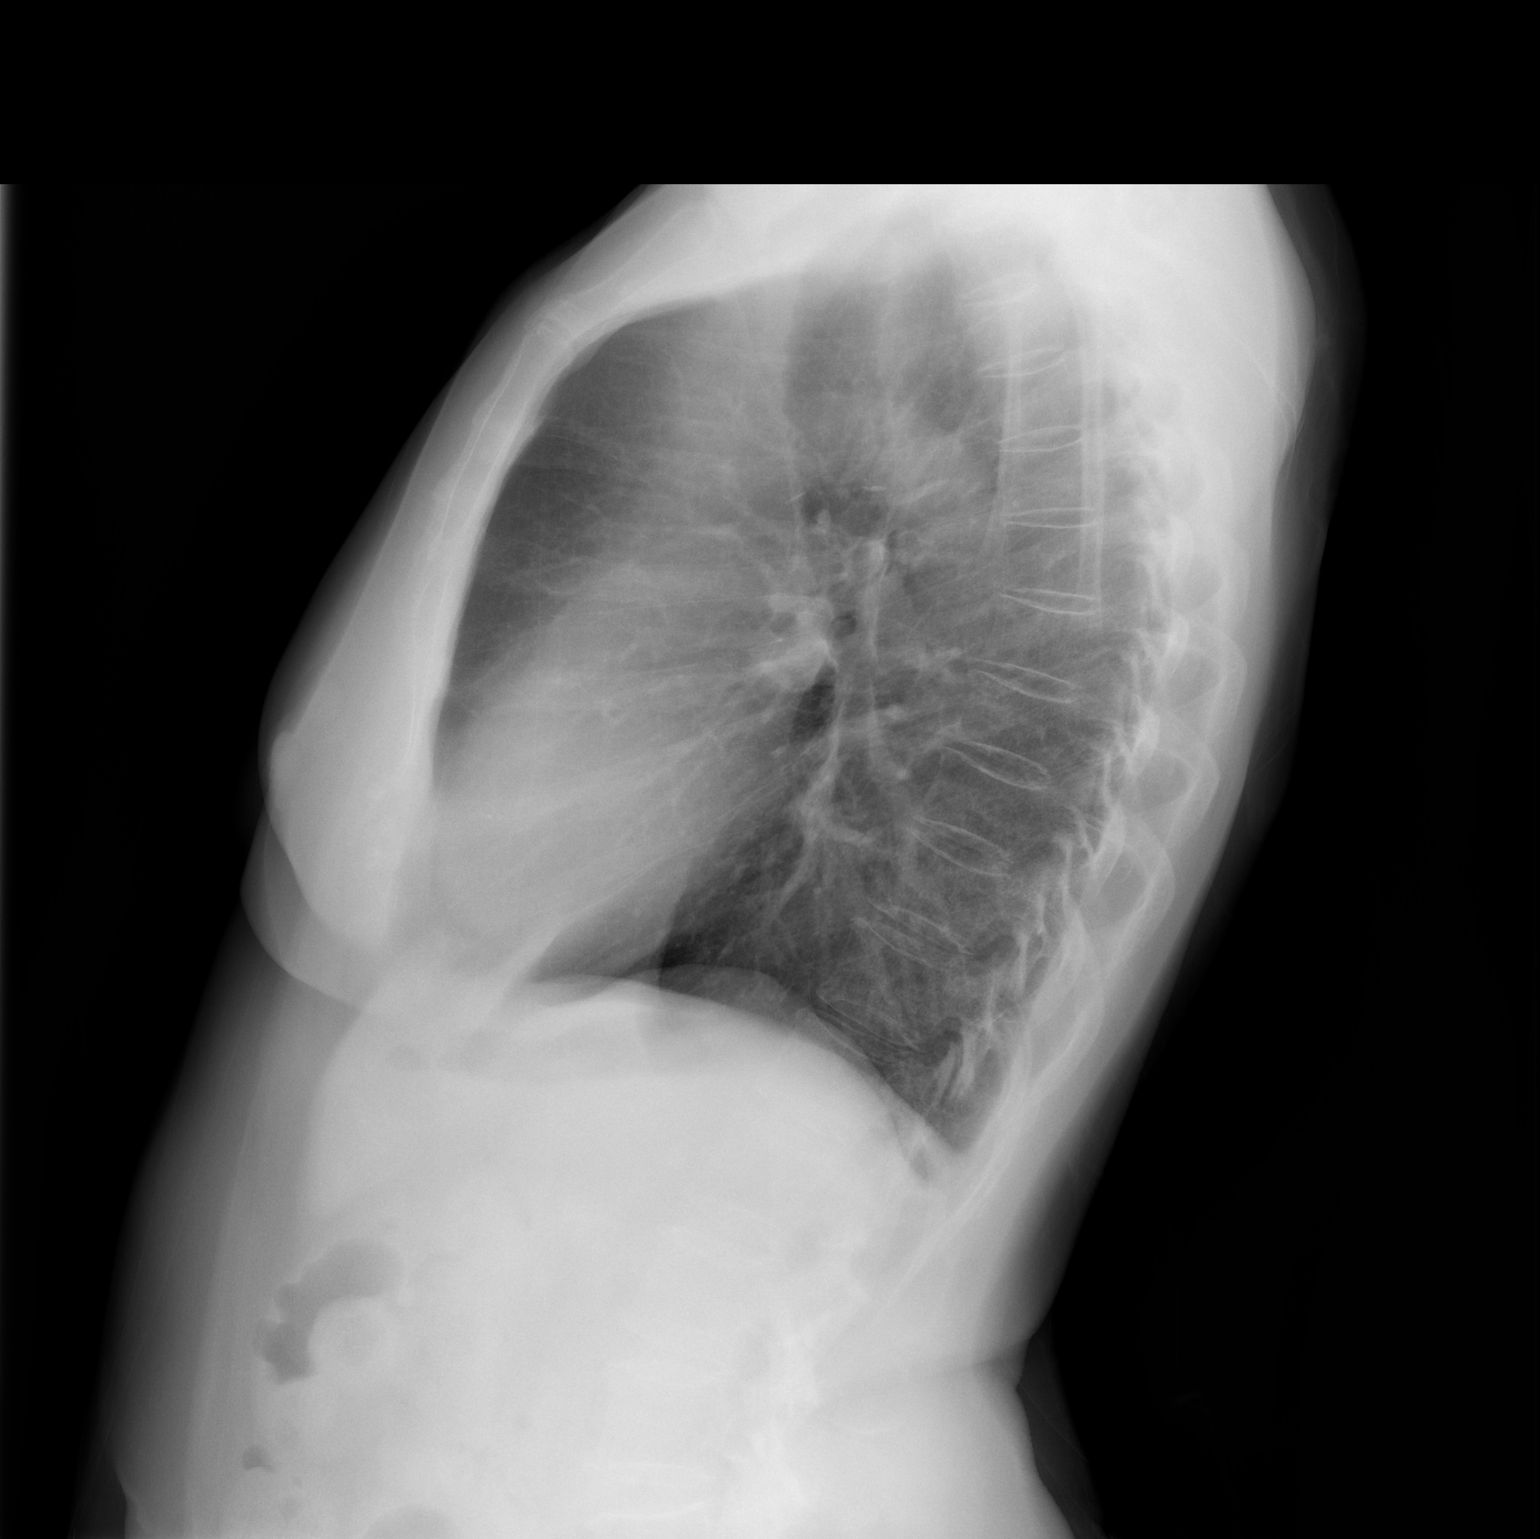

[2 of 2 positions shown; findings below may reference images not displayed]

FINDINGS: Cardiomediastinal silhouette unchanged in size and contour. No
evidence of central vascular congestion. No interlobular septal
thickening.

No pneumothorax or pleural effusion. Coarsened interstitial
markings, with no confluent airspace disease.

No acute displaced fracture. Degenerative changes of the spine.
IMPRESSION: No active cardiopulmonary disease.

## 2022-06-21 DIAGNOSIS — Z23 Encounter for immunization: Secondary | ICD-10-CM | POA: Diagnosis not present

## 2022-06-25 DIAGNOSIS — Z23 Encounter for immunization: Secondary | ICD-10-CM | POA: Diagnosis not present

## 2022-08-06 DIAGNOSIS — Z79899 Other long term (current) drug therapy: Secondary | ICD-10-CM | POA: Diagnosis not present

## 2022-08-06 DIAGNOSIS — Z0000 Encounter for general adult medical examination without abnormal findings: Secondary | ICD-10-CM | POA: Diagnosis not present

## 2022-08-06 DIAGNOSIS — K219 Gastro-esophageal reflux disease without esophagitis: Secondary | ICD-10-CM | POA: Diagnosis not present

## 2022-08-06 DIAGNOSIS — E559 Vitamin D deficiency, unspecified: Secondary | ICD-10-CM | POA: Diagnosis not present

## 2022-08-06 DIAGNOSIS — M7989 Other specified soft tissue disorders: Secondary | ICD-10-CM | POA: Diagnosis not present

## 2022-08-06 DIAGNOSIS — I10 Essential (primary) hypertension: Secondary | ICD-10-CM | POA: Diagnosis not present

## 2022-08-06 DIAGNOSIS — Z1331 Encounter for screening for depression: Secondary | ICD-10-CM | POA: Diagnosis not present

## 2022-08-06 DIAGNOSIS — Z1211 Encounter for screening for malignant neoplasm of colon: Secondary | ICD-10-CM | POA: Diagnosis not present

## 2022-08-06 DIAGNOSIS — E7800 Pure hypercholesterolemia, unspecified: Secondary | ICD-10-CM | POA: Diagnosis not present

## 2022-08-06 DIAGNOSIS — M8589 Other specified disorders of bone density and structure, multiple sites: Secondary | ICD-10-CM | POA: Diagnosis not present

## 2022-08-06 DIAGNOSIS — R42 Dizziness and giddiness: Secondary | ICD-10-CM | POA: Diagnosis not present

## 2022-08-19 DIAGNOSIS — Z1211 Encounter for screening for malignant neoplasm of colon: Secondary | ICD-10-CM | POA: Diagnosis not present

## 2022-08-19 DIAGNOSIS — Z1212 Encounter for screening for malignant neoplasm of rectum: Secondary | ICD-10-CM | POA: Diagnosis not present

## 2022-08-27 LAB — EXTERNAL GENERIC LAB PROCEDURE: COLOGUARD: NEGATIVE

## 2022-08-27 LAB — COLOGUARD: COLOGUARD: NEGATIVE

## 2022-09-27 DIAGNOSIS — Z1231 Encounter for screening mammogram for malignant neoplasm of breast: Secondary | ICD-10-CM | POA: Diagnosis not present

## 2023-02-04 DIAGNOSIS — E7800 Pure hypercholesterolemia, unspecified: Secondary | ICD-10-CM | POA: Diagnosis not present

## 2023-02-04 DIAGNOSIS — K219 Gastro-esophageal reflux disease without esophagitis: Secondary | ICD-10-CM | POA: Diagnosis not present

## 2023-02-04 DIAGNOSIS — H43391 Other vitreous opacities, right eye: Secondary | ICD-10-CM | POA: Diagnosis not present

## 2023-02-04 DIAGNOSIS — R252 Cramp and spasm: Secondary | ICD-10-CM | POA: Diagnosis not present

## 2023-02-04 DIAGNOSIS — I10 Essential (primary) hypertension: Secondary | ICD-10-CM | POA: Diagnosis not present

## 2023-06-30 DIAGNOSIS — Z23 Encounter for immunization: Secondary | ICD-10-CM | POA: Diagnosis not present

## 2023-08-11 DIAGNOSIS — R42 Dizziness and giddiness: Secondary | ICD-10-CM | POA: Diagnosis not present

## 2023-08-11 DIAGNOSIS — E7800 Pure hypercholesterolemia, unspecified: Secondary | ICD-10-CM | POA: Diagnosis not present

## 2023-08-11 DIAGNOSIS — I10 Essential (primary) hypertension: Secondary | ICD-10-CM | POA: Diagnosis not present

## 2023-08-11 DIAGNOSIS — E559 Vitamin D deficiency, unspecified: Secondary | ICD-10-CM | POA: Diagnosis not present

## 2023-08-11 DIAGNOSIS — K219 Gastro-esophageal reflux disease without esophagitis: Secondary | ICD-10-CM | POA: Diagnosis not present

## 2023-08-11 DIAGNOSIS — Z0000 Encounter for general adult medical examination without abnormal findings: Secondary | ICD-10-CM | POA: Diagnosis not present

## 2023-08-11 DIAGNOSIS — M7989 Other specified soft tissue disorders: Secondary | ICD-10-CM | POA: Diagnosis not present

## 2023-08-11 DIAGNOSIS — Z79899 Other long term (current) drug therapy: Secondary | ICD-10-CM | POA: Diagnosis not present

## 2023-08-11 DIAGNOSIS — M8589 Other specified disorders of bone density and structure, multiple sites: Secondary | ICD-10-CM | POA: Diagnosis not present

## 2023-10-02 ENCOUNTER — Emergency Department (HOSPITAL_COMMUNITY): Payer: Medicare Other

## 2023-10-02 ENCOUNTER — Inpatient Hospital Stay (HOSPITAL_COMMUNITY)
Admission: EM | Admit: 2023-10-02 | Discharge: 2023-10-04 | DRG: 041 | Disposition: A | Discharge status: Home / Self Care | Payer: Medicare Other | Attending: Student in an Organized Health Care Education/Training Program | Admitting: Student in an Organized Health Care Education/Training Program

## 2023-10-02 DIAGNOSIS — Z823 Family history of stroke: Secondary | ICD-10-CM | POA: Diagnosis not present

## 2023-10-02 DIAGNOSIS — Z8249 Family history of ischemic heart disease and other diseases of the circulatory system: Secondary | ICD-10-CM

## 2023-10-02 DIAGNOSIS — E876 Hypokalemia: Secondary | ICD-10-CM | POA: Diagnosis present

## 2023-10-02 DIAGNOSIS — I10 Essential (primary) hypertension: Secondary | ICD-10-CM | POA: Diagnosis present

## 2023-10-02 DIAGNOSIS — R000 Tachycardia, unspecified: Secondary | ICD-10-CM | POA: Diagnosis not present

## 2023-10-02 DIAGNOSIS — I639 Cerebral infarction, unspecified: Principal | ICD-10-CM | POA: Diagnosis present

## 2023-10-02 DIAGNOSIS — Z79899 Other long term (current) drug therapy: Secondary | ICD-10-CM | POA: Diagnosis not present

## 2023-10-02 DIAGNOSIS — I700 Atherosclerosis of aorta: Secondary | ICD-10-CM | POA: Diagnosis not present

## 2023-10-02 DIAGNOSIS — Z90711 Acquired absence of uterus with remaining cervical stump: Secondary | ICD-10-CM

## 2023-10-02 DIAGNOSIS — R930 Abnormal findings on diagnostic imaging of skull and head, not elsewhere classified: Secondary | ICD-10-CM | POA: Diagnosis not present

## 2023-10-02 DIAGNOSIS — I6502 Occlusion and stenosis of left vertebral artery: Secondary | ICD-10-CM | POA: Diagnosis not present

## 2023-10-02 DIAGNOSIS — R29703 NIHSS score 3: Secondary | ICD-10-CM | POA: Diagnosis present

## 2023-10-02 DIAGNOSIS — I6389 Other cerebral infarction: Secondary | ICD-10-CM | POA: Diagnosis not present

## 2023-10-02 DIAGNOSIS — E785 Hyperlipidemia, unspecified: Secondary | ICD-10-CM | POA: Diagnosis present

## 2023-10-02 DIAGNOSIS — R531 Weakness: Secondary | ICD-10-CM | POA: Diagnosis not present

## 2023-10-02 DIAGNOSIS — G8194 Hemiplegia, unspecified affecting left nondominant side: Secondary | ICD-10-CM | POA: Diagnosis not present

## 2023-10-02 DIAGNOSIS — I6523 Occlusion and stenosis of bilateral carotid arteries: Secondary | ICD-10-CM | POA: Diagnosis not present

## 2023-10-02 DIAGNOSIS — I6622 Occlusion and stenosis of left posterior cerebral artery: Secondary | ICD-10-CM | POA: Diagnosis not present

## 2023-10-02 HISTORY — DX: Cerebral infarction, unspecified: I63.9

## 2023-10-02 LAB — I-STAT CHEM 8, ED
BUN: 8 mg/dL (ref 8–23)
Calcium, Ion: 1.18 mmol/L (ref 1.15–1.40)
Chloride: 108 mmol/L (ref 98–111)
Creatinine, Ser: 0.8 mg/dL (ref 0.44–1.00)
Glucose, Bld: 139 mg/dL — ABNORMAL HIGH (ref 70–99)
HCT: 41 % (ref 36.0–46.0)
Hemoglobin: 13.9 g/dL (ref 12.0–15.0)
Potassium: 2.9 mmol/L — ABNORMAL LOW (ref 3.5–5.1)
Sodium: 142 mmol/L (ref 135–145)
TCO2: 21 mmol/L — ABNORMAL LOW (ref 22–32)

## 2023-10-02 LAB — COMPREHENSIVE METABOLIC PANEL
ALT: 18 U/L (ref 0–44)
AST: 24 U/L (ref 15–41)
Albumin: 4.2 g/dL (ref 3.5–5.0)
Alkaline Phosphatase: 83 U/L (ref 38–126)
Anion gap: 13 (ref 5–15)
BUN: 9 mg/dL (ref 8–23)
CO2: 20 mmol/L — ABNORMAL LOW (ref 22–32)
Calcium: 10 mg/dL (ref 8.9–10.3)
Chloride: 107 mmol/L (ref 98–111)
Creatinine, Ser: 0.89 mg/dL (ref 0.44–1.00)
GFR, Estimated: >60 mL/min (ref >60)
Glucose, Bld: 138 mg/dL — ABNORMAL HIGH (ref 70–99)
Potassium: 2.9 mmol/L — ABNORMAL LOW (ref 3.5–5.1)
Sodium: 140 mmol/L (ref 135–145)
Total Bilirubin: 1 mg/dL (ref 0.0–1.2)
Total Protein: 7.6 g/dL (ref 6.5–8.1)

## 2023-10-02 LAB — PROTIME-INR
INR: 1 (ref 0.8–1.2)
Prothrombin Time: 13.4 s (ref 11.4–15.2)

## 2023-10-02 LAB — DIFFERENTIAL
Abs Immature Granulocytes: 0.01 10*3/uL (ref 0.00–0.07)
Basophils Absolute: 0.1 10*3/uL (ref 0.0–0.1)
Basophils Relative: 1 %
Eosinophils Absolute: 0.1 10*3/uL (ref 0.0–0.5)
Eosinophils Relative: 1 %
Immature Granulocytes: 0 %
Lymphocytes Relative: 48 %
Lymphs Abs: 3.1 10*3/uL (ref 0.7–4.0)
Monocytes Absolute: 0.6 10*3/uL (ref 0.1–1.0)
Monocytes Relative: 9 %
Neutro Abs: 2.6 10*3/uL (ref 1.7–7.7)
Neutrophils Relative %: 41 %

## 2023-10-02 LAB — APTT: aPTT: 28 s (ref 24–36)

## 2023-10-02 LAB — CBC
HCT: 39.6 % (ref 36.0–46.0)
Hemoglobin: 13.3 g/dL (ref 12.0–15.0)
MCH: 30.2 pg (ref 26.0–34.0)
MCHC: 33.6 g/dL (ref 30.0–36.0)
MCV: 90 fL (ref 80.0–100.0)
Platelets: 254 10*3/uL (ref 150–400)
RBC: 4.4 MIL/uL (ref 3.87–5.11)
RDW: 12.9 % (ref 11.5–15.5)
WBC: 6.4 10*3/uL (ref 4.0–10.5)
nRBC: 0 % (ref 0.0–0.2)

## 2023-10-02 LAB — CBG MONITORING, ED: Glucose-Capillary: 135 mg/dL — ABNORMAL HIGH (ref 70–99)

## 2023-10-02 LAB — MAGNESIUM: Magnesium: 2.1 mg/dL (ref 1.7–2.4)

## 2023-10-02 LAB — ETHANOL: Alcohol, Ethyl (B): <10 mg/dL (ref <10)

## 2023-10-02 LAB — MRSA NEXT GEN BY PCR, NASAL: MRSA by PCR Next Gen: NOT DETECTED

## 2023-10-02 LAB — HEMOGLOBIN A1C
Hgb A1c MFr Bld: 5.5 % (ref 4.8–5.6)
Mean Plasma Glucose: 111.15 mg/dL

## 2023-10-02 LAB — PHOSPHORUS: Phosphorus: 2.1 mg/dL — ABNORMAL LOW (ref 2.5–4.6)

## 2023-10-02 MED ORDER — STROKE: EARLY STAGES OF RECOVERY BOOK
Freq: Once | Status: AC
Start: 2023-10-03 — End: 2023-10-03

## 2023-10-02 MED ORDER — SODIUM CHLORIDE 0.9 % IV SOLN: INTRAVENOUS | Status: DC

## 2023-10-02 MED ORDER — ACETAMINOPHEN 160 MG/5ML PO SOLN: 650.0000 mg | ORAL | Status: DC | PRN

## 2023-10-02 MED ORDER — ACETAMINOPHEN 650 MG RE SUPP
650.0000 mg | RECTAL | Status: DC | PRN
Start: 2023-10-02 — End: 2023-10-04

## 2023-10-02 MED ORDER — SODIUM CHLORIDE 0.9% FLUSH
3.0000 mL | Freq: Once | INTRAVENOUS | Status: AC
  Administered 2023-10-02: 3 mL via INTRAVENOUS

## 2023-10-02 MED ORDER — ORAL CARE MOUTH RINSE: 15.0000 mL | OROMUCOSAL | Status: DC | PRN

## 2023-10-02 MED ORDER — POTASSIUM CHLORIDE 10 MEQ/100ML IV SOLN
10.0000 meq | INTRAVENOUS | Status: AC
  Administered 2023-10-02 (×6): 10 meq via INTRAVENOUS
  Filled 2023-10-02 (×6): qty 100

## 2023-10-02 MED ORDER — SENNOSIDES-DOCUSATE SODIUM 8.6-50 MG PO TABS: 1.0000 | ORAL_TABLET | Freq: Every evening | ORAL | Status: DC | PRN

## 2023-10-02 MED ORDER — CHLORHEXIDINE GLUCONATE CLOTH 2 % EX PADS
6.0000 | MEDICATED_PAD | Freq: Every day | CUTANEOUS | Status: DC
  Administered 2023-10-02 – 2023-10-03 (×2): 6 via TOPICAL

## 2023-10-02 MED ORDER — TENECTEPLASE FOR STROKE
0.2500 mg/kg | PACK | Freq: Once | INTRAVENOUS | Status: AC
  Administered 2023-10-02: 15 mg via INTRAVENOUS
  Filled 2023-10-02: qty 10

## 2023-10-02 MED ORDER — PANTOPRAZOLE SODIUM 40 MG IV SOLR
40.0000 mg | Freq: Every day | INTRAVENOUS | Status: DC
  Administered 2023-10-02: 40 mg via INTRAVENOUS
  Filled 2023-10-02: qty 10

## 2023-10-02 MED ORDER — IOHEXOL 350 MG/ML SOLN
75.0000 mL | Freq: Once | INTRAVENOUS | Status: AC | PRN
  Administered 2023-10-02: 75 mL via INTRAVENOUS

## 2023-10-02 MED ORDER — ACETAMINOPHEN 325 MG PO TABS
650.0000 mg | ORAL_TABLET | ORAL | Status: DC | PRN
Start: 2023-10-02 — End: 2023-10-04

## 2023-10-02 NOTE — ED Provider Notes (Signed)
 [place] EMERGENCY DEPARTMENT AT [facility] Provider Note   CSN: [id number] Arrival date & time: 10/02/23  0935     History  No chief complaint on file.   Kristy [name] is a 78 y.o. female.  78 yo F with a chief complaints of left-sided weakness and dizziness.  The patient woke up and she was at her normal state and then realized that she was having trouble moving her left arm.  She also felt a bit unsteady.  Arrived here as a code stroke.  Level 5 caveat acuity of condition.        Home Medications Prior to Admission medications   Medication Sig Start Date End Date Taking? Authorizing Provider  acetaminophen  (TYLENOL ) 325 MG tablet Take 650 mg by mouth every 6 (six) hours as needed.    [provider]  amLODipine  (NORVASC ) 5 MG tablet Take 1 tablet (5 mg total) by mouth daily. 12/26/19   Stallings, Zoe A, MD  cetirizine (ZYRTEC) 10 MG tablet Take 10 mg by mouth daily.    [provider]  famotidine  (PEPCID ) 20 MG tablet Take 1 tablet (20 mg total) by mouth 2 (two) times daily. 12/26/19   Stallings, Zoe A, MD  hydrochlorothiazide  (MICROZIDE ) 12.5 MG capsule Take 1 capsule (12.5 mg total) by mouth daily. 12/26/19   Stallings, Zoe A, MD  meclizine  (ANTIVERT ) 12.5 MG tablet Take 1 tablet (12.5 mg total) by mouth 3 (three) times daily as needed for dizziness. 12/26/19   Stallings, Zoe A, MD  Multiple Vitamins-Minerals (MULTIVITAMIN WITH MINERALS) tablet Take 1 tablet by mouth daily.    [provider]  simvastatin  (ZOCOR ) 40 MG tablet Take 1 tablet (40 mg total) by mouth daily at 6 PM. 12/26/19   Stallings, Zoe A, MD      Allergies    Patient has no known allergies.    Review of Systems   Review of Systems  Physical Exam Updated Vital Signs Wt 58.2 kg   BMI 23.47 kg/m  Physical Exam Vitals and nursing note reviewed.  Constitutional:      General: She is not in acute distress.    Appearance: She is well-developed. She is not diaphoretic.   HENT:     Head: Normocephalic and atraumatic.  Eyes:     Pupils: Pupils are equal, round, and reactive to light.  Cardiovascular:     Rate and Rhythm: Normal rate and regular rhythm.     Heart sounds: No murmur heard.    No friction rub. No gallop.  Pulmonary:     Effort: Pulmonary effort is normal.     Breath sounds: No wheezing or rales.  Abdominal:     General: There is no distension.     Palpations: Abdomen is soft.     Tenderness: There is no abdominal tenderness.  Musculoskeletal:        General: No tenderness.     Cervical back: Normal range of motion and neck supple.  Skin:    General: Skin is warm and dry.  Neurological:     Mental Status: She is alert and oriented to person, place, and time.  Psychiatric:        Behavior: Behavior normal.     ED Results / Procedures / Treatments   Labs (all labs ordered are listed, but only abnormal results are displayed) Labs Reviewed  I-STAT CHEM 8, ED - Abnormal; Notable for the following components:      Result Value   Potassium  2.9 (*)    Glucose, Bld 139 (*)    TCO2 21 (*)    All other components within normal limits  CBG MONITORING, ED - Abnormal; Notable for the following components:   Glucose-Capillary 135 (*)    All other components within normal limits  PROTIME-INR  APTT  CBC  DIFFERENTIAL  COMPREHENSIVE METABOLIC PANEL  ETHANOL  HEMOGLOBIN A1C    EKG None  Radiology CT HEAD CODE STROKE WO CONTRAST Result Date: 10/02/2023 CLINICAL DATA:  Code stroke.  78 year old female. EXAM: CT HEAD WITHOUT CONTRAST TECHNIQUE: Contiguous axial images were obtained from the base of the skull through the vertex without intravenous contrast. RADIATION DOSE REDUCTION: This exam was performed according to the departmental dose-optimization program which includes automated exposure control, adjustment of the mA and/or kV according to patient size and/or use of iterative reconstruction technique. COMPARISON:  Brain MRI  03/25/2012. FINDINGS: Brain: Mild for age cerebral volume loss compared to 2013. No midline shift, mass effect, or evidence of intracranial mass lesion. No ventriculomegaly. No acute intracranial hemorrhage identified. Asymmetric right external capsule hypodensity. But otherwise no convincing cytotoxic edema in the brain. Other symmetric gray-white differentiation. Vascular: Calcified atherosclerosis at the skull base. Asymmetric hyperdensity right MCA best seen on sagittal image 61. Skull: Intact, negative. Sinuses/Orbits: Visualized paranasal sinuses and mastoids are clear. Other: No gaze deviation. Visualized orbits and scalp soft tissues are within normal limits. ASPECTS [facility] Stroke Program Early CT Score) Total score (0-10 with 10 being normal): 9 vs 10 (asymmetrically hypodense right external capsule). IMPRESSION: Positive for asymmetric hyperdensity of the Right MCA, and hypodense right external capsule. No other cytotoxic edema identified (ASPECTS 9 versus 10). No intracranial hemorrhage or mass effect. These results were communicated to Dr. Arora at 9:48 am on 10/02/2023 by text page via the [facility] messaging system. Electronically Signed   By: VEAR Hurst M.D.   On: 10/02/2023 09:48    Procedures .Critical Care  Performed by: Emil Share, DO Authorized by: Emil Share, DO   Critical care provider statement:    Critical care time (minutes):  35   Critical care time was exclusive of:  Separately billable procedures and treating other patients   Critical care was time spent personally by me on the following activities:  Development of treatment plan with patient or surrogate, discussions with consultants, evaluation of patient's response to treatment, examination of patient, ordering and review of laboratory studies, ordering and review of radiographic studies, ordering and performing treatments and interventions, pulse oximetry, re-evaluation of patient's condition and review of old charts   Care  discussed with: admitting provider       Medications Ordered in ED Medications  sodium chloride  flush (NS) 0.9 % injection 3 mL (has no administration in time range)  tenecteplase  (TNKASE ) injection for Stroke 15 mg (has no administration in time range)   stroke: early stages of recovery book (has no administration in time range)  0.9 %  sodium chloride  infusion (has no administration in time range)  acetaminophen  (TYLENOL ) tablet 650 mg (has no administration in time range)    Or  acetaminophen  (TYLENOL ) 160 MG/5ML solution 650 mg (has no administration in time range)    Or  acetaminophen  (TYLENOL ) suppository 650 mg (has no administration in time range)  senna-docusate (Senokot-S) tablet 1 tablet (has no administration in time range)  pantoprazole  (PROTONIX ) injection 40 mg (has no administration in time range)  iohexol  (OMNIPAQUE ) 350 MG/ML injection 75 mL (75 mLs Intravenous Contrast Given 10/02/23 0950)  ED Course/ Medical Decision Making/ A&P                                 Medical Decision Making Amount and/or Complexity of Data Reviewed Labs: ordered. Radiology: ordered.  Risk Decision regarding hospitalization.   78 yo F with a chief complaints of acute onset left-sided weakness and a sensation of dizziness.  Patient airway cleared at the bridge and taken rapidly to CT.  Concern for acute stroke patient was given thrombolytics.  ICU admission.  The patients results and plan were reviewed and discussed.   Any x-rays performed were independently reviewed by myself.   Differential diagnosis were considered with the presenting HPI.  Medications  sodium chloride  flush (NS) 0.9 % injection 3 mL (has no administration in time range)  tenecteplase  (TNKASE ) injection for Stroke 15 mg (has no administration in time range)   stroke: early stages of recovery book (has no administration in time range)  0.9 %  sodium chloride  infusion (has no administration in time range)   acetaminophen  (TYLENOL ) tablet 650 mg (has no administration in time range)    Or  acetaminophen  (TYLENOL ) 160 MG/5ML solution 650 mg (has no administration in time range)    Or  acetaminophen  (TYLENOL ) suppository 650 mg (has no administration in time range)  senna-docusate (Senokot-S) tablet 1 tablet (has no administration in time range)  pantoprazole  (PROTONIX ) injection 40 mg (has no administration in time range)  iohexol  (OMNIPAQUE ) 350 MG/ML injection 75 mL (75 mLs Intravenous Contrast Given 10/02/23 0950)    Vitals:   10/02/23 0900  Weight: 58.2 kg    Final diagnoses:  Acute ischemic stroke [facility])    Admission/ observation were discussed with the admitting physician, patient and/or family and they are comfortable with the plan.          Final Clinical Impression(s) / ED Diagnoses Final diagnoses:  Acute ischemic stroke [facility])    Rx / DC Orders ED Discharge Orders     None         Emil Share, DO 10/02/23 [phone]

## 2023-10-02 NOTE — Progress Notes (Signed)
 SLP Cancellation Note  Patient Details Name: Kristy [name] MRN: [id number] DOB: [birth year]   Cancelled treatment:       Reason Eval/Treat Not Completed: Other (comment). Pt newly arrived to ED code stroke in progress. Will f/u for cognitive linguistic eval.    Mariska Daffin, Consuelo Fitch 10/02/2023, 10:00 AM

## 2023-10-02 NOTE — Code Documentation (Signed)
 Stroke Response Nurse Documentation Code Documentation  Kristy [name] is a 78 y.o. female arriving to [facility]  as code stroke via [facility] on 10/02/23 with past medical hx of HLD, HTN on no anticoagulants. Patient from home with LKW [phone] with she suddenly felt left side heaviness and called 911.   Stroke team at the bedside on patient arrival. Labs drawn and patient cleared for CT by EDP. Patient to CT with team. NIH 3, see flowsheet for details. CT/CTA completed. TNK given 0944. No LVO. Care Plan: vitals and NIH q43m x2h, q18m x 6h, q1h x 16h. SBP <180/105. Patient taken to ICU 4N21 with ED RN Elexes. Report given to [employer].  Tonna Lacks K  Rapid Response RN

## 2023-10-02 NOTE — Progress Notes (Signed)
 PHARMACIST CODE STROKE RESPONSE  Notified to mix TNK at 0942 by Dr. Wilford Corner TNK preparation completed at 0943  TNK dose = 15 mg IV over 5 seconds.   Issues/delays encountered (if applicable):   Kristy [name] 10/02/23 9:44 AM

## 2023-10-02 NOTE — H&P (Signed)
 NEUROLOGY H&P NOTE   Date of service: October 02, 2023 Patient Name: Kristy [name] MRN:  [id number] DOB:  1946/03/16 Chief Complaint: left sided weakness -- CODE STROKE  History of Present Illness  Kristy [name] is a 78 y.o. female  has a past medical history of Allergy, Anemia, Hyperlipidemia, and Hypertension.  She was in her typical state of health this a.m. when she found that her left arm and left leg felt heavy and she called 911.  She was brought in by EMS as code stroke.  Blood glucose 135.  Presenting BP 176/92.   No prior strokes.  No history of recent surgeries.  On no blood thinners.   Last known well: 0850  Modified rankin score: 2-Slight disability-UNABLE to perform all activities but does not need assistance IV Thrombolysis: Yes  Thrombectomy: No ELVO NIHSS components Score: Comment  1a Level of Conscious 0[x]  1[]  2[]  3[]      1b LOC Questions 0[x]  1[]  2[]       1c LOC Commands 0[x]  1[]  2[]       2 Best Gaze 0[x]  1[]  2[]       3 Visual 0[x]  1[]  2[]  3[]      4 Facial Palsy 0[]  1[x]  2[]  3[]      5a Motor Arm - left 0[]  1[x]  2[]  3[]  4[]  UN[]    5b Motor Arm - Right 0[x]  1[]  2[]  3[]  4[]  UN[]    6a Motor Leg - Left 0[]  1[x]  2[]  3[]  4[]  UN[]    6b Motor Leg - Right 0[x]  1[]  2[]  3[]  4[]  UN[]    7 Limb Ataxia 0[x]  1[]  2[]  3[]  UN[]     8 Sensory 0[x]  1[]  2[]  UN[]      9 Best Language 0[x]  1[]  2[]  3[]      10 Dysarthria 0[x]  1[]  2[]  UN[]      11 Extinct. and Inattention 0[x]  1[]  2[]       TOTAL: 3      ROS  Comprehensive ROS performed and pertinent positives documented in the HPI  Past History   Past Medical History:  Diagnosis Date   Allergy    Anemia    Hyperlipidemia    Hypertension    Past Surgical History:  Procedure Laterality Date   ABDOMINAL HYSTERECTOMY     PARTIAL HYSTERECTOMY     TUBAL LIGATION     Family History  Problem Relation Age of Onset   Heart attack Mother    Heart disease Mother        Heart attack   Hypertension Mother    Stroke Mother     Stroke Father    Stroke Sister    Stroke Brother    Social History   Socioeconomic History   Marital status: Divorced    Spouse name: n/a   Number of children: 2   Years of education: Not on file   Highest education level: Bachelor's degree (e.g., BA, AB, BS)  Occupational History   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   Social Drivers of Corporate Investment Banker Strain: Low Risk  (08/05/2017)   Overall Physicist, Medical Strain (  CARDIA)    Difficulty of Paying Living Expenses: Not hard at all  Food Insecurity: No Food Insecurity (08/05/2017)   Hunger Vital Sign    Worried About Running Out of Food in the Last Year: Never true    Ran Out of Food in the Last Year: Never true  Transportation Needs: No Transportation Needs (08/05/2017)   PRAPARE - Administrator, Civil Service (Medical): No    Lack of Transportation (Non-Medical): No  Physical Activity: Inactive (08/05/2017)   Exercise Vital Sign    Days of Exercise per Week: 0 days    Minutes of Exercise per Session: 0 min  Stress: No Stress Concern Present (08/05/2017)   [employer] of Occupational Health - Occupational Stress Questionnaire    Feeling of Stress : Not at all  Social Connections: Somewhat Isolated (08/05/2017)   Social Connection and Isolation Panel [NHANES]    Frequency of Communication with Friends and Family: More than three times a week    Frequency of Social Gatherings with Friends and Family: More than three times a week    Attends Religious Services: Never    Database Administrator or Organizations: Yes    Attends Hospital Doctor: More than 4 times per year    Marital Status: Divorced   No Known Allergies  Medications   Current Facility-Administered Medications:    sodium chloride  flush (NS) 0.9 % injection 3 mL, 3 mL, Intravenous, Once, Emil Share, DO  Current Outpatient Medications:    acetaminophen  (TYLENOL ) 325 MG tablet, Take 650 mg by mouth every 6 (six) hours as needed., Disp: , Rfl:    amLODipine  (NORVASC ) 5 MG tablet, Take 1 tablet (5 mg total) by mouth daily., Disp: 90 tablet, Rfl: 3   cetirizine (ZYRTEC) 10 MG tablet, Take 10 mg by mouth daily., Disp: , Rfl:    famotidine  (PEPCID ) 20 MG tablet, Take 1 tablet (20 mg total) by mouth 2 (two) times daily., Disp: 180 tablet, Rfl: 1   hydrochlorothiazide  (MICROZIDE ) 12.5 MG capsule, Take 1 capsule (12.5 mg total) by mouth daily., Disp: 90 capsule, Rfl: 1   meclizine  (ANTIVERT ) 12.5 MG tablet, Take 1 tablet (12.5 mg total) by mouth 3 (three) times daily as needed for dizziness., Disp: 30 tablet, Rfl: 0   Multiple Vitamins-Minerals (MULTIVITAMIN WITH MINERALS) tablet, Take 1 tablet by mouth daily., Disp: , Rfl:    simvastatin  (ZOCOR ) 40 MG tablet, Take 1 tablet (40 mg total) by mouth daily at 6 PM., Disp: 90 tablet, Rfl: 1   Vitals   Vitals:   10/02/23 0900  Weight: 58.2 kg   HR- ST 120, bp 176/92 Body mass index is 23.47 kg/m.  Physical Exam   Constitutional: Appears well-developed and well-nourished.  Psych: Affect appropriate to situation.  Head: Normocephalic. Atraumatic. Cardiovascular: tachy rate; reg rhythm Respiratory: Effort normal, non-labored breathing.  GI: Soft.  No distension.  Skin: WDI.   Neurologic Examination  NEURO:  Mental Status: AA&Ox3  Language: speech is intact. No dysarthria or aphasia. Cranial Nerves:  II: PERRL. Visual fields full to confrontation.  III, IV, VI: EOM intact. Eyelids elevate symmetrically.  V: Sensation intact V1-3 symmetrically  VII: left lower facial weakness. VIII: hearing intact to  voice IX, X: Phonation is normal.  XII: tongue is midline without fasciculations. Motor:  LUE/LLE drifts  No R sided weakness. Tone: is normal and bulk is normal Sensation- Intact to light touch bilaterally Coordination: FTN intact bilaterally, no ataxia in BLE. Gait-  deferred  NIHSS 3  Labs   CBC: No results for input(s): WBC, NEUTROABS, HGB, HCT, MCV, PLT in the last 168 hours. Basic Metabolic Panel:  Lab Results  Component Value Date   NA 140 12/26/2019   K 3.4 (L) 12/26/2019   CO2 24 12/26/2019   GLUCOSE 96 12/26/2019   BUN 7 (L) 12/26/2019   CREATININE 0.72 12/26/2019   CALCIUM  10.1 12/26/2019   GFRNONAA 85 12/26/2019   GFRAA 97 12/26/2019   Lipid Panel:  Lab Results  Component Value Date   LDLCALC 101 (H) 12/26/2019    CT Head without contrast(Personally reviewed): No hemorrhage or other acute abnormality.  CT angio Head and Neck with contrast(Personally reviewed): Narrowing of the R MCA.  No LVO.  MRI Brain ordered   Assessment   JENNESSY SANDRIDGE is a 78 y.o. female with PMH HTN and HLD who presents as stroke alert with left sided weakness. On examination has left lower facial weakness, left arm and leg drift, no sensory loss, no evidence of aphasia-symptoms consistent with either a small cortical infarct or more likely a small vessel etiology lacunar syndrome.  Primary Diagnosis:  Cerebral infarction, unspecified.   Secondary Diagnosis: Essential (primary) hypertension  Recommendations   Plan: Acute Ischemic Stroke Acuity: Acute Current Suspected Etiology: workup, but likely small vessel disease. -Admit to:ICU post TNK protocool -Continue Statin -Hold Aspirin  until 24 hour post IV thrombolysis (tPA or TNKase ) neuroimaging is stable and without evidence of bleeding -Blood pressure control, goal of < 180/105 -MRI/ECHO/A1C/Lipid panel. -Hyperglycemia management per SSI to maintain glucose 140-180mg /dL. -PT/OT/ST therapies and  recommendations when able  CNS Hemiplegia and hemiparesis following cerebral infarction affecting left non-dominant side  -PT/OT -PM&R consult  RESP O2 as needed to maintain o2 sat greater than 90%. No needs at this time.  CV Essential (primary) hypertension Maintain SBP less than 180 post TNK protocol. PRN labetolol/ cleviprex  Hyperlipidemia, unspecified  - Statin for goal LDL < 70  ENDO - check hgb a1c -SSI per protocol -goal HgbA1c < 7  Fluid/Electrolyte Disorders Hypokalemia- will await CMP and then replete if accurate. Repeat bmp daily  Nutrition Bedside swallow eval, if passes, heart healthy carb modified diet.  Prophylaxis DVT:  SCDs; hold off on Lovenox until 24 hours post tnk GI: Protonix  Bowel: Senna  Diet: NPO until cleared by bedside swallow evaluation  Code Status: Full Code    THE FOLLOWING WERE PRESENT ON ADMISSION: CNS -  Acute Ischemic Stroke Cardiovascular - Essential HTN GI - GERD Renal -  Hyperkalemia  - Jackolyn Chute, NP [facility]     Attending Neurohospitalist Addendum Patient seen and examined with  Jackolyn Chute, NP. Agree with the history and physical as documented above. Agree with the plan as documented, which I helped formulate. I have independently reviewed the chart, obtained history, review of systems and examined the patient.I have personally reviewed pertinent head/neck/spine imaging (CT/MRI).  Risks, benefits and alternatives of IVT discussed with patient  & they agreed to proceed. CTH personally reviewed prior to TNK administration  Please feel free to call with any questions.  -- Eligio Lav, MD Neurologist [facility] Pager: [phone]  CRITICAL CARE ATTESTATION Performed by: Eligio Lav, MD Total critical care time: 40 minutes Critical care time was exclusive of separately billable procedures and treating other patients and/or supervising APPs/Residents/Students Critical  care was necessary to treat or prevent imminent or life-threatening deterioration. This patient is critically ill and at significant risk for neurological worsening and/or death  and care requires constant monitoring. Critical care was time spent personally by me on the following activities: development of treatment plan with patient and/or surrogate as well as nursing, discussions with consultants, evaluation of patient's response to treatment, examination of patient, obtaining history from patient or surrogate, ordering and performing treatments and interventions, ordering and review of laboratory studies, ordering and review of radiographic studies, pulse oximetry, re-evaluation of patient's condition, participation in multidisciplinary rounds and medical decision making of high complexity in the care of this patient.

## 2023-10-02 NOTE — ED Triage Notes (Signed)
 Pt BIBGEMS from home after having a sudden onset of left sided weakness, difficulty walking, and left sided numbness. Pt last known well at 0850. LVO -

## 2023-10-03 ENCOUNTER — Inpatient Hospital Stay (HOSPITAL_COMMUNITY): Payer: Medicare Other

## 2023-10-03 DIAGNOSIS — I6389 Other cerebral infarction: Secondary | ICD-10-CM | POA: Diagnosis not present

## 2023-10-03 DIAGNOSIS — I639 Cerebral infarction, unspecified: Secondary | ICD-10-CM | POA: Diagnosis not present

## 2023-10-03 LAB — BASIC METABOLIC PANEL
Anion gap: 9 (ref 5–15)
BUN: 7 mg/dL — ABNORMAL LOW (ref 8–23)
CO2: 20 mmol/L — ABNORMAL LOW (ref 22–32)
Calcium: 9.4 mg/dL (ref 8.9–10.3)
Chloride: 112 mmol/L — ABNORMAL HIGH (ref 98–111)
Creatinine, Ser: 0.75 mg/dL (ref 0.44–1.00)
GFR, Estimated: >60 mL/min (ref >60)
Glucose, Bld: 85 mg/dL (ref 70–99)
Potassium: 3.8 mmol/L (ref 3.5–5.1)
Sodium: 141 mmol/L (ref 135–145)

## 2023-10-03 LAB — LIPID PANEL
Cholesterol: 163 mg/dL (ref 0–200)
HDL: 73 mg/dL (ref >40)
LDL Cholesterol: 83 mg/dL (ref 0–99)
Total CHOL/HDL Ratio: 2.2 {ratio}
Triglycerides: 33 mg/dL (ref <150)
VLDL: 7 mg/dL (ref 0–40)

## 2023-10-03 LAB — ECHOCARDIOGRAM COMPLETE
Area-P 1/2: 6.54 cm2
Calc EF: 65.6 %
Height: 62 in
S' Lateral: 2.4 cm
Single Plane A2C EF: 64.9 %
Single Plane A4C EF: 65.4 %
Weight: 2158.74 [oz_av]

## 2023-10-03 LAB — PHOSPHORUS: Phosphorus: 2.9 mg/dL (ref 2.5–4.6)

## 2023-10-03 LAB — MAGNESIUM: Magnesium: 2.2 mg/dL (ref 1.7–2.4)

## 2023-10-03 MED ORDER — ROSUVASTATIN CALCIUM 20 MG PO TABS
20.0000 mg | ORAL_TABLET | Freq: Every day | ORAL | Status: DC
  Administered 2023-10-03 – 2023-10-04 (×2): 20 mg via ORAL
  Filled 2023-10-03 (×3): qty 1

## 2023-10-03 NOTE — Discharge Summary (Addendum)
 Stroke Discharge Summary  Patient ID: samhita kretsch   MRN: [id number]      DOB: 1946-06-29  Date of Admission: 10/02/2023 Date of Discharge: 10/04/2023  Attending Physician:  Stroke, Md, MD Consultant(s):    None  Patient's PCP:  Dwight Trula SQUIBB, MD  DISCHARGE PRIMARY DIAGNOSIS:  Acute Ischemic Infarct: Small acute infarcts in the posterior aspect of the right putamen, posterior aspect of the right caudate nucleus and adjacent white matter s/p TNK Etiology: Likely embolic from cryptogenic source given large size of the subcortical infarct  Patient Active Problem List   Diagnosis Date Noted   Acute ischemic stroke (HCC) 10/02/2023   Screening for colon cancer 08/31/2018   Encounter for medication monitoring 08/31/2018   Dyslipidemia 08/31/2018   Dizziness 08/31/2018   Essential hypertension 08/31/2018   Tinnitus 08/05/2017   Paresthesia 08/05/2017   History of irregular heartbeat 08/05/2017   Allergic rhinitis 05/16/2014   Sleep pattern disturbance 11/23/2013   Dyspepsia 05/30/2013   Hypercholesteremia 04/05/2012     Secondary Diagnoses: Hypertension Hyperlipidemia  Allergies as of 10/04/2023   No Known Allergies      Medication List     STOP taking these medications    simvastatin  40 MG tablet Commonly known as: ZOCOR        TAKE these medications    acetaminophen  325 MG tablet Commonly known as: TYLENOL  Take 650 mg by mouth every 6 (six) hours as needed.   amLODipine  10 MG tablet Commonly known as: NORVASC  Take 10 mg by mouth daily.   aspirin  81 MG chewable tablet Chew 1 tablet (81 mg total) by mouth daily.   cetirizine 10 MG tablet Commonly known as: ZYRTEC Take 10 mg by mouth daily.   clopidogrel  75 MG tablet Commonly known as: PLAVIX  Take 1 tablet (75 mg total) by mouth daily.   famotidine  20 MG tablet Commonly known as: PEPCID  Take 1 tablet (20 mg total) by mouth 2 (two) times daily.   hydrochlorothiazide  12.5 MG capsule Commonly  known as: MICROZIDE  Take 1 capsule (12.5 mg total) by mouth daily.   meclizine  12.5 MG tablet Commonly known as: ANTIVERT  Take 1 tablet (12.5 mg total) by mouth 3 (three) times daily as needed for dizziness.   multivitamin with minerals tablet Take 1 tablet by mouth daily.   rosuvastatin  20 MG tablet Commonly known as: CRESTOR  Take 1 tablet (20 mg total) by mouth daily. Start taking on: October 05, 2023   Vitamin D  (Ergocalciferol ) 1.25 MG (50000 UNIT) Caps capsule Commonly known as: DRISDOL Take 50,000 Units by mouth every 7 (seven) days.        LABORATORY STUDIES CBC    Component Value Date/Time   WBC 5.5 10/04/2023 0450   RBC 4.19 10/04/2023 0450   HGB 12.6 10/04/2023 0450   HGB 12.8 02/03/2018 0859   HCT 37.3 10/04/2023 0450   HCT 38.8 02/03/2018 0859   PLT 228 10/04/2023 0450   PLT 254 02/03/2018 0859   MCV 89.0 10/04/2023 0450   MCV 90 02/03/2018 0859   MCH 30.1 10/04/2023 0450   MCHC 33.8 10/04/2023 0450   RDW 13.2 10/04/2023 0450   RDW 14.2 02/03/2018 0859   LYMPHSABS 3.1 10/02/2023 0938   LYMPHSABS 1.7 02/03/2018 0859   MONOABS 0.6 10/02/2023 0938   EOSABS 0.1 10/02/2023 0938   EOSABS 0.0 02/03/2018 0859   BASOSABS 0.1 10/02/2023 0938   BASOSABS 0.0 02/03/2018 0859   CMP    Component Value Date/Time  NA 140 10/04/2023 0450   NA 140 12/26/2019 1407   K 3.8 10/04/2023 0450   CL 108 10/04/2023 0450   CO2 21 (L) 10/04/2023 0450   GLUCOSE 93 10/04/2023 0450   BUN 10 10/04/2023 0450   BUN 7 (L) 12/26/2019 1407   CREATININE 0.63 10/04/2023 0450   CREATININE 0.79 08/03/2016 1005   CALCIUM  9.6 10/04/2023 0450   PROT 7.6 10/02/2023 0938   PROT 7.9 12/26/2019 1407   ALBUMIN 4.2 10/02/2023 0938   ALBUMIN 5.0 (H) 12/26/2019 1407   AST 24 10/02/2023 0938   ALT 18 10/02/2023 0938   ALKPHOS 83 10/02/2023 0938   BILITOT 1.0 10/02/2023 0938   BILITOT 0.6 12/26/2019 1407   GFRNONAA >60 10/04/2023 0450   GFRNONAA 85 05/16/2014 1045   GFRAA 97 12/26/2019  1407   GFRAA >89 05/16/2014 1045   COAGS Lab Results  Component Value Date   INR 1.0 10/02/2023   Lipid Panel    Component Value Date/Time   CHOL 163 10/03/2023 0411   CHOL 197 12/26/2019 1407   TRIG 33 10/03/2023 0411   HDL 73 10/03/2023 0411   HDL 86 12/26/2019 1407   CHOLHDL 2.2 10/03/2023 0411   VLDL 7 10/03/2023 0411   LDLCALC 83 10/03/2023 0411   LDLCALC 101 (H) 12/26/2019 1407   HgbA1C  Lab Results  Component Value Date   HGBA1C 5.5 10/02/2023   Alcohol Level    Component Value Date/Time   ETH <10 10/02/2023 9061     SIGNIFICANT DIAGNOSTIC STUDIES ECHOCARDIOGRAM COMPLETE Result Date: 10/03/2023    ECHOCARDIOGRAM REPORT   Patient Name:   NASHYA GARLINGTON [facility] Date of Exam: 10/03/2023 Medical Rec #:  [id number]       Height:       62.0 in Accession #:    [id number]      Weight:       134.9 lb Date of Birth:  [birth year]       BSA:          1.617 m Patient Age:    78 years        BP:           126/68 mmHg Patient Gender: F               HR:           70 bpm. Exam Location:  Inpatient Procedure: 2D Echo, Cardiac Doppler and Color Doppler Indications:    Stroke  History:        Patient has no prior history of Echocardiogram examinations.                 Stroke; Risk Factors:Hypertension.  Sonographer:    Lanell Maduro Referring Phys: [id number] ASHISH ARORA IMPRESSIONS  1. Left ventricular ejection fraction, by estimation, is 60 to 65%. The left ventricle has normal function. The left ventricle has no regional wall motion abnormalities. There is mild left ventricular hypertrophy. Left ventricular diastolic parameters are consistent with Grade I diastolic dysfunction (impaired relaxation).  2. Right ventricular systolic function is normal. The right ventricular size is normal. Tricuspid regurgitation signal is inadequate for assessing PA pressure.  3. The mitral valve is normal in structure. No evidence of mitral valve regurgitation.  4. There is mild calcification of the aortic valve.  Aortic valve regurgitation is not visualized.  5. The inferior vena cava is normal in size with greater than 50% respiratory variability, suggesting right atrial pressure of 3 mmHg. FINDINGS  Left Ventricle:  Left ventricular ejection fraction, by estimation, is 60 to 65%. The left ventricle has normal function. The left ventricle has no regional wall motion abnormalities. The left ventricular internal cavity size was normal in size. There is  mild left ventricular hypertrophy. Left ventricular diastolic parameters are consistent with Grade I diastolic dysfunction (impaired relaxation). Right Ventricle: The right ventricular size is normal. Right ventricular systolic function is normal. Tricuspid regurgitation signal is inadequate for assessing PA pressure. Left Atrium: Left atrial size was normal in size. Right Atrium: Right atrial size was normal in size. Pericardium: There is no evidence of pericardial effusion. Mitral Valve: The mitral valve is normal in structure. No evidence of mitral valve regurgitation. Tricuspid Valve: Tricuspid valve regurgitation is not demonstrated. Aortic Valve: There is mild calcification of the aortic valve. Aortic valve regurgitation is not visualized. Pulmonic Valve: Pulmonic valve regurgitation is not visualized. Aorta: The aortic root is normal in size and structure. Venous: The inferior vena cava is normal in size with greater than 50% respiratory variability, suggesting right atrial pressure of 3 mmHg. IAS/Shunts: No atrial level shunt detected by color flow Doppler.  LEFT VENTRICLE PLAX 2D LVIDd:         3.50 cm     Diastology LVIDs:         2.40 cm     LV e' medial:    8.21 cm/s LV PW:         1.10 cm     LV E/e' medial:  12.0 LV IVS:        1.30 cm     LV e' lateral:   9.51 cm/s LVOT diam:     1.80 cm     LV E/e' lateral: 10.4 LV SV:         52 LV SV Index:   32 LVOT Area:     2.54 cm  LV Volumes (MOD) LV vol d, MOD A2C: 46.7 ml LV vol d, MOD A4C: 32.1 ml LV vol s, MOD A2C:  16.4 ml LV vol s, MOD A4C: 11.1 ml LV SV MOD A2C:     30.3 ml LV SV MOD A4C:     32.1 ml LV SV MOD BP:      26.8 ml RIGHT VENTRICLE             IVC RV Basal diam:  2.80 cm     IVC diam: 1.20 cm RV S prime:     13.60 cm/s TAPSE (M-mode): 2.2 cm LEFT ATRIUM             Index        RIGHT ATRIUM           Index LA diam:        3.50 cm 2.16 cm/m   RA Area:     10.70 cm LA Vol (A2C):   17.6 ml 10.88 ml/m  RA Volume:   19.00 ml  11.75 ml/m LA Vol (A4C):   23.7 ml 14.65 ml/m LA Biplane Vol: 21.6 ml 13.36 ml/m  AORTIC VALVE LVOT Vmax:   113.00 cm/s LVOT Vmean:  67.200 cm/s LVOT VTI:    0.205 m  AORTA Ao Root diam: 2.80 cm MITRAL VALVE MV Area (PHT): 6.54 cm     SHUNTS MV Decel Time: 116 msec     Systemic VTI:  0.20 m MV E velocity: 98.50 cm/s   Systemic Diam: 1.80 cm MV A velocity: 119.00 cm/s MV E/A ratio:  0.83 Photographer signed by  [employer] Date/Time: 10/03/2023/10:44:30 AM    Final    MR BRAIN WO CONTRAST Result Date: 10/03/2023 CLINICAL DATA:  Stroke, follow-up. EXAM: MRI HEAD WITHOUT CONTRAST TECHNIQUE: Multiplanar, multiecho pulse sequences of the brain and surrounding structures were obtained without intravenous contrast. COMPARISON:  Head CT October 02, 2023. FINDINGS: Brain: Area of restricted diffusion involving the posterior aspect of the right putamen, posterior aspect of the body of the right caudate nucleus and adjacent white matter with associated focus of susceptibility artifact within the right caudate body. Other punctate foci of restricted diffusion are seen within the right frontal white matter and right postcentral gyrus. No hydrocephalus, extra-axial collection or mass lesion. Scattered and confluent foci of T2 hyperintensity are seen within the white matter of the cerebral hemispheres, nonspecific. Vascular: Normal flow voids. Skull and upper cervical spine: Normal marrow signal. Sinuses/Orbits: Negative. Other: None. IMPRESSION: 1. Acute infarct involving the  posterior aspect of the right putamen, posterior aspect of the body of the right caudate nucleus and adjacent white matter with associated petechial hemorrhage within the right caudate body. 2. Other punctate foci of restricted diffusion within the right frontal white matter and right postcentral gyrus, consistent with acute infarcts. 3. Scattered and confluent foci of T2 hyperintensity within the white matter of the cerebral hemispheres, nonspecific but may represent chronic microvascular ischemic changes. Electronically Signed   By: Katyucia  de Macedo Rodrigues M.D.   On: 10/03/2023 08:48   CT ANGIO HEAD NECK W WO CM (CODE STROKE) Result Date: 10/02/2023 CLINICAL DATA:  78 year old female code stroke with left side deficits. Status post T NK. EXAM: CT ANGIOGRAPHY HEAD AND NECK TECHNIQUE: Multidetector CT imaging of the head and neck was performed using the standard protocol during bolus administration of intravenous contrast. Multiplanar CT image reconstructions and MIPs were obtained to evaluate the vascular anatomy. Carotid stenosis measurements (when applicable) are obtained utilizing NASCET criteria, using the distal internal carotid diameter as the denominator. RADIATION DOSE REDUCTION: This exam was performed according to the departmental dose-optimization program which includes automated exposure control, adjustment of the mA and/or kV according to patient size and/or use of iterative reconstruction technique. CONTRAST:  75mL OMNIPAQUE  IOHEXOL  350 MG/ML SOLN COMPARISON:  Head CT 0942 hours today. FINDINGS: CTA NECK Skeleton: No acute osseous abnormality identified. Fairly age-appropriate cervical spine degeneration. Upper chest: Negative. Other neck: No acute finding. Aortic arch: 3 vessel arch.  Calcified aortic atherosclerosis. Right carotid system: Mild brachiocephalic artery soft plaque without stenosis. Negative right CCA aside from mild tortuosity. Mild to moderate calcified plaque at the right  ICA origin with less than 50 % stenosis with respect to the distal vessel. Left carotid system: Normal left CCA origin. Mildly tortuous left CCA. Mild to moderate calcified plaque posterior left ICA origin and bulb without stenosis. Vertebral arteries: Proximal right subclavian artery and right vertebral artery origin are normal. Tortuous right V1 segment. Right vertebral artery mildly tortuous and widely patent to the skull base with no plaque identified. Proximal left subclavian artery mild soft and calcified plaque without stenosis. Mild calcified plaque at the left vertebral artery origin on series 5, image 264 with no stenosis. Codominant left vertebral artery otherwise mildly tortuous and also widely patent to the skull base. CTA HEAD Posterior circulation: Patent and codominant distal vertebral arteries without stenosis. Patent PICA origins. There is calcified plaque at the vertebrobasilar junction on series 5, image 140, but mild. No stenosis. Patent AICA origins. Patent basilar artery. Patent SCA and PCA origins. Posterior  communicating arteries are diminutive or absent. Bilateral PCA branches are patent. On the left there is mild irregularity and stenosis of the P2 segment series 12, image 34. On the right there are mild to moderate tandem areas of P2 irregularity and stenosis on series 12, image 28. Anterior circulation: Both ICA siphons are patent. On the left there is moderate calcified plaque throughout the cavernous and supraclinoid segments. Subsequent moderate stenosis on series 8, image 197 and at the anterior genu just downstream. Contralateral right ICA siphon similar extensive calcified plaque. Similar moderate right siphon stenosis at the anterior genu and proximal supraclinoid segment. Both carotid termini are patent. MCA and ACA origins are patent. Dominant left ACA. Tortuous A1 segments. Ectatic and somewhat azygous appearing A2 anatomy. But no ACA branch stenosis identified. Left MCA M1  segment and bifurcation are patent without stenosis. Left MCA branches are within normal limits. Beginning just distal to the right MCA origin there is a 6-7 mm segment of moderate irregularity and stenosis of the vessel best seen on series 10, image 24. This seems to be a circumferential narrowing. This abates just proximal to the right MCA bifurcation which is patent without stenosis. No right MCA branch occlusion or stenosis identified. Venous sinuses: Patent. Anatomic variants: Dominant left ACA. Review of the MIP images confirms the above findings Preliminary results of the above discussed by telephone with Dr. ASHISH ARORA on 10/02/2023 at 09:59 . IMPRESSION: 1. Negative for large vessel occlusion. Positive for 6-7 mm segment of moderate Right MCA M1 irregularity and stenosis. This was discussed with Dr. ELIGIO LAV and intracranial atherosclerosis is favored at this time. 2. Underlying advanced calcified atherosclerosis of both ICA siphons, with moderate bilateral siphon stenosis. And up to moderate right greater than left PCA irregularity and stenosis somewhat similar to the appearance in #1. 3. Mild extracranial atherosclerosis, no significant extracranial stenosis. 4.  Aortic Atherosclerosis (ICD10-I70.0). Electronically Signed   By: VEAR Hurst M.D.   On: 10/02/2023 10:07   CT HEAD CODE STROKE WO CONTRAST Result Date: 10/02/2023 CLINICAL DATA:  Code stroke.  78 year old female. EXAM: CT HEAD WITHOUT CONTRAST TECHNIQUE: Contiguous axial images were obtained from the base of the skull through the vertex without intravenous contrast. RADIATION DOSE REDUCTION: This exam was performed according to the departmental dose-optimization program which includes automated exposure control, adjustment of the mA and/or kV according to patient size and/or use of iterative reconstruction technique. COMPARISON:  Brain MRI 03/25/2012. FINDINGS: Brain: Mild for age cerebral volume loss compared to 2013. No midline shift, mass  effect, or evidence of intracranial mass lesion. No ventriculomegaly. No acute intracranial hemorrhage identified. Asymmetric right external capsule hypodensity. But otherwise no convincing cytotoxic edema in the brain. Other symmetric gray-white differentiation. Vascular: Calcified atherosclerosis at the skull base. Asymmetric hyperdensity right MCA best seen on sagittal image 61. Skull: Intact, negative. Sinuses/Orbits: Visualized paranasal sinuses and mastoids are clear. Other: No gaze deviation. Visualized orbits and scalp soft tissues are within normal limits. ASPECTS [facility] Stroke Program Early CT Score) Total score (0-10 with 10 being normal): 9 vs 10 (asymmetrically hypodense right external capsule). IMPRESSION: Positive for asymmetric hyperdensity of the Right MCA, and hypodense right external capsule. No other cytotoxic edema identified (ASPECTS 9 versus 10). No intracranial hemorrhage or mass effect. These results were communicated to Dr. Arora at 9:48 am on 10/02/2023 by text page via the [facility] messaging system. Electronically Signed   By: VEAR Hurst M.D.   On: 10/02/2023 09:48  HISTORY OF PRESENT ILLNESS 78 y.o. patient with history of hypertension, hyperlipidemia and anemia was admitted with heaviness and weakness of the left arm and leg  HOSPITAL COURSE Patient was given TNK to treat stroke while in the ED.  Symptoms subsequently improved, and MRI demonstrated small acute infarcts in right putamen, right caudate nucleus and adjacent white matter.  Due to possible embolic appearance of her strokes, loop recorder was implanted.  Acute Ischemic Infarct: Small acute infarcts in the posterior aspect of the right putamen, posterior aspect of the right caudate nucleus and adjacent white matter s/p TNK Etiology: Likely embolic of cryptogenic source Code Stroke CT head asymmetric hypodensity of right MCA and hypodense right external capsule ASPECTS 9-10.    CTA head & neck no LVO, 6 to 7 mm  segment of moderate right MCA M1 irregularity and stenosis, calcified atherosclerosis of both ICA siphons MRI acute infarct involving posterior aspect of right putamen, posterior aspect of the right caudate nucleus and adjacent white matter with associated petechial hemorrhage and right carotid body, punctate foci of restricted diffusion within right frontal white matter and right postcentral gyrus, chronic microvascular ischemic changes 2D Echo EF 60 to 65%, mild LVH, grade 1 diastolic dysfunction, normal left atrial size, no atrial level shunt Will need loop recorder at discharge given embolic appearance of strokes LDL 83 HgbA1c 5.5 VTE prophylaxis -SCDs No antithrombotic prior to admission, now on No antithrombotic, plan to start DAPT tomorrow Therapy recommendations:  Outpatient PT/OT Disposition: home   Hypertension Home meds: Amlodipine  10 mg daily Stable Blood Pressure Goal: BP less than 180/105    Hyperlipidemia Home meds: Simvastatin  40 mg daily, changed to rosuvastatin  20 mg daily LDL 83, goal < 70 Continue statin at discharge   Other Stroke Risk Factors None     Other Active Problems  RN Pressure Injury Documentation:     DISCHARGE EXAM  PHYSICAL EXAM General:  Alert, well-nourished, well-developed patient in no acute distress Psych:  Mood and affect appropriate for situation CV: Regular rate and rhythm on monitor Respiratory:  Regular, unlabored respirations on room air GI: Abdomen soft and nontender  NEURO:  Mental Status: AA&Ox3  Speech/Language: speech is without dysarthria or aphasia.    Cranial Nerves:  II: PERRL. Visual fields full.  III, IV, VI: EOMI. Eyelids elevate symmetrically.  V: Sensation is intact to light touch and symmetrical to face.  VII: Smile is symmetrical.  VIII: hearing intact to voice. IX, X: Phonation is normal.  XII: tongue is midline without fasciculations. Motor: 5/5 strength to all muscle groups tested.  Tone: is normal  and bulk is normal Sensation- Intact to light touch bilaterally. Coordination: FTN intact bilaterally Gait- deferred  1a Level of Conscious.: 0 1b LOC Questions: 0 1c LOC Commands: 0 2 Best Gaze: 0 3 Visual: 0 4 Facial Palsy: 0 5a Motor Arm - left: 0 5b Motor Arm - Right: 0 6a Motor Leg - Left: 0 6b Motor Leg - Right: 0 7 Limb Ataxia: 0 8 Sensory: 0 9 Best Language: 0 10 Dysarthria: 0 11 Extinct. and Inatten.: 0 TOTAL: 0   Discharge Diet       Diet   Diet heart healthy/carb modified Fluid consistency: Thin   liquids  DISCHARGE PLAN Disposition: Home aspirin  81 mg daily and clopidogrel  75 mg daily for secondary stroke prevention for 3 weeks then aspirin  81 mg daily alone. Ongoing stroke risk factor control by Primary Care Physician at time of discharge Follow-up PCP Dwight Trula SQUIBB,  MD in 2 weeks. Follow-up in [facility] in 8 weeks, office to schedule an appointment.   32 minutes were spent preparing discharge.  Cortney E Everitt Clint Kill , MSN, AGACNP-BC [facility] See Amion for schedule and pager information 10/04/2023 12:15 PM   I have personally obtained history,examined this patient, reviewed notes, independently viewed imaging studies, participated in medical decision making and plan of care.ROS completed by me personally and pertinent positives fully documented  I have made any additions or clarifications directly to the above note. Agree with note above.    Eather Popp, MD Medical Director [facility] Pager: [phone] 10/04/2023 2:21 PM

## 2023-10-03 NOTE — Progress Notes (Signed)
 OT Cancellation Note  Patient Details Name: Kristy [name] MRN: [id number] DOB: [birth year]   Cancelled Treatment:    Reason Eval/Treat Not Completed: Active bedrest order OT order received and appreciated however this conflicts with current bedrest order set. Please increase activity tolerance as appropriate and remove bedrest from orders. . Please contact OT at [phone] if bed rest order is discontinued. OT will hold evaluation at this time and will check back as time allows pending increased activity orders.   Ely Molt 10/03/2023, 8:18 AM

## 2023-10-03 NOTE — Progress Notes (Addendum)
 STROKE TEAM PROGRESS NOTE   BRIEF HPI Ms. Kristy [name] is a 78 y.o. female with history of hypertension, hyperlipidemia and anemia presenting with heaviness and weakness of the left arm and leg.  She was given TNK in the emergency department with subsequent improvement of her symptoms.  NIH on Admission 3   SIGNIFICANT HOSPITAL EVENTS 1/12-patient admitted and TNK administered 1/13-patient transferred out of the ICU  INTERIM HISTORY/SUBJECTIVE Patient is doing very well this morning.  She feels she is back to her baseline.  She has no remaining deficits except mild diminished fine motor skills on the left hand. MRI scan shows a fairly large right basal ganglia infarct without hemorrhagic transformation. Blood pressure is adequately controlled.  No new complaints. OBJECTIVE  CBC    Component Value Date/Time   WBC 6.4 10/02/2023 0938   RBC 4.40 10/02/2023 0938   HGB 13.9 10/02/2023 0942   HGB 12.8 02/03/2018 0859   HCT 41.0 10/02/2023 0942   HCT 38.8 02/03/2018 0859   PLT 254 10/02/2023 0938   PLT 254 02/03/2018 0859   MCV 90.0 10/02/2023 0938   MCV 90 02/03/2018 0859   MCH 30.2 10/02/2023 0938   MCHC 33.6 10/02/2023 0938   RDW 12.9 10/02/2023 0938   RDW 14.2 02/03/2018 0859   LYMPHSABS 3.1 10/02/2023 0938   LYMPHSABS 1.7 02/03/2018 0859   MONOABS 0.6 10/02/2023 0938   EOSABS 0.1 10/02/2023 0938   EOSABS 0.0 02/03/2018 0859   BASOSABS 0.1 10/02/2023 0938   BASOSABS 0.0 02/03/2018 0859    BMET    Component Value Date/Time   NA 141 10/03/2023 0411   NA 140 12/26/2019 1407   K 3.8 10/03/2023 0411   CL 112 (H) 10/03/2023 0411   CO2 20 (L) 10/03/2023 0411   GLUCOSE 85 10/03/2023 0411   BUN 7 (L) 10/03/2023 0411   BUN 7 (L) 12/26/2019 1407   CREATININE 0.75 10/03/2023 0411   CREATININE 0.79 08/03/2016 1005   CALCIUM  9.4 10/03/2023 0411   GFRNONAA >60 10/03/2023 0411   GFRNONAA 85 05/16/2014 1045    IMAGING past 24 hours ECHOCARDIOGRAM COMPLETE Result Date:  10/03/2023    ECHOCARDIOGRAM REPORT   Patient Name:   COLTON ENGDAHL [facility] Date of Exam: 10/03/2023 Medical Rec #:  [id number]       Height:       62.0 in Accession #:    [id number]      Weight:       134.9 lb Date of Birth:  1945/10/30       BSA:          1.617 m Patient Age:    77 years        BP:           126/68 mmHg Patient Gender: F               HR:           70 bpm. Exam Location:  Inpatient Procedure: 2D Echo, Cardiac Doppler and Color Doppler Indications:    Stroke  History:        Patient has no prior history of Echocardiogram examinations.                 Stroke; Risk Factors:Hypertension.  Sonographer:    Lanell Maduro Referring Phys: [id number] ASHISH ARORA IMPRESSIONS  1. Left ventricular ejection fraction, by estimation, is 60 to 65%. The left ventricle has normal function. The left ventricle has no regional wall motion abnormalities. There  is mild left ventricular hypertrophy. Left ventricular diastolic parameters are consistent with Grade I diastolic dysfunction (impaired relaxation).  2. Right ventricular systolic function is normal. The right ventricular size is normal. Tricuspid regurgitation signal is inadequate for assessing PA pressure.  3. The mitral valve is normal in structure. No evidence of mitral valve regurgitation.  4. There is mild calcification of the aortic valve. Aortic valve regurgitation is not visualized.  5. The inferior vena cava is normal in size with greater than 50% respiratory variability, suggesting right atrial pressure of 3 mmHg. FINDINGS  Left Ventricle: Left ventricular ejection fraction, by estimation, is 60 to 65%. The left ventricle has normal function. The left ventricle has no regional wall motion abnormalities. The left ventricular internal cavity size was normal in size. There is  mild left ventricular hypertrophy. Left ventricular diastolic parameters are consistent with Grade I diastolic dysfunction (impaired relaxation). Right Ventricle: The right ventricular size  is normal. Right ventricular systolic function is normal. Tricuspid regurgitation signal is inadequate for assessing PA pressure. Left Atrium: Left atrial size was normal in size. Right Atrium: Right atrial size was normal in size. Pericardium: There is no evidence of pericardial effusion. Mitral Valve: The mitral valve is normal in structure. No evidence of mitral valve regurgitation. Tricuspid Valve: Tricuspid valve regurgitation is not demonstrated. Aortic Valve: There is mild calcification of the aortic valve. Aortic valve regurgitation is not visualized. Pulmonic Valve: Pulmonic valve regurgitation is not visualized. Aorta: The aortic root is normal in size and structure. Venous: The inferior vena cava is normal in size with greater than 50% respiratory variability, suggesting right atrial pressure of 3 mmHg. IAS/Shunts: No atrial level shunt detected by color flow Doppler.  LEFT VENTRICLE PLAX 2D LVIDd:         3.50 cm     Diastology LVIDs:         2.40 cm     LV e' medial:    8.21 cm/s LV PW:         1.10 cm     LV E/e' medial:  12.0 LV IVS:        1.30 cm     LV e' lateral:   9.51 cm/s LVOT diam:     1.80 cm     LV E/e' lateral: 10.4 LV SV:         52 LV SV Index:   32 LVOT Area:     2.54 cm  LV Volumes (MOD) LV vol d, MOD A2C: 46.7 ml LV vol d, MOD A4C: 32.1 ml LV vol s, MOD A2C: 16.4 ml LV vol s, MOD A4C: 11.1 ml LV SV MOD A2C:     30.3 ml LV SV MOD A4C:     32.1 ml LV SV MOD BP:      26.8 ml RIGHT VENTRICLE             IVC RV Basal diam:  2.80 cm     IVC diam: 1.20 cm RV S prime:     13.60 cm/s TAPSE (M-mode): 2.2 cm LEFT ATRIUM             Index        RIGHT ATRIUM           Index LA diam:        3.50 cm 2.16 cm/m   RA Area:     10.70 cm LA Vol (A2C):   17.6 ml 10.88 ml/m  RA Volume:   19.00 ml  11.75 ml/m LA Vol (A4C):   23.7 ml 14.65 ml/m LA Biplane Vol: 21.6 ml 13.36 ml/m  AORTIC VALVE LVOT Vmax:   113.00 cm/s LVOT Vmean:  67.200 cm/s LVOT VTI:    0.205 m  AORTA Ao Root diam: 2.80 cm MITRAL  VALVE MV Area (PHT): 6.54 cm     SHUNTS MV Decel Time: 116 msec     Systemic VTI:  0.20 m MV E velocity: 98.50 cm/s   Systemic Diam: 1.80 cm MV A velocity: 119.00 cm/s MV E/A ratio:  0.83 [place] signed by Ronal Ross Signature Date/Time: 10/03/2023/10:44:30 AM    Final    MR BRAIN WO CONTRAST Result Date: 10/03/2023 CLINICAL DATA:  Stroke, follow-up. EXAM: MRI HEAD WITHOUT CONTRAST TECHNIQUE: Multiplanar, multiecho pulse sequences of the brain and surrounding structures were obtained without intravenous contrast. COMPARISON:  Head CT October 02, 2023. FINDINGS: Brain: Area of restricted diffusion involving the posterior aspect of the right putamen, posterior aspect of the body of the right caudate nucleus and adjacent white matter with associated focus of susceptibility artifact within the right caudate body. Other punctate foci of restricted diffusion are seen within the right frontal white matter and right postcentral gyrus. No hydrocephalus, extra-axial collection or mass lesion. Scattered and confluent foci of T2 hyperintensity are seen within the white matter of the cerebral hemispheres, nonspecific. Vascular: Normal flow voids. Skull and upper cervical spine: Normal marrow signal. Sinuses/Orbits: Negative. Other: None. IMPRESSION: 1. Acute infarct involving the posterior aspect of the right putamen, posterior aspect of the body of the right caudate nucleus and adjacent white matter with associated petechial hemorrhage within the right caudate body. 2. Other punctate foci of restricted diffusion within the right frontal white matter and right postcentral gyrus, consistent with acute infarcts. 3. Scattered and confluent foci of T2 hyperintensity within the white matter of the cerebral hemispheres, nonspecific but may represent chronic microvascular ischemic changes. Electronically Signed   By: Katyucia  de Macedo Rodrigues M.D.   On: 10/03/2023 08:48    Vitals:   10/03/23 0644 10/03/23  0700 10/03/23 0800 10/03/23 0900  BP: 118/70 116/70 124/83 114/68  Pulse:  76 75 82  Resp:  13 14 12   Temp:   98.2 F (36.8 C)   TempSrc:   Oral   SpO2:  97% 98% 98%  Weight:      Height:         PHYSICAL EXAM General:  Alert, well-nourished, well-developed pleasant African-American lady in no acute distress Psych:  Mood and affect appropriate for situation CV: Regular rate and rhythm on monitor Respiratory:  Regular, unlabored respirations on room air   NEURO:  Mental Status: AA&Ox3, patient is able to give clear and coherent history Speech/Language: speech is without dysarthria or aphasia.    Cranial Nerves:  II: PERRL. Visual fields full.  III, IV, VI: EOMI. Eyelids elevate symmetrically.  V: Sensation is intact to light touch and symmetrical to face.  VII: Subtle left facial droop VIII: hearing intact to voice. IX, X: Phonation is normal.  XII: tongue is midline without fasciculations. Motor: Able to move all 4 extremities with good antigravity strength but diminished fine finger movements of the left.  Trace left grip weakness.  Orbits right over left upper extremity. Tone: is normal and bulk is normal Sensation- Intact to light touch bilaterally.  Coordination: FTN intact bilaterally Gait- deferred  Most Recent NIH  1a Level of Conscious.: 0 1b LOC Questions: 0 1c LOC Commands: 0 2  Best Gaze: 0 3 Visual: 0 4 Facial Palsy: 1 5a Motor Arm - left: 0 5b Motor Arm - Right: 0 6a Motor Leg - Left: 0 6b Motor Leg - Right: 0 7 Limb Ataxia: 0 8 Sensory: 0 9 Best Language: 0 10 Dysarthria: 0 11 Extinct. and Inatten.: 0 TOTAL: 1   ASSESSMENT/PLAN  Acute Ischemic Infarct: Small acute infarcts in the posterior aspect of the right putamen, posterior aspect of the right caudate nucleus and adjacent white matter s/p TNK Etiology: Likely embolic given large size Code Stroke CT head asymmetric hypodensity of right MCA and hypodense right external capsule ASPECTS 9-10.     CTA head & neck no LVO, 6 to 7 mm segment of moderate right MCA M1 irregularity and stenosis, calcified atherosclerosis of both ICA siphons MRI acute infarct involving posterior aspect of right putamen, posterior aspect of the right caudate nucleus and adjacent white matter with associated petechial hemorrhage and right carotid body, punctate foci of restricted diffusion within right frontal white matter and right postcentral gyrus, chronic microvascular ischemic changes 2D Echo EF 60 to 65%, mild LVH, grade 1 diastolic dysfunction, normal left atrial size, no atrial level shunt May need loop recorder at discharge given embolic appearance of strokes LDL 83 HgbA1c 5.5 VTE prophylaxis -SCDs No antithrombotic prior to admission, now on No antithrombotic, plan to start DAPT tomorrow Therapy recommendations:  Pending Disposition: Pending, will transfer out of ICU today  Hypertension Home meds: Amlodipine  10 mg daily Stable Blood Pressure Goal: BP less than 180/105   Hyperlipidemia Home meds: Simvastatin  40 mg daily, changed to rosuvastatin  20 mg daily LDL 83, goal < 70 Continue statin at discharge  Other Stroke Risk Factors None   Other Active Problems None  Hospital day # 1  Patient seen by NP with MD, MD to edit note as needed. Cortney E Everitt Clint Kill , MSN, AGACNP-BC [facility] See Amion for schedule and pager information 10/03/2023 11:15 AM   STROKE MD NOTE :  I have personally obtained history,examined this patient, reviewed notes, independently viewed imaging studies, participated in medical decision making and plan of care.ROS completed by me personally and pertinent positives fully documented  I have made any additions or clarifications directly to the above note. Agree with note above.  Patient presented with sudden onset left hemiplegia and sensory loss in was treated with IV TNK and has made near complete recovery.  Continue close neurological monitoring and  strict blood pressure control as per post TNK protocol.  Mobilize out of bed.  Physical Occupational Therapy consults.  Check 24 brain imaging and start on dual antiplatelet therapy for 3 weeks followed by aspirin  alone.  Aggressive risk factor modification.  Patient will need prolonged cardiac monitoring with loop recorder at discharge to look for paroxysmal A-fib.  Long discussion with patient and son and daughter at the bedside and answered questions.  Patient may also consider possible participation in oceanic stroke prevention study and was given information to review and decide. This patient is critically ill and at significant risk of neurological worsening, death and care requires constant monitoring of vital signs, hemodynamics,respiratory and cardiac monitoring, extensive review of multiple databases, frequent neurological assessment, discussion with family, other specialists and medical decision making of high complexity.I have made any additions or clarifications directly to the above note.This critical care time does not reflect procedure time, or teaching time or supervisory time of PA/NP/Med Resident etc but could involve care discussion time.  I spent 30  minutes of neurocritical care time  in the care of  this patient.     Eather Popp, MD Medical Director [facility] Pager: [phone] 10/03/2023 3:02 PM   To contact Stroke Continuity provider, please refer to [email or web address]. After hours, contact General Neurology

## 2023-10-03 NOTE — Evaluation (Signed)
 Physical Therapy Evaluation Patient Details Name: Kristy [name] MRN: [id number] DOB: Apr 08, 1946 Today's Date: 10/03/2023  History of Present Illness  78 yo female admitted 10/02/23 with L side weakness and MR (+) posterior aspect of R putamen posterior body of R caudate nucleus and adjacent white matter associated petechial hemorrhage within the right caudate body, other punctate foci of restricted diffusion within the right frontal white matter and right postcentral gyrus, consistent with acute infarcts.  Recieved TNK.  PMH HLD HTN  Clinical Impression  Patient presents with decreased mobility due to L LE weakness and incoordination with decreased balance and at risk for falls.  Previously living with son though independent including driving.  She needs CGA to min A for hallway ambulation without assistive device noting L foot heaviness and needing increased time to compensate.  Patient will benefit from skilled PT in the acute setting and recommend follow up outpatient PT at d/c.       If plan is discharge home, recommend the following: Help with stairs or ramp for entrance;A little help with walking and/or transfers;Assist for transportation;A little help with bathing/dressing/bathroom   Can travel by private vehicle        Equipment Recommendations Other (comment) (will trial SPC versus RW)  Recommendations for Other Services       Functional Status Assessment Patient has had a recent decline in their functional status and demonstrates the ability to make significant improvements in function in a reasonable and predictable amount of time.     Precautions / Restrictions Precautions Precautions: Fall Restrictions Weight Bearing Restrictions Per Provider Order: No      Mobility  Bed Mobility Overal bed mobility: Modified Independent                  Transfers Overall transfer level: Needs assistance Equipment used: None Transfers: Sit to/from Stand Sit to Stand:  Supervision           General transfer comment: holding chair in front of her    Ambulation/Gait Ambulation/Gait assistance: Contact guard assist, Min assist Gait Distance (Feet): 200 Feet Assistive device: None Gait Pattern/deviations: Step-to pattern, Step-through pattern, Decreased stride length       General Gait Details: feels wobbly on her feet, though feeling better than initially, note LOB x 2 with min A to recover  Stairs            Wheelchair Mobility     Tilt Bed    Modified Rankin (Stroke Patients Only) Modified Rankin (Stroke Patients Only) Pre-Morbid Rankin Score: No symptoms Modified Rankin: Moderately severe disability     Balance Overall balance assessment: Needs assistance   Sitting balance-Leahy Scale: Good     Standing balance support: No upper extremity supported Standing balance-Leahy Scale: Fair                 High Level Balance Comments: performed step taps to edge of trash can with 1 hand on foot board and HHA on the other x 10 reps for coordination/balance, bilat heel raises with bilat UE support, hip abduction alternating with UE support and stepping forward and back with UE support             Pertinent Vitals/Pain Pain Assessment Pain Assessment: No/denies pain    Home Living Family/patient expects to be discharged to:: Private residence Living Arrangements: Children Available Help at Discharge: Family;Available PRN/intermittently Type of Home: House Home Access: Stairs to enter Entrance Stairs-Rails: Right Entrance Stairs-Number of Steps: 3 front and  back   Home Layout: One level Home Equipment: Hand held shower head;Shower seat Additional Comments: son works early shift, home by 2 pm    Prior Function Prior Level of Function : Driving;Independent/Modified Independent                     Extremity/Trunk Assessment   Upper Extremity Assessment Upper Extremity Assessment: Defer to OT  evaluation LUE Deficits / Details: reports back to normal no deficits noted in testing, pt able to complete pad to pad movement LUE Sensation: WNL LUE Coordination: WNL    Lower Extremity Assessment Lower Extremity Assessment: LLE deficits/detail LLE Deficits / Details: some incoordination and weakness in function with limited stride length and ankle DF    Cervical / Trunk Assessment Cervical / Trunk Assessment: Normal  Communication   Communication Communication: No apparent difficulties  Cognition Arousal: Alert Behavior During Therapy: WFL for tasks assessed/performed Overall Cognitive Status: Within Functional Limits for tasks assessed                                          General Comments General comments (skin integrity, edema, etc.): VSS; discussed walking devices for balance    Exercises     Assessment/Plan    PT Assessment Patient needs continued PT services  PT Problem List Decreased strength;Decreased balance;Decreased mobility;Decreased coordination       PT Treatment Interventions DME instruction;Therapeutic activities;Gait training;Therapeutic exercise;Patient/family education;Balance training;Functional mobility training;Stair training    PT Goals (Current goals can be found in the Care Plan section)  Acute Rehab PT Goals Patient Stated Goal: return to independent PT Goal Formulation: With patient Time For Goal Achievement: 10/17/23 Potential to Achieve Goals: Good    Frequency Min 1X/week     Co-evaluation               AM-PAC PT 6 Clicks Mobility  Outcome Measure Help needed turning from your back to your side while in a flat bed without using bedrails?: None Help needed moving from lying on your back to sitting on the side of a flat bed without using bedrails?: None Help needed moving to and from a bed to a chair (including a wheelchair)?: A Little Help needed standing up from a chair using your arms (e.g.,  wheelchair or bedside chair)?: A Little Help needed to walk in hospital room?: A Little Help needed climbing 3-5 steps with a railing? : Total 6 Click Score: 18    End of Session Equipment Utilized During Treatment: Gait belt Activity Tolerance: Patient tolerated treatment well Patient left: in bed;with call bell/phone within reach   PT Visit Diagnosis: Other abnormalities of gait and mobility (R26.89);Muscle weakness (generalized) (M62.81)    Time: 8482-8457 PT Time Calculation (min) (ACUTE ONLY): 25 min   Charges:   PT Evaluation $PT Eval Moderate Complexity: 1 Mod PT Treatments $Gait Training: 8-22 mins PT General Charges $$ ACUTE PT VISIT: 1 Visit         Micheline Portal, PT Acute Rehabilitation Services Office:[phone] 10/03/2023   Montie Portal 10/03/2023, 4:27 PM

## 2023-10-03 NOTE — TOC CM/SW Note (Signed)
 Transition of Care [facility]) - Inpatient Brief Assessment   Patient Details  Name: Kristy [name] MRN: [id number] Date of Birth: Sep 05, 1946  [facility]) CM/SW Contact:    Shandy Vi M, RN Phone Number: 10/03/2023, 5:16 PM   Clinical Narrative: 78 yo female admitted 10/02/23 with L side weakness and MR (+) posterior aspect of R putamen posterior body of R caudate nucleus and adjacent white matter associated petechial hemorrhage within the right caudate body, other punctate foci of restricted diffusion within the right frontal white matter and right postcentral gyrus, consistent with acute infarcts. Recieved TNK.    Transition of Care Asessment: Insurance and Status: Insurance coverage has been reviewed Patient has primary care physician: Yes (Dr. Dwight) Home environment has been reviewed: Lives with son Prior level of function:: Independent of ADLS Prior/Current Home Services: No current home services Social Drivers of Health Review: SDOH reviewed no interventions necessary Readmission risk has been reviewed: Yes Transition of care needs: transition of care needs identified, TOC will continue to follow  Kristy MICAEL Fass, RN, BSN  Trauma/Neuro ICU Case Manager [phone]

## 2023-10-03 NOTE — Evaluation (Signed)
 Speech Language Pathology Evaluation Patient Details Name: Kristy [name] MRN: [id number] DOB: 1945/09/29 Today's Date: 10/03/2023 Time: 8890-8878 SLP Time Calculation (min) (ACUTE ONLY): 12 min  Problem List:  Patient Active Problem List   Diagnosis Date Noted   Acute ischemic stroke (HCC) 10/02/2023   Screening for colon cancer 08/31/2018   Encounter for medication monitoring 08/31/2018   Dyslipidemia 08/31/2018   Dizziness 08/31/2018   Essential hypertension 08/31/2018   Tinnitus 08/05/2017   Paresthesia 08/05/2017   History of irregular heartbeat 08/05/2017   Allergic rhinitis 05/16/2014   Sleep pattern disturbance 11/23/2013   Dyspepsia 05/30/2013   Hypercholesteremia 04/05/2012   Past Medical History:  Past Medical History:  Diagnosis Date   Allergy    Anemia    Hyperlipidemia    Hypertension    Past Surgical History:  Past Surgical History:  Procedure Laterality Date   ABDOMINAL HYSTERECTOMY     PARTIAL HYSTERECTOMY     TUBAL LIGATION     HPI:  Kristy [name] is a 78 y.o. female  has a past medical history of Allergy, Anemia, Hyperlipidemia, and Hypertension.  She was in her typical state of health this a.m. when she found that her left arm and left leg felt heavy and she called 911.  MRI acute infarct involving the posterior aspect of the right  putamen, posterior aspect of the body of the right caudate nucleus  and adjacent white matter with associated petechial hemorrhage  within the right caudate body, other punctate foci of restricted diffusion within the right frontal white matter and right postcentral gyrus, consistent with acute infarcts, scattered and confluent foci of T2 hyperintensity within the white matter of the cerebral hemispheres, nonspecific but may  represent chronic microvascular ischemic changes.   Assessment / Plan / Recommendation Clinical Impression  Pt is alert, conversant and has no concerns with speech-language-cognition prior to  admission or at present. She was independent and lives at home with her son. Pt was given the Cognistat and scored within average range on all subtests including orientation, memory (recalled 4/4 words independently), attention, reasoning, language and calculations. Speech is intelligible and language within functional limits. Pt states she does not feel she will have difficulty managing her finances or taking her meds and explained what system she uses to organize her medications which she prefers over use of her pill box. No furhter ST is needed and pt in agreement.    SLP Assessment  SLP Recommendation/Assessment: Patient does not need any further Speech Lanaguage Pathology Services SLP Visit Diagnosis: Cognitive communication deficit (R41.841)    Recommendations for follow up therapy are one component of a multi-disciplinary discharge planning process, led by the attending physician.  Recommendations may be updated based on patient status, additional functional criteria and insurance authorization.    Follow Up Recommendations  No SLP follow up    Assistance Recommended at Discharge  None  Functional Status Assessment Patient has not had a recent decline in their functional status  Frequency and Duration           SLP Evaluation Cognition  Overall Cognitive Status: Within Functional Limits for tasks assessed Arousal/Alertness: Awake/alert Orientation Level: Oriented X4 Year: 2025 Day of Week: Correct Attention: Sustained Sustained Attention: Appears intact Memory: Appears intact (4/4 word recall independently) Awareness: Appears intact Problem Solving: Appears intact Safety/Judgment: Appears intact       Comprehension  Auditory Comprehension Overall Auditory Comprehension: Appears within functional limits for tasks assessed Commands: Within Functional Limits Visual Recognition/Discrimination  Discrimination: Not tested Reading Comprehension Reading Status: Not tested     Expression Expression Primary Mode of Expression: Verbal Verbal Expression Overall Verbal Expression: Appears within functional limits for tasks assessed Initiation: No impairment Level of Generative/Spontaneous Verbalization: Conversation Repetition: No impairment Naming: No impairment Pragmatics: No impairment Written Expression Written Expression: Not tested   Oral / Motor  Oral Motor/Sensory Function Overall Oral Motor/Sensory Function: Within functional limits Motor Speech Overall Motor Speech: Appears within functional limits for tasks assessed Respiration: Within functional limits Phonation: Normal Resonance: Within functional limits Articulation: Within functional limitis Intelligibility: Intelligible Motor Planning: Witnin functional limits Motor Speech Errors: Not applicable            Dustin Olam Bull 10/03/2023, 1:03 PM

## 2023-10-03 NOTE — Evaluation (Signed)
 Occupational Therapy Evaluation Patient Details Name: Kristy [name] MRN: [id number] DOB: 1945-12-05 Today's Date: 10/03/2023   History of Present Illness 78 yo admitted with L side weakness and MR (+) posterior aspect of R putamen posterior bodu pf R caudate nucleus and adjacent white matter associated petechial hemorrhage within the right caudate body, other punctate foci of restricted diffusion within the right frontal white matter and right postcentral gyrus, consistent with acute infarcts, scattered and confluent foci of T2 hyperintensity within the white matter of the cerebral hemispheres, nonspecific but may  represent chronic microvascular ischemic changes. . female PMH HLD HTN   Clinical Impression   PT admitted with CVA. Pt currently with functional limitiations due to the deficits listed below (see OT problem list). Pt at baseline lives with son and indep with all adls/ iadls. Pt with LLE weakness noted during transfers and pt verbalizing.  Pt will benefit from skilled OT to increase their independence and safety with adls and balance to allow discharge outpatient follow up for balance.        If plan is discharge home, recommend the following: A little help with walking and/or transfers;A little help with bathing/dressing/bathroom    Functional Status Assessment  Patient has had a recent decline in their functional status and demonstrates the ability to make significant improvements in function in a reasonable and predictable amount of time.  Equipment Recommendations  None recommended by OT    Recommendations for Other Services       Precautions / Restrictions Precautions Precautions: Fall Restrictions Weight Bearing Restrictions Per Provider Order: No      Mobility Bed Mobility Overal bed mobility: Modified Independent             General bed mobility comments: flat bed exit R side    Transfers Overall transfer level: Needs assistance   Transfers: Sit  to/from Stand Sit to Stand: Supervision                  Balance Overall balance assessment: Mild deficits observed, not formally tested                                         ADL either performed or assessed with clinical judgement   ADL Overall ADL's : Needs assistance/impaired Eating/Feeding: Modified independent   Grooming: Wash/dry face;Wash/dry hands;Oral care;Supervision/safety;Standing   Upper Body Bathing: Supervision/ safety   Lower Body Bathing: Contact guard assist   Upper Body Dressing : Supervision/safety   Lower Body Dressing: Contact guard assist   Toilet Transfer: Contact guard assist           Functional mobility during ADLs: Contact guard assist (decreased gait velocity) General ADL Comments: pt reports L foot feels different and very guarded with transfer. pt states she has white coat syndrome.     Vision Baseline Vision/History: 1 Wears glasses Ability to See in Adequate Light: 0 Adequate Vision Assessment?: No apparent visual deficits     Perception Perception: Within Functional Limits       Praxis         Pertinent Vitals/Pain Pain Assessment Pain Assessment: No/denies pain     Extremity/Trunk Assessment Upper Extremity Assessment Upper Extremity Assessment: Right hand dominant;LUE deficits/detail LUE Deficits / Details: reports back to normal no deficits noted in testing, pt able to complete pad to pad movement LUE Sensation: WNL LUE Coordination: WNL   Lower Extremity  Assessment Lower Extremity Assessment: Defer to PT evaluation;LLE deficits/detail LLE Deficits / Details: reports feels funny   Cervical / Trunk Assessment Cervical / Trunk Assessment: Normal   Communication Communication Communication: No apparent difficulties   Cognition Arousal: Alert Behavior During Therapy: WFL for tasks assessed/performed Overall Cognitive Status: Within Functional Limits for tasks assessed                                        General Comments  VSS BP    Exercises     Shoulder Instructions      Home Living Family/patient expects to be discharged to:: Private residence Living Arrangements: Children Available Help at Discharge: Family;Available PRN/intermittently Type of Home: House Home Access: Stairs to enter [employer] of Steps: 3 front and back Entrance Stairs-Rails: Right Home Layout: One level     Bathroom Shower/Tub: Chief Strategy Officer: Handicapped height Bathroom Accessibility: Yes How Accessible: Accessible via walker Home Equipment: Hand held shower head;Shower seat   Additional Comments: no animals  Lives With: Son    Prior Functioning/Environment Prior Level of Function : Driving;Independent/Modified Independent                        OT Problem List: Impaired balance (sitting and/or standing)      OT Treatment/Interventions: Therapeutic exercise;Neuromuscular education;DME and/or AE instruction;Self-care/ADL training;Therapeutic activities;Patient/family education;Balance training    OT Goals(Current goals can be found in the care plan section) Acute Rehab OT Goals Patient Stated Goal: to go to the bathroom and not use the purewick OT Goal Formulation: With patient Time For Goal Achievement: 10/17/23 Potential to Achieve Goals: Good  OT Frequency: Min 1X/week    Co-evaluation              AM-PAC OT 6 Clicks Daily Activity     Outcome Measure Help from another person eating meals?: None Help from another person taking care of personal grooming?: None Help from another person toileting, which includes using toliet, bedpan, or urinal?: A Little Help from another person bathing (including washing, rinsing, drying)?: A Little Help from another person to put on and taking off regular upper body clothing?: None Help from another person to put on and taking off regular lower body clothing?: A Little 6  Click Score: 21   End of Session Nurse Communication: Mobility status;Precautions  Activity Tolerance: Patient tolerated treatment well Patient left: in chair;with call bell/phone within reach  OT Visit Diagnosis: Unsteadiness on feet (R26.81)                Time: 1355-1416 OT Time Calculation (min): 21 min Charges:  OT General Charges $OT Visit: 1 Visit OT Evaluation $OT Eval Moderate Complexity: 1 Mod   Brynn, OTR/L  Acute Rehabilitation Services Office: [phone] .   Ely Molt 10/03/2023, 2:26 PM

## 2023-10-04 ENCOUNTER — Inpatient Hospital Stay (HOSPITAL_COMMUNITY)
Admission: EM | Disposition: A | Discharge status: Home / Self Care | Payer: Self-pay | Source: Home / Self Care | Attending: Neurology

## 2023-10-04 DIAGNOSIS — I639 Cerebral infarction, unspecified: Secondary | ICD-10-CM | POA: Diagnosis not present

## 2023-10-04 HISTORY — PX: LOOP RECORDER INSERTION: EP1214

## 2023-10-04 LAB — BASIC METABOLIC PANEL
Anion gap: 11 (ref 5–15)
BUN: 10 mg/dL (ref 8–23)
CO2: 21 mmol/L — ABNORMAL LOW (ref 22–32)
Calcium: 9.6 mg/dL (ref 8.9–10.3)
Chloride: 108 mmol/L (ref 98–111)
Creatinine, Ser: 0.63 mg/dL (ref 0.44–1.00)
GFR, Estimated: >60 mL/min (ref >60)
Glucose, Bld: 93 mg/dL (ref 70–99)
Potassium: 3.8 mmol/L (ref 3.5–5.1)
Sodium: 140 mmol/L (ref 135–145)

## 2023-10-04 LAB — CBC
HCT: 37.3 % (ref 36.0–46.0)
Hemoglobin: 12.6 g/dL (ref 12.0–15.0)
MCH: 30.1 pg (ref 26.0–34.0)
MCHC: 33.8 g/dL (ref 30.0–36.0)
MCV: 89 fL (ref 80.0–100.0)
Platelets: 228 10*3/uL (ref 150–400)
RBC: 4.19 MIL/uL (ref 3.87–5.11)
RDW: 13.2 % (ref 11.5–15.5)
WBC: 5.5 10*3/uL (ref 4.0–10.5)
nRBC: 0 % (ref 0.0–0.2)

## 2023-10-04 SURGERY — LOOP RECORDER INSERTION

## 2023-10-04 MED ORDER — STROKE: EARLY STAGES OF RECOVERY BOOK
Status: AC
  Filled 2023-10-04: qty 1

## 2023-10-04 MED ORDER — ASPIRIN 81 MG PO CHEW: 81.0000 mg | CHEWABLE_TABLET | Freq: Every day | ORAL | 2 refills | Status: DC

## 2023-10-04 MED ORDER — ASPIRIN 81 MG PO CHEW: 81.0000 mg | CHEWABLE_TABLET | Freq: Every day | ORAL | Status: DC

## 2023-10-04 MED ORDER — LIDOCAINE-EPINEPHRINE 1 %-1:100000 IJ SOLN
INTRAMUSCULAR | Status: AC
  Filled 2023-10-04: qty 1

## 2023-10-04 MED ORDER — CLOPIDOGREL BISULFATE 75 MG PO TABS: 75.0000 mg | ORAL_TABLET | Freq: Every day | ORAL | Status: DC

## 2023-10-04 MED ORDER — CLOPIDOGREL BISULFATE 75 MG PO TABS: 75.0000 mg | ORAL_TABLET | Freq: Every day | ORAL | 0 refills | Status: DC

## 2023-10-04 MED ORDER — ROSUVASTATIN CALCIUM 20 MG PO TABS: 20.0000 mg | ORAL_TABLET | Freq: Every day | ORAL | 2 refills | Status: DC

## 2023-10-04 SURGICAL SUPPLY — 2 items
MONITOR REVEAL LINQ II (Prosthesis & Implant Heart) IMPLANT
PACK LOOP INSERTION (CUSTOM PROCEDURE TRAY) ×1 IMPLANT

## 2023-10-04 NOTE — Consult Note (Signed)
 ELECTROPHYSIOLOGY CONSULT NOTE  Patient ID: Kristy [name] MRN: [id number], DOB/AGE: [birth year]   Admit date: 10/02/2023 Date of Consult: 10/04/2023  Primary Physician: Dwight Trula SQUIBB, MD Primary Cardiologist: None  Primary Electrophysiologist: New to Dr. Cindie Reason for Consultation: Cryptogenic stroke; recommendations regarding Implantable Loop Recorder Insurance: Medicare  History of Present Illness EP has been asked to evaluate Kristy [name] for placement of an implantable loop recorder to monitor for atrial fibrillation by Dr Rosemarie.  The patient was admitted on 10/02/2023 with left arm and leg heaviness. She was given TNK in the ER with improvement in her symptoms. , found .    MRI Imaging demonstrated large right basal ganglia infarct.    She has undergone workup for stroke including:  TTE - EF 60 to 65%, mild LVH, grade 1 diastolic dysfunction, normal left atrial size, no atrial level shunt CTA head & neck no LVO, 6 to 7 mm segment of moderate right MCA M1 irregularity and stenosis, calcified atherosclerosis of both ICA siphons MRI acute infarct involving posterior aspect of right putamen, posterior aspect of the right caudate nucleus and adjacent white matter with associated petechial hemorrhage and right carotid body, punctate foci of restricted diffusion within right frontal white matter and right postcentral gyrus, chronic microvascular ischemic changes Lab work    The patient has been monitored on telemetry which has demonstrated sinus rhythm with no arrhythmias.  Inpatient stroke work-up will not require a TEE per Neurology.   Echocardiogram as above. Lab work is reviewed.  Prior to admission, the patient denies chest pain, shortness of breath, dizziness, palpitations, or syncope.  She is recovering from her stroke with plans to return home  at discharge.  Allergies, Past Medical, Surgical, Social, and Family Histories have been reviewed and are referenced here-in  when relevant for medical decision making.   Inpatient Medications:   aspirin   81 mg Oral Daily   clopidogrel   75 mg Oral Daily   lidocaine -EPINEPHrine        rosuvastatin   20 mg Oral Daily    Physical Exam: Vitals:   10/04/23 0058 10/04/23 0059 10/04/23 0402 10/04/23 0823  BP: 136/87 136/87 126/67 126/75  Pulse: 78 78 72 82  Resp: 16 15 18 16   Temp: 97.7 F (36.5 C) 97.7 F (36.5 C) 98.5 F (36.9 C) 98.2 F (36.8 C)  TempSrc: Oral Oral Oral Oral  SpO2: 100% 100% 100% 97%  Weight:      Height:        GEN- NAD. A&O x 3. Normal affect. HEENT: Normocephalic, atraumatic Lungs- CTAB, Normal effort.  Heart- Regular rate and rhythm rate and rhythm. No M/G/R.  Extremities- No peripheral edema. no clubbing or cyanosis Skin- warm and dry, no rash or lesion. Neuro alert and oriented   12-lead ECG  (personally reviewed) All prior EKG's in EPIC reviewed with no documented atrial fibrillation  Telemetry Sinus, no afib (personally reviewed)  Assessment and Plan:  1. Cryptogenic stroke The patient presents with cryptogenic stroke.  The patient does not have a TEE planned for this AM.  I spoke at length with the patient about monitoring for afib with an implantable loop recorder.  Risks, benefits, and alteratives to implantable loop recorder were discussed with the patient today.   At this time, the patient is very clear in their decision to proceed with implantable loop recorder.     Wound care was reviewed with the patient (keep incision clean and dry for 3 days). Please call  with questions.     Ozell Prentice Passey, PA-C 10/04/2023 1:45 PM

## 2023-10-04 NOTE — Discharge Instructions (Addendum)
 Kristy [name], you were admitted to the hospital with heaviness and weakness of the left arm and leg due to a stroke.  You were given TNK to treat this with improvement in your symptoms.  You will need to take aspirin  and Plavix  for three weeks followed by aspirin  alone.  You will need to follow up with cardiology after your loop recorder placement today.  Please be seen in the stroke clinic 8 weeks after discharge.   Care After Your Loop Recorder  You have a Med[employer]onic Loop Recorder   Monitor your cardiac device site for redness, swelling, and drainage. Call the device clinic at [phone] if you experience these symptoms or fever/chills.  If you notice bleeding from your site, hold firm, but gently pressure with two fingers for 5 minutes. Dried blood on the steri-strips when removing the outer bandage is normal.   Keep the large square bandage on your site for 24 hours and then you may remove it yourself. Keep the steri-strips underneath in place.   You may shower after 72 hours / 3 days from your procedure with the steri-strips in place. They will usually fall off on their own, or may be removed after 10 days. Pat dry.   Avoid lotions, ointments, or perfumes over your incision until it is well-healed.  Please do not submerge in water until your site is completely healed.   Your device is MRI compatible.   Remote monitoring is used to monitor your cardiac device from home. This monitoring is scheduled every month by our office. It allows us  to keep an eye on the function of your device to ensure it is working properly.

## 2023-10-04 NOTE — Progress Notes (Signed)
 Physical Therapy Treatment Patient Details Name: Kristy [name] MRN: [id number] DOB: [birth year] Today's Date: 10/04/2023   History of Present Illness 78 yo female admitted 10/02/23 with L side weakness and MR (+) posterior aspect of R putamen posterior body of R caudate nucleus and adjacent white matter associated petechial hemorrhage within the right caudate body, other punctate foci of restricted diffusion within the right frontal white matter and right postcentral gyrus, consistent with acute infarcts.  Recieved TNK.  PMH HLD HTN    PT Comments  Pt resting in bed on arrival and agreeable to session with continued progress towards acute goals. Pt able to progress gait distance without AD and CGA for safety without overt LOB. Pt agreeable to trial [facility] for a few feet however pt saying she disliked it. Pt demonstrating gait with RW support in room at end of session at supervision level with noted improved in gait quality and stability. Discussed PRN RW use post acutely with pt verbalizing understanding. Pt continues to benefit from skilled PT services to progress toward functional mobility goals.     If plan is discharge home, recommend the following: Help with stairs or ramp for entrance;A little help with walking and/or transfers;Assist for transportation;A little help with bathing/dressing/bathroom   Can travel by private vehicle        Equipment Recommendations       Recommendations for Other Services       Precautions / Restrictions Precautions Precautions: Fall Restrictions Weight Bearing Restrictions Per Provider Order: No     Mobility  Bed Mobility Overal bed mobility: Modified Independent                  Transfers Overall transfer level: Needs assistance Equipment used: None Transfers: Sit to/from Stand Sit to Stand: Supervision           General transfer comment: supervision for safety    Ambulation/Gait Ambulation/Gait assistance: Contact guard assist,  Supervision Gait Distance (Feet): 336 Feet (+40') Assistive device: None, Rolling walker (2 wheels) Gait Pattern/deviations: Step-through pattern, Decreased stride length Gait velocity: decr     General Gait Details: still with moments of feeling wobbly on her feet, though feeling better than initially, no overt LOB, x1 minimal mistep with pt able to self correct, pt trialing cane for a few feet but verbalizing she did not like it, supervision with RW for 40' in room at end of session with pt stating she has one at home and will utilize PRN   Stairs             Wheelchair Mobility     Tilt Bed    Modified Rankin (Stroke Patients Only) Modified Rankin (Stroke Patients Only) Pre-Morbid Rankin Score: No symptoms Modified Rankin: Moderate disability     Balance Overall balance assessment: Needs assistance   Sitting balance-Leahy Scale: Good     Standing balance support: No upper extremity supported Standing balance-Leahy Scale: Fair                              Cognition Arousal: Alert Behavior During Therapy: WFL for tasks assessed/performed Overall Cognitive Status: Within Functional Limits for tasks assessed                                 General Comments: pleasant and motivated        Exercises  General Comments General comments (skin integrity, edema, etc.): VSS, continues discussion on DME for gait      Pertinent Vitals/Pain Pain Assessment Pain Assessment: No/denies pain    Home Living                          Prior Function            PT Goals (current goals can now be found in the care plan section) Acute Rehab PT Goals Patient Stated Goal: return to independent PT Goal Formulation: With patient Time For Goal Achievement: 10/17/23 Progress towards PT goals: Progressing toward goals    Frequency    Min 1X/week      PT Plan      Co-evaluation              AM-PAC PT 6 Clicks  Mobility   Outcome Measure  Help needed turning from your back to your side while in a flat bed without using bedrails?: None Help needed moving from lying on your back to sitting on the side of a flat bed without using bedrails?: None Help needed moving to and from a bed to a chair (including a wheelchair)?: A Little Help needed standing up from a chair using your arms (e.g., wheelchair or bedside chair)?: A Little Help needed to walk in hospital room?: A Little Help needed climbing 3-5 steps with a railing? : A Lot 6 Click Score: 19    End of Session Equipment Utilized During Treatment: Gait belt Activity Tolerance: Patient tolerated treatment well Patient left: in bed;with call bell/phone within reach;with family/visitor present Nurse Communication: Mobility status PT Visit Diagnosis: Other abnormalities of gait and mobility (R26.89);Muscle weakness (generalized) (M62.81)     Time: 8651-8640 PT Time Calculation (min) (ACUTE ONLY): 11 min  Charges:    $Gait Training: 8-22 mins PT General Charges $$ ACUTE PT VISIT: 1 Visit                     Veer Elamin R. PTA Acute Rehabilitation Services Office: [phone]   Therisa CHRISTELLA Boor 10/04/2023, 3:25 PM

## 2023-10-04 NOTE — Plan of Care (Signed)
  Problem: Education: Goal: Knowledge of disease or condition will improve Outcome: Progressing   Problem: Ischemic Stroke/TIA Tissue Perfusion: Goal: Complications of ischemic stroke/TIA will be minimized Outcome: Progressing   Problem: Coping: Goal: Will verbalize positive feelings about self Outcome: Progressing Goal: Will identify appropriate support needs Outcome: Progressing   Problem: Health Behavior/Discharge Planning: Goal: Ability to manage health-related needs will improve Outcome: Progressing Goal: Goals will be collaboratively established with patient/family Outcome: Progressing   Problem: Self-Care: Goal: Ability to participate in self-care as condition permits will improve Outcome: Progressing Goal: Verbalization of feelings and concerns over difficulty with self-care will improve Outcome: Progressing Goal: Ability to communicate needs accurately will improve Outcome: Progressing   Problem: Nutrition: Goal: Risk of aspiration will decrease Outcome: Progressing Goal: Dietary intake will improve Outcome: Progressing   Problem: Education: Goal: Knowledge of General Education information will improve Description: Including pain rating scale, medication(s)/side effects and non-pharmacologic comfort measures Outcome: Progressing   Problem: Health Behavior/Discharge Planning: Goal: Ability to manage health-related needs will improve Outcome: Progressing   Problem: Clinical Measurements: Goal: Ability to maintain clinical measurements within normal limits will improve Outcome: Progressing Goal: Will remain free from infection Outcome: Progressing Goal: Diagnostic test results will improve Outcome: Progressing Goal: Respiratory complications will improve Outcome: Progressing Goal: Cardiovascular complication will be avoided Outcome: Progressing   Problem: Activity: Goal: Risk for activity intolerance will decrease Outcome: Progressing   Problem:  Nutrition: Goal: Adequate nutrition will be maintained Outcome: Progressing   Problem: Coping: Goal: Level of anxiety will decrease Outcome: Progressing   Problem: Elimination: Goal: Will not experience complications related to bowel motility Outcome: Progressing Goal: Will not experience complications related to urinary retention Outcome: Progressing   Problem: Pain Management: Goal: General experience of comfort will improve Outcome: Progressing   Problem: Safety: Goal: Ability to remain free from injury will improve Outcome: Progressing   Problem: Skin Integrity: Goal: Risk for impaired skin integrity will decrease Outcome: Progressing

## 2023-10-04 NOTE — Plan of Care (Signed)
 Problem: Education: Goal: Knowledge of disease or condition will improve 10/04/2023 1413 by Johnie Will HERO, RN Outcome: Adequate for Discharge 10/04/2023 1051 by Johnie Will HERO, RN Outcome: Progressing Goal: Knowledge of secondary prevention will improve (MUST DOCUMENT ALL) 10/04/2023 1413 by Johnie Will HERO, RN Outcome: Adequate for Discharge 10/04/2023 1051 by Johnie Will HERO, RN Outcome: Progressing Goal: Knowledge of patient specific risk factors will improve Alonso N/A or DELETE if not current risk factor) 10/04/2023 1413 by Johnie Will HERO, RN Outcome: Adequate for Discharge 10/04/2023 1051 by Johnie Will HERO, RN Outcome: Progressing   Problem: Ischemic Stroke/TIA Tissue Perfusion: Goal: Complications of ischemic stroke/TIA will be minimized 10/04/2023 1413 by Johnie Will HERO, RN Outcome: Adequate for Discharge 10/04/2023 1051 by Johnie Will HERO, RN Outcome: Progressing   Problem: Coping: Goal: Will verbalize positive feelings about self 10/04/2023 1413 by Johnie Will HERO, RN Outcome: Adequate for Discharge 10/04/2023 1051 by Johnie Will HERO, RN Outcome: Progressing Goal: Will identify appropriate support needs 10/04/2023 1413 by Johnie Will HERO, RN Outcome: Adequate for Discharge 10/04/2023 1051 by Johnie Will HERO, RN Outcome: Progressing   Problem: Health Behavior/Discharge Planning: Goal: Ability to manage health-related needs will improve 10/04/2023 1413 by Johnie Will HERO, RN Outcome: Adequate for Discharge 10/04/2023 1051 by Johnie Will HERO, RN Outcome: Progressing Goal: Goals will be collaboratively established with patient/family 10/04/2023 1413 by Johnie Will HERO, RN Outcome: Adequate for Discharge 10/04/2023 1051 by Johnie Will HERO, RN Outcome: Progressing   Problem: Self-Care: Goal: Ability to participate in self-care as condition permits will improve 10/04/2023 1413 by Johnie Will HERO,  RN Outcome: Adequate for Discharge 10/04/2023 1051 by Johnie Will HERO, RN Outcome: Progressing Goal: Verbalization of feelings and concerns over difficulty with self-care will improve 10/04/2023 1413 by Johnie Will HERO, RN Outcome: Adequate for Discharge 10/04/2023 1051 by Johnie Will HERO, RN Outcome: Progressing Goal: Ability to communicate needs accurately will improve 10/04/2023 1413 by Johnie Will HERO, RN Outcome: Adequate for Discharge 10/04/2023 1051 by Johnie Will HERO, RN Outcome: Progressing   Problem: Nutrition: Goal: Risk of aspiration will decrease 10/04/2023 1413 by Johnie Will HERO, RN Outcome: Adequate for Discharge 10/04/2023 1051 by Johnie Will HERO, RN Outcome: Progressing Goal: Dietary intake will improve 10/04/2023 1413 by Johnie Will HERO, RN Outcome: Adequate for Discharge 10/04/2023 1051 by Johnie Will HERO, RN Outcome: Progressing   Problem: Education: Goal: Knowledge of General Education information will improve Description: Including pain rating scale, medication(s)/side effects and non-pharmacologic comfort measures 10/04/2023 1413 by Johnie Will HERO, RN Outcome: Adequate for Discharge 10/04/2023 1051 by Johnie Will HERO, RN Outcome: Progressing   Problem: Health Behavior/Discharge Planning: Goal: Ability to manage health-related needs will improve 10/04/2023 1413 by Johnie Will HERO, RN Outcome: Adequate for Discharge 10/04/2023 1051 by Johnie Will HERO, RN Outcome: Progressing   Problem: Clinical Measurements: Goal: Ability to maintain clinical measurements within normal limits will improve 10/04/2023 1413 by Johnie Will HERO, RN Outcome: Adequate for Discharge 10/04/2023 1051 by Johnie Will HERO, RN Outcome: Progressing Goal: Will remain free from infection 10/04/2023 1413 by Johnie Will HERO, RN Outcome: Adequate for Discharge 10/04/2023 1051 by Johnie Will HERO, RN Outcome: Progressing Goal:  Diagnostic test results will improve 10/04/2023 1413 by Johnie Will HERO, RN Outcome: Adequate for Discharge 10/04/2023 1051 by Johnie Will HERO, RN Outcome: Progressing Goal: Respiratory complications will improve 10/04/2023 1413 by Johnie Will HERO, RN Outcome: Adequate for Discharge 10/04/2023 1051 by Johnie Will HERO, RN Outcome: Progressing Goal: Cardiovascular complication will be avoided  10/04/2023 1413 by Johnie Will HERO, RN Outcome: Adequate for Discharge 10/04/2023 1051 by Johnie Will HERO, RN Outcome: Progressing   Problem: Activity: Goal: Risk for activity intolerance will decrease 10/04/2023 1413 by Johnie Will HERO, RN Outcome: Adequate for Discharge 10/04/2023 1051 by Johnie Will HERO, RN Outcome: Progressing   Problem: Nutrition: Goal: Adequate nutrition will be maintained 10/04/2023 1413 by Johnie Will HERO, RN Outcome: Adequate for Discharge 10/04/2023 1051 by Johnie Will HERO, RN Outcome: Progressing   Problem: Coping: Goal: Level of anxiety will decrease 10/04/2023 1413 by Johnie Will HERO, RN Outcome: Adequate for Discharge 10/04/2023 1051 by Johnie Will HERO, RN Outcome: Progressing   Problem: Elimination: Goal: Will not experience complications related to bowel motility 10/04/2023 1413 by Johnie Will HERO, RN Outcome: Adequate for Discharge 10/04/2023 1051 by Johnie Will HERO, RN Outcome: Progressing Goal: Will not experience complications related to urinary retention 10/04/2023 1413 by Johnie Will HERO, RN Outcome: Adequate for Discharge 10/04/2023 1051 by Johnie Will HERO, RN Outcome: Progressing   Problem: Pain Management: Goal: General experience of comfort will improve 10/04/2023 1413 by Johnie Will HERO, RN Outcome: Adequate for Discharge 10/04/2023 1051 by Johnie Will HERO, RN Outcome: Progressing   Problem: Safety: Goal: Ability to remain free from injury will improve 10/04/2023 1413 by  Johnie Will HERO, RN Outcome: Adequate for Discharge 10/04/2023 1051 by Johnie Will HERO, RN Outcome: Progressing   Problem: Skin Integrity: Goal: Risk for impaired skin integrity will decrease 10/04/2023 1413 by Johnie Will HERO, RN Outcome: Adequate for Discharge 10/04/2023 1051 by Johnie Will HERO, RN Outcome: Progressing   Problem: Acute Rehab OT Goals (only OT should resolve) Goal: Pt. Will Transfer To Toilet Outcome: Adequate for Discharge Goal: Pt. Will Perform Tub/Shower Transfer Outcome: Adequate for Discharge Goal: OT Additional ADL Goal #1 Outcome: Adequate for Discharge

## 2023-10-04 NOTE — Progress Notes (Addendum)
 Called pt's daughter, Zella Ball, to inform her of tx to 3W22.  Daughter answered phone and is aware.

## 2023-10-04 NOTE — Plan of Care (Signed)

## 2023-10-04 NOTE — TOC Transition Note (Signed)
 Transition of Care [facility]) - Discharge Note   Patient Details  Name: Kristy [name] MRN: [id number] Date of Birth: April 10, 1946  Transition of [facility] CM/SW Contact:  Andrez JULIANNA George, RN Phone Number: 10/04/2023, 2:39 PM   Clinical Narrative:     Pt lives with her son at home. When he is at work she will have other family with her.  She has walker at home. Outpatient therapy arranged through [facility]. Information on the AVS. Pt will call to schedule the first appointment. Pt has transportation home.  Final next level of care: OP Rehab Barriers to Discharge: No Barriers Identified   Patient Goals and CMS Choice     Choice offered to / list presented to : Patient      Discharge Placement                       Discharge Plan and Services Additional resources added to the After Visit Summary for                                       Social Drivers of Health (SDOH) Interventions SDOH Screenings   Food Insecurity: No Food Insecurity (10/02/2023)  Housing: Unknown (10/02/2023)  Transportation Needs: No Transportation Needs (10/02/2023)  Utilities: Not At Risk (10/02/2023)  Alcohol Screen: Low Risk  (03/27/2019)  Depression (PHQ2-9): Low Risk  (12/26/2019)  Financial Resource Strain: Low Risk  (08/05/2017)  Physical Activity: Inactive (08/05/2017)  Social Connections: Moderately Isolated (10/02/2023)  Stress: No Stress Concern Present (08/05/2017)  Tobacco Use: Low Risk  (12/26/2019)     Readmission Risk Interventions     No data to display

## 2023-10-05 ENCOUNTER — Encounter (HOSPITAL_COMMUNITY): Payer: Self-pay | Admitting: Student

## 2023-10-05 MED FILL — Lidocaine Inj 1% w/ Epinephrine-1:100000: INTRAMUSCULAR | Qty: 20 | Status: AC

## 2023-10-10 ENCOUNTER — Encounter: Payer: Self-pay | Admitting: Physical Therapy

## 2023-10-10 ENCOUNTER — Ambulatory Visit: Payer: Medicare Other | Admitting: Occupational Therapy

## 2023-10-10 ENCOUNTER — Ambulatory Visit: Payer: Medicare Other | Attending: Neurology | Admitting: Physical Therapy

## 2023-10-10 VITALS — BP 137/76 | HR 80

## 2023-10-10 DIAGNOSIS — R2681 Unsteadiness on feet: Secondary | ICD-10-CM | POA: Insufficient documentation

## 2023-10-10 DIAGNOSIS — R2689 Other abnormalities of gait and mobility: Secondary | ICD-10-CM | POA: Insufficient documentation

## 2023-10-10 DIAGNOSIS — I639 Cerebral infarction, unspecified: Secondary | ICD-10-CM | POA: Insufficient documentation

## 2023-10-10 DIAGNOSIS — M6281 Muscle weakness (generalized): Secondary | ICD-10-CM | POA: Diagnosis not present

## 2023-10-10 NOTE — Therapy (Signed)
OUTPATIENT PHYSICAL THERAPY NEURO EVALUATION   Patient Name: Kristy [name] MRN: [id number] DOB:May 04, 1946, 78 y.o., female Today's Date: 10/10/2023   PCP: Thana Ates, MD  REFERRING PROVIDER: Micki Riley, MD    END OF SESSION:  PT End of Session - 10/10/23 1049     Visit Number 1    Number of Visits 9    Date for PT Re-Evaluation 12/09/23    Authorization Type MEDICARE PART A AND B    PT Start Time 1047    PT Stop Time 1127    PT Time Calculation (min) 40 min    Equipment Utilized During Treatment Gait belt    Activity Tolerance Patient tolerated treatment well    Behavior During Therapy WFL for tasks assessed/performed             Past Medical History:  Diagnosis Date   Allergy    Anemia    Hyperlipidemia    Hypertension    Past Surgical History:  Procedure Laterality Date   ABDOMINAL HYSTERECTOMY     LOOP RECORDER INSERTION N/A 10/04/2023   Procedure: LOOP RECORDER INSERTION;  Surgeon: Graciella Freer, PA-C;  Location: [facility] INVASIVE CV LAB;  Service: Cardiovascular;  Laterality: N/A;   PARTIAL HYSTERECTOMY     TUBAL LIGATION     Patient Active Problem List   Diagnosis Date Noted   Acute ischemic stroke (HCC) 10/02/2023   Screening for colon cancer 08/31/2018   Encounter for medication monitoring 08/31/2018   Dyslipidemia 08/31/2018   Dizziness 08/31/2018   Essential hypertension 08/31/2018   Tinnitus 08/05/2017   Paresthesia 08/05/2017   History of irregular heartbeat 08/05/2017   Allergic rhinitis 05/16/2014   Sleep pattern disturbance 11/23/2013   Dyspepsia 05/30/2013   Hypercholesteremia 04/05/2012    ONSET DATE: 10/04/2023  REFERRING DIAG: I63.9 (ICD-10-CM) - Acute ischemic stroke (HCC)  THERAPY DIAG:  Unsteadiness on feet  Other abnormalities of gait and mobility  Muscle weakness (generalized)  Rationale for Evaluation and Treatment: Rehabilitation  SUBJECTIVE:                                                                                                                                                                                              SUBJECTIVE STATEMENT: Reports she does not have her balance on her L side. L leg always feels like its gonna give way. No falls. Just walking from point to point will make her feel off balance. Uses a RW at home and brings it in today. Tried a cane in the hospital and did not feel good using this. Prior to CVA, was using no  AD. Have not yet showered yet, is nervous about showering. Has been washing herself in the sink.   Pt accompanied by: family member and Daughter, Zella Ball   PERTINENT HISTORY: Pt admitted 10/02/23 with L side weakness. Pt with Acute Ischemic Infarct: Small acute infarcts in the posterior aspect of the right putamen, posterior aspect of the right caudate nucleus and adjacent white matter s/p TNK. Etiology: Likely embolic from cryptogenic source given large size of the subcortical infarct. Loop record implanted  Was discharged home from the hospital on 10/04/23.    PMH HLD HTN   PAIN:  Are you having pain? No  Vitals:   10/10/23 1058  BP: 137/76  Pulse: 80     PRECAUTIONS: Fall  WEIGHT BEARING RESTRICTIONS: No  FALLS: Has patient fallen in last 6 months? No  LIVING ENVIRONMENT: Lives with: lives with their son Lives in: House/apartment Stairs: Yes: External: 4 steps; on right going up Has following equipment at home:  Walker with no wheels   PLOF: Independent and Leisure: Enjoys reading   PATIENT GOALS: Wants to improve balance on her L side   OBJECTIVE:  Note: Objective measures were completed at Evaluation unless otherwise noted.  DIAGNOSTIC FINDINGS: MRI brain 10/03/23: IMPRESSION: 1. Acute infarct involving the posterior aspect of the right putamen, posterior aspect of the body of the right caudate nucleus and adjacent white matter with associated petechial hemorrhage within the right caudate body. 2. Other punctate foci of  restricted diffusion within the right frontal white matter and right postcentral gyrus, consistent with acute infarcts. 3. Scattered and confluent foci of T2 hyperintensity within the white matter of the cerebral hemispheres, nonspecific but may represent chronic microvascular ischemic changes.  COGNITION: Overall cognitive status: Within functional limits for tasks assessed   SENSATION: Light touch: WFL  Reports some slight tingling in R and L toes   COORDINATION: Heel to shin: Slightly slower with LLE   POSTURE: No Significant postural limitations   LOWER EXTREMITY MMT:    MMT Right Eval Left Eval  Hip flexion 5 4-  Hip extension    Hip abduction 5 4+  Hip adduction 5 5  Hip internal rotation    Hip external rotation    Knee flexion 5 4-  Knee extension 5 4-  Ankle dorsiflexion 4+ 4+  Ankle plantarflexion    Ankle inversion    Ankle eversion    (Blank rows = not tested)  All tested in sitting   BED MOBILITY:  Pt reports since CVA it is slower, but not difficult  TRANSFERS: Assistive device utilized: None  Sit to stand: SBA Stand to sit: SBA Performed with no UE support, equal weight bearing through BLE  STAIRS: Level of Assistance: SBA Stair Negotiation Technique: Step to Pattern Forwards with Bilateral Rails Number of Stairs: 4  Height of Stairs: 6"  Comments: Prior to CVA, pt did not have to use a handrail   GAIT: Gait pattern: decreased step length- Right, decreased stance time- Left, decreased stride length, and decreased hip/knee flexion- Left, unsteady gait  Distance walked: Clinic distances  Assistive device utilized: None Level of assistance: CGA Comments: Close CGA with no AD due to unsteadiness  Pt ambulating in and out of clinic with walker (no wheels) with mod I   FUNCTIONAL TESTS:  5 times sit to stand: 18.9 seconds with no UE support  Timed up and go (TUG): 13 seconds with no AD with CGA 10 meter walk test: 17.85 seconds with no  AD  and CGA = 1.84 ft/sec     [facility] PT Assessment - 10/10/23 1119       Functional Gait  Assessment   Gait assessed  Yes    Gait Level Surface Walks 20 ft, slow speed, abnormal gait pattern, evidence for imbalance or deviates 10-15 in outside of the 12 in walkway width. Requires more than 7 sec to ambulate 20 ft.   10.3   Change in Gait Speed Makes only minor adjustments to walking speed, or accomplishes a change in speed with significant gait deviations, deviates 10-15 in outside the 12 in walkway width, or changes speed but loses balance but is able to recover and continue walking.    Gait with Horizontal Head Turns Performs head turns smoothly with slight change in gait velocity (eg, minor disruption to smooth gait path), deviates 6-10 in outside 12 in walkway width, or uses an assistive device.    Gait with Vertical Head Turns Performs task with moderate change in gait velocity, slows down, deviates 10-15 in outside 12 in walkway width but recovers, can continue to walk.    Gait and Pivot Turn Pivot turns safely in greater than 3 sec and stops with no loss of balance, or pivot turns safely within 3 sec and stops with mild imbalance, requires small steps to catch balance.    Step Over Obstacle Is able to step over one shoe box (4.5 in total height) but must slow down and adjust steps to clear box safely. May require verbal cueing.    Gait with Narrow Base of Support Ambulates less than 4 steps heel to toe or cannot perform without assistance.    Gait with Eyes Closed Walks 20 ft, slow speed, abnormal gait pattern, evidence for imbalance, deviates 10-15 in outside 12 in walkway width. Requires more than 9 sec to ambulate 20 ft.   17.7   Ambulating Backwards Walks 20 ft, slow speed, abnormal gait pattern, evidence for imbalance, deviates 10-15 in outside 12 in walkway width.   33 seconds   Steps Two feet to a stair, must use rail.    Total Score 11    FGA comment: 11/30                                                                                                                                           TREATMENT DATE:  N/A during eval     PATIENT EDUCATION: Education details: Clinical findings, POC, continue to use walker at all times due to fall risk, educated to ambulate more at home with walker instead of just to the kitchen or bathroom, can work on sit <> stands at home with walker in front of pt as needed for balance.  Person educated: Patient and Child(ren) Education method: Explanation Education comprehension: verbalized understanding  HOME EXERCISE PROGRAM: Verbally added sit <> stands to HEP Will provide thorough HEP at next  session  GOALS: Goals reviewed with patient? Yes  SHORT TERM GOALS: Target date: 11/07/2023  Pt will be independent with initial HEP in order to build upon functional gains made in therapy. Baseline: Goal status: INITIAL  2.  Pt will improve FGA to at least a 15/30 in order to demo decr fall risk.  Baseline: 11/30 Goal status: INITIAL  3.  Pt will improve gait speed with no AD vs. LRAD to at least 2.2 ft/sec in order to demo improved community mobility.   Baseline: 17.85 seconds with no AD and CGA = 1.84 ft/sec  Goal status: INITIAL  4.  Pt will ambulate at least 38' with no AD with supervision in order to demo improved functional mobility.  Baseline: currently needs CGA with no AD  Goal status: INITIAL    LONG TERM GOALS: Target date: 12/05/2023  Pt will be independent with final HEP in order to build upon functional gains made in therapy. Baseline:  Goal status: INITIAL  2.  Pt will improve FGA to at least a 21/30 in order to demo decr fall risk. Baseline: 11/30 Goal status: INITIAL  3.   Pt will improve gait speed with no AD vs. LRAD to at least 2.7 ft/sec in order to demo improved community mobility.  Baseline: 17.85 seconds with no AD and CGA = 1.84 ft/sec  Goal status: INITIAL  4.  Pt will improve 5x sit<>stand  to less than or equal to 14 sec with no UE support to demonstrate improved functional strength and transfer efficiency.   Baseline: 18.9 seconds with no UE support Goal status: INITIAL  5.  Pt will improve TUG time to 10 seconds or less with supervision and no AD in order to demo decrease fall risk. Baseline: 13 seconds and CGA Goal status: INITIAL   ASSESSMENT:  CLINICAL IMPRESSION: Patient is a 78 year old female referred to Neuro OPPT for acute CVA.  Pt admitted 10/02/23 with L side weakness. Pt with Acute Ischemic Infarct: Small acute infarcts in the posterior aspect of the right putamen, posterior aspect of the right caudate nucleus and adjacent white matter s/p TNK.  Pt's PMH is significant for:  HLD HTN . The following deficits were present during the exam: impaired balance, decr strength, gait abnormality, decr activity tolerance. Based on TUG, 5x sit <> stand and FGA, pt is an incr risk for falls. Based on gait speed with no AD, pt is at a fall risk/limited community ambulator. Educated to make sure pt is using walker at all times at home. Pt would benefit from skilled PT to address these impairments and functional limitations to maximize functional mobility independence and decr fall risk.    OBJECTIVE IMPAIRMENTS: Abnormal gait, decreased activity tolerance, decreased balance, decreased coordination, difficulty walking, and decreased strength.   ACTIVITY LIMITATIONS: carrying, lifting, standing, stairs, transfers, bathing, and locomotion level  PARTICIPATION LIMITATIONS: meal prep, driving, shopping, and community activity  PERSONAL FACTORS: Age, Past/current experiences, Time since onset of injury/illness/exacerbation, Transportation, and 1-2 comorbidities:  HLD HTN   are also affecting patient's functional outcome.   REHAB POTENTIAL: Good  CLINICAL DECISION MAKING: Stable/uncomplicated  EVALUATION COMPLEXITY: Low  PLAN:  PT FREQUENCY: 1x/week - due to family  preference/transportation  PT DURATION: 8 weeks  PLANNED INTERVENTIONS: 97164- PT Re-evaluation, 97110-Therapeutic exercises, 97530- Therapeutic activity, [id number]- Neuromuscular re-education, 97535- Self Care, [id number]- Manual therapy, [phone]- Gait training, Patient/Family education, Balance training, Stair training, and DME instructions  PLAN FOR NEXT SESSION: initiate HEP for LLE strength,  standing balance. Work on balance and strength tasks, gait with no AD    Drake Leach, PT, DPT 10/10/2023, 11:36 AM

## 2023-10-17 ENCOUNTER — Telehealth: Payer: Self-pay | Admitting: *Deleted

## 2023-10-17 ENCOUNTER — Telehealth: Payer: Self-pay

## 2023-10-17 DIAGNOSIS — Z8673 Personal history of transient ischemic attack (TIA), and cerebral infarction without residual deficits: Secondary | ICD-10-CM | POA: Diagnosis not present

## 2023-10-17 DIAGNOSIS — G2581 Restless legs syndrome: Secondary | ICD-10-CM | POA: Diagnosis not present

## 2023-10-17 DIAGNOSIS — I10 Essential (primary) hypertension: Secondary | ICD-10-CM | POA: Diagnosis not present

## 2023-10-17 DIAGNOSIS — Z79899 Other long term (current) drug therapy: Secondary | ICD-10-CM | POA: Diagnosis not present

## 2023-10-17 DIAGNOSIS — I639 Cerebral infarction, unspecified: Secondary | ICD-10-CM

## 2023-10-17 NOTE — Patient Outreach (Signed)
  Emmi Stroke Care Coordination Follow Up  10/17/2023 Name:  Kristy [name] MRN:  [id number] DOB:  1946-04-26  Subjective: Kristy [name] is a 78 y.o. year old female who is a primary care patient of Thana Ates, MD An Emmi alert was received indicating patient responded to questions: Sad, hopeless, anxious, or empty?. I reached out by phone to follow up on the alert and spoke to Patient.   Patient states she is feeling well, denies depression, not sure if she answered the question on the EMMI call correctly.  Care Coordination Interventions:  Yes, provided   Interventions Today    Flowsheet Row Most Recent Value  Chronic Disease   Chronic disease during today's visit Other  [CVA]  General Interventions   General Interventions Discussed/Reviewed General Interventions Discussed  [Reviewed signs/ symptoms CVA, reportable signs/ symptoms]  Education Interventions   Education Provided Provided Education  Provided Verbal Education On Mental Health/Coping with Illness  Mental Health Interventions   Mental Health Discussed/Reviewed Other  [pt denies any depression, screening completed]  Safety Interventions   Safety Discussed/Reviewed Safety Discussed       Follow up plan: No further intervention required.   Encounter Outcome:  Patient Visit Completed   Irving Shows [facility], BSN RN Care Manager/ Transition of Care [place]/ [facility] [phone]

## 2023-10-20 ENCOUNTER — Ambulatory Visit: Payer: Medicare Other | Admitting: Physical Therapy

## 2023-10-20 ENCOUNTER — Encounter: Payer: Self-pay | Admitting: Physical Therapy

## 2023-10-20 VITALS — BP 142/76 | HR 82

## 2023-10-20 DIAGNOSIS — R2689 Other abnormalities of gait and mobility: Secondary | ICD-10-CM

## 2023-10-20 DIAGNOSIS — R2681 Unsteadiness on feet: Secondary | ICD-10-CM

## 2023-10-20 DIAGNOSIS — M6281 Muscle weakness (generalized): Secondary | ICD-10-CM | POA: Diagnosis not present

## 2023-10-20 DIAGNOSIS — I639 Cerebral infarction, unspecified: Secondary | ICD-10-CM | POA: Diagnosis not present

## 2023-10-20 NOTE — Therapy (Signed)
OUTPATIENT PHYSICAL THERAPY NEURO TREATMENT   Patient Name: Kristy [name] MRN: [id number] DOB:[birth year], 78 y.o., female Today's Date: 10/20/2023   PCP: Thana Ates, MD  REFERRING PROVIDER: Micki Riley, MD    END OF SESSION:  PT End of Session - 10/20/23 1527     Visit Number 2    Number of Visits 9    Date for PT Re-Evaluation 12/09/23    Authorization Type MEDICARE PART A AND B    PT Start Time 1525    PT Stop Time 1605    PT Time Calculation (min) 40 min    Equipment Utilized During Treatment Gait belt    Activity Tolerance Patient tolerated treatment well    Behavior During Therapy WFL for tasks assessed/performed              Past Medical History:  Diagnosis Date   Allergy    Anemia    Hyperlipidemia    Hypertension    Past Surgical History:  Procedure Laterality Date   ABDOMINAL HYSTERECTOMY     LOOP RECORDER INSERTION N/A 10/04/2023   Procedure: LOOP RECORDER INSERTION;  Surgeon: Graciella Freer, PA-C;  Location: [facility];  Service: Cardiovascular;  Laterality: N/A;   PARTIAL HYSTERECTOMY     TUBAL LIGATION     Patient Active Problem List   Diagnosis Date Noted   Acute ischemic stroke (HCC) 10/02/2023   Screening for colon cancer 08/31/2018   Encounter for medication monitoring 08/31/2018   Dyslipidemia 08/31/2018   Dizziness 08/31/2018   Essential hypertension 08/31/2018   Tinnitus 08/05/2017   Paresthesia 08/05/2017   History of irregular heartbeat 08/05/2017   Allergic rhinitis 05/16/2014   Sleep pattern disturbance 11/23/2013   Dyspepsia 05/30/2013   Hypercholesteremia 04/05/2012    ONSET DATE: 10/04/2023  REFERRING DIAG: I63.9 (ICD-10-CM) - Acute ischemic stroke (HCC)  THERAPY DIAG:  Unsteadiness on feet  Other abnormalities of gait and mobility  Muscle weakness (generalized)  Rationale for Evaluation and Treatment: Rehabilitation  SUBJECTIVE:                                                                                                                                                                                              SUBJECTIVE STATEMENT: No falls, has been walking around with her walker at home.   Pt accompanied by: family member and son in waiting room   PERTINENT HISTORY: Pt admitted 10/02/23 with L side weakness. Pt with Acute Ischemic Infarct: Small acute infarcts in the posterior aspect of the right putamen, posterior aspect of the right caudate nucleus and adjacent white matter s/p  TNK. Etiology: Likely embolic from cryptogenic source given large size of the subcortical infarct. Loop record implanted  Was discharged home from the hospital on 10/04/23.    PMH HLD HTN   PAIN:  Are you having pain? No  Vitals:   10/20/23 1529  BP: (!) 142/76  Pulse: 82      PRECAUTIONS: Fall  WEIGHT BEARING RESTRICTIONS: No  FALLS: Has patient fallen in last 6 months? No  LIVING ENVIRONMENT: Lives with: lives with their son Lives in: House/apartment Stairs: Yes: External: 4 steps; on right going up Has following equipment at home:  Walker with no wheels   PLOF: Independent and Leisure: Enjoys reading   PATIENT GOALS: Wants to improve balance on her L side   OBJECTIVE:  Note: Objective measures were completed at Evaluation unless otherwise noted.  DIAGNOSTIC FINDINGS: MRI brain 10/03/23: IMPRESSION: 1. Acute infarct involving the posterior aspect of the right putamen, posterior aspect of the body of the right caudate nucleus and adjacent white matter with associated petechial hemorrhage within the right caudate body. 2. Other punctate foci of restricted diffusion within the right frontal white matter and right postcentral gyrus, consistent with acute infarcts. 3. Scattered and confluent foci of T2 hyperintensity within the white matter of the cerebral hemispheres, nonspecific but may represent chronic microvascular ischemic changes.  COGNITION: Overall cognitive  status: Within functional limits for tasks assessed   SENSATION: Light touch: WFL  Reports some slight tingling in R and L toes   COORDINATION: Heel to shin: Slightly slower with LLE   POSTURE: No Significant postural limitations   LOWER EXTREMITY MMT:    MMT Right Eval Left Eval  Hip flexion 5 4-  Hip extension    Hip abduction 5 4+  Hip adduction 5 5  Hip internal rotation    Hip external rotation    Knee flexion 5 4-  Knee extension 5 4-  Ankle dorsiflexion 4+ 4+  Ankle plantarflexion    Ankle inversion    Ankle eversion    (Blank rows = not tested)  All tested in sitting   BED MOBILITY:  Pt reports since CVA it is slower, but not difficult  TRANSFERS: Assistive device utilized: None  Sit to stand: SBA Stand to sit: SBA Performed with no UE support, equal weight bearing through BLE  STAIRS: Level of Assistance: SBA Stair Negotiation Technique: Step to Pattern Forwards with Bilateral Rails Number of Stairs: 4  Height of Stairs: 6"  Comments: Prior to CVA, pt did not have to use a handrail   GAIT: Gait pattern: decreased step length- Right, decreased stance time- Left, decreased stride length, and decreased hip/knee flexion- Left, unsteady gait  Distance walked: Clinic distances  Assistive device utilized: None Level of assistance: CGA Comments: Close CGA with no AD due to unsteadiness  Pt ambulating in and out of clinic with walker (no wheels) with mod I   FUNCTIONAL TESTS:  5 times sit to stand: 18.9 seconds with no UE support  Timed up and go (TUG): 13 seconds with no AD with CGA 10 meter walk test: 17.85 seconds with no AD and CGA = 1.84 ft/sec  TREATMENT DATE:   Pt ambulating into and out of clinic with rollator mod I and to and from NuStep Small distances in clinic with no AD with CGA.  Therapeutic Exercise: 10  reps bridging with BLE, 7 reps alternating single leg bridging with cues for glute activation 10 reps sit <> stands with no UE support  Standing hip ABD 2 x 10 reps each side with BUE support, pt with an instance of L knee instability afterwards and having to grab onto counter briefly for support  Standing marching 10 reps each leg with BUE support Supine straight leg raise with LLE 2 x 5 reps, pt with limited ROM due to weakness, pt with difficulty with lowering   NuStep with BUE/BLE at Gear 3.0 for 8 minutes for strengthening, activity tolerance, pt reporting RPE at 6/10    PATIENT EDUCATION: Education details: Initial HEP for strengthening  Person educated: Patient Education method: Programmer, multimedia, Demonstration, Verbal cues, and Handouts Education comprehension: verbalized understanding, returned demonstration, and verbal cues required  HOME EXERCISE PROGRAM: Access Code: [email or web address] URL: [email or web address] Date: 10/20/2023 Prepared by: Sherlie Ban  Exercises - Single Leg Bridge  - 1 x daily - 5 x weekly - 1-2 sets - 10 reps - Small Range Straight Leg Raise  - 2 x daily - 5 x weekly - 2 sets - 5 reps - Sit to Stand  - 2 x daily - 5 x weekly - 1 sets - 10 reps - Standing March with Counter Support  - 1-2 x daily - 5 x weekly - 1-2 sets - 10 reps - Standing Hip Abduction with Counter Support  - 1-2 x daily - 5 x weekly - 1-2 sets - 10 reps  GOALS: Goals reviewed with patient? Yes  SHORT TERM GOALS: Target date: 11/07/2023  Pt will be independent with initial HEP in order to build upon functional gains made in therapy. Baseline: Goal status: INITIAL  2.  Pt will improve FGA to at least a 15/30 in order to demo decr fall risk.  Baseline: 11/30 Goal status: INITIAL  3.  Pt will improve gait speed with no AD vs. LRAD to at least 2.2 ft/sec in order to demo improved community mobility.   Baseline: 17.85 seconds with no AD and CGA = 1.84 ft/sec  Goal status:  INITIAL  4.  Pt will ambulate at least 67' with no AD with supervision in order to demo improved functional mobility.  Baseline: currently needs CGA with no AD  Goal status: INITIAL    LONG TERM GOALS: Target date: 12/05/2023  Pt will be independent with final HEP in order to build upon functional gains made in therapy. Baseline:  Goal status: INITIAL  2.  Pt will improve FGA to at least a 21/30 in order to demo decr fall risk. Baseline: 11/30 Goal status: INITIAL  3.   Pt will improve gait speed with no AD vs. LRAD to at least 2.7 ft/sec in order to demo improved community mobility.  Baseline: 17.85 seconds with no AD and CGA = 1.84 ft/sec  Goal status: INITIAL  4.  Pt will improve 5x sit<>stand to less than or equal to 14 sec with no UE support to demonstrate improved functional strength and transfer efficiency.   Baseline: 18.9 seconds with no UE support Goal status: INITIAL  5.  Pt will improve TUG time to 10 seconds or less with supervision and no AD in order to demo decrease fall risk. Baseline: 13 seconds and CGA Goal status:  INITIAL   ASSESSMENT:  CLINICAL IMPRESSION: Today's skilled session focused on initiating HEP for LLE>RLE strengthening. Pt with decr LLE strength, esp with quadriceps with pt fatiguing easily with straight leg raises and unable to perform in full ROM. Pt tolerated session well and did report LLE felt less shaky at end of session after exercises. Educated to continue with use of rollator to decr fall risk. Will continue per POC.   OBJECTIVE IMPAIRMENTS: Abnormal gait, decreased activity tolerance, decreased balance, decreased coordination, difficulty walking, and decreased strength.   ACTIVITY LIMITATIONS: carrying, lifting, standing, stairs, transfers, bathing, and locomotion level  PARTICIPATION LIMITATIONS: meal prep, driving, shopping, and community activity  PERSONAL FACTORS: Age, Past/current experiences, Time since onset of  injury/illness/exacerbation, Transportation, and 1-2 comorbidities:  HLD HTN   are also affecting patient's functional outcome.   REHAB POTENTIAL: Good  CLINICAL DECISION MAKING: Stable/uncomplicated  EVALUATION COMPLEXITY: Low  PLAN:  PT FREQUENCY: 1x/week - due to family preference/transportation  PT DURATION: 8 weeks  PLANNED INTERVENTIONS: 97164- PT Re-evaluation, 97110-Therapeutic exercises, 97530- Therapeutic activity, [id number]- Neuromuscular re-education, 97535- Self Care, [id number]- Manual therapy, [id number]- Gait training, Patient/Family education, Balance training, Stair training, and DME instructions  PLAN FOR NEXT SESSION:  LLE strength, standing balance. Work on balance and strength tasks, gait with no AD    Drake Leach, PT, DPT 10/20/2023, 4:15 PM

## 2023-10-21 ENCOUNTER — Ambulatory Visit: Payer: Medicare Other | Admitting: Physical Therapy

## 2023-10-28 ENCOUNTER — Ambulatory Visit: Payer: Medicare Other | Attending: Neurology | Admitting: Physical Therapy

## 2023-10-28 ENCOUNTER — Encounter: Payer: Self-pay | Admitting: Physical Therapy

## 2023-10-28 VITALS — BP 137/75 | HR 75

## 2023-10-28 DIAGNOSIS — M6281 Muscle weakness (generalized): Secondary | ICD-10-CM | POA: Diagnosis not present

## 2023-10-28 DIAGNOSIS — R2689 Other abnormalities of gait and mobility: Secondary | ICD-10-CM | POA: Insufficient documentation

## 2023-10-28 DIAGNOSIS — R2681 Unsteadiness on feet: Secondary | ICD-10-CM | POA: Insufficient documentation

## 2023-10-28 NOTE — Therapy (Signed)
 OUTPATIENT PHYSICAL THERAPY NEURO TREATMENT   Patient Name: Kristy [name] MRN: [id number] DOB:[birth year], 78 y.o., female Today's Date: 10/28/2023   PCP: Dwight Trula SQUIBB, MD  REFERRING PROVIDER: Rosemarie Eather RAMAN, MD    END OF SESSION:  PT End of Session - 10/28/23 1524     Visit Number 3    Number of Visits 9    Date for PT Re-Evaluation 12/09/23    Authorization Type MEDICARE PART A AND B    PT Start Time 1523    PT Stop Time 1603    PT Time Calculation (min) 40 min    Equipment Utilized During Treatment Gait belt    Activity Tolerance Patient tolerated treatment well    Behavior During Therapy WFL for tasks assessed/performed              Past Medical History:  Diagnosis Date   Allergy    Anemia    Hyperlipidemia    Hypertension    Past Surgical History:  Procedure Laterality Date   ABDOMINAL HYSTERECTOMY     LOOP RECORDER INSERTION N/A 10/04/2023   Procedure: LOOP RECORDER INSERTION;  Surgeon: Lesia Ozell Barter, PA-C;  Location: [facility];  Service: Cardiovascular;  Laterality: N/A;   PARTIAL HYSTERECTOMY     TUBAL LIGATION     Patient Active Problem List   Diagnosis Date Noted   Acute ischemic stroke (HCC) 10/02/2023   Screening for colon cancer 08/31/2018   Encounter for medication monitoring 08/31/2018   Dyslipidemia 08/31/2018   Dizziness 08/31/2018   Essential hypertension 08/31/2018   Tinnitus 08/05/2017   Paresthesia 08/05/2017   History of irregular heartbeat 08/05/2017   Allergic rhinitis 05/16/2014   Sleep pattern disturbance 11/23/2013   Dyspepsia 05/30/2013   Hypercholesteremia 04/05/2012    ONSET DATE: 10/04/2023  REFERRING DIAG: I63.9 (ICD-10-CM) - Acute ischemic stroke (HCC)  THERAPY DIAG:  Unsteadiness on feet  Other abnormalities of gait and mobility  Muscle weakness (generalized)  Rationale for Evaluation and Treatment: Rehabilitation  SUBJECTIVE:                                                                                                                                                                                              SUBJECTIVE STATEMENT: No falls, feels like she is walking better. Notes L knee was popping when she was going to stand   Pt accompanied by: family member and daughter in waiting room    PERTINENT HISTORY: Pt admitted 10/02/23 with L side weakness. Pt with Acute Ischemic Infarct: Small acute infarcts in the posterior aspect of the right putamen, posterior aspect of  the right caudate nucleus and adjacent white matter s/p TNK. Etiology: Likely embolic from cryptogenic source given large size of the subcortical infarct. Loop record implanted  Was discharged home from the hospital on 10/04/23.   PMH:  HLD HTN   PAIN:  Are you having pain? No  Vitals:   10/28/23 1527  BP: 137/75  Pulse: 75     PRECAUTIONS: Fall  WEIGHT BEARING RESTRICTIONS: No  FALLS: Has patient fallen in last 6 months? No  LIVING ENVIRONMENT: Lives with: lives with their son Lives in: House/apartment Stairs: Yes: External: 4 steps; on right going up Has following equipment at home:  Walker with no wheels   PLOF: Independent and Leisure: Enjoys reading   PATIENT GOALS: Wants to improve balance on her L side   OBJECTIVE:  Note: Objective measures were completed at Evaluation unless otherwise noted.  DIAGNOSTIC FINDINGS: MRI brain 10/03/23: IMPRESSION: 1. Acute infarct involving the posterior aspect of the right putamen, posterior aspect of the body of the right caudate nucleus and adjacent white matter with associated petechial hemorrhage within the right caudate body. 2. Other punctate foci of restricted diffusion within the right frontal white matter and right postcentral gyrus, consistent with acute infarcts. 3. Scattered and confluent foci of T2 hyperintensity within the white matter of the cerebral hemispheres, nonspecific but may represent chronic microvascular ischemic  changes.  COGNITION: Overall cognitive status: Within functional limits for tasks assessed   SENSATION: Light touch: WFL  Reports some slight tingling in R and L toes   COORDINATION: Heel to shin: Slightly slower with LLE   POSTURE: No Significant postural limitations   LOWER EXTREMITY MMT:    MMT Right Eval Left Eval  Hip flexion 5 4-  Hip extension    Hip abduction 5 4+  Hip adduction 5 5  Hip internal rotation    Hip external rotation    Knee flexion 5 4-  Knee extension 5 4-  Ankle dorsiflexion 4+ 4+  Ankle plantarflexion    Ankle inversion    Ankle eversion    (Blank rows = not tested)  All tested in sitting   BED MOBILITY:  Pt reports since CVA it is slower, but not difficult  TRANSFERS: Assistive device utilized: None  Sit to stand: SBA Stand to sit: SBA Performed with no UE support, equal weight bearing through BLE  STAIRS: Level of Assistance: SBA Stair Negotiation Technique: Step to Pattern Forwards with Bilateral Rails Number of Stairs: 4  Height of Stairs: 6  Comments: Prior to CVA, pt did not have to use a handrail   GAIT: Gait pattern: decreased step length- Right, decreased stance time- Left, decreased stride length, and decreased hip/knee flexion- Left, unsteady gait  Distance walked: Clinic distances  Assistive device utilized: None Level of assistance: CGA Comments: Close CGA with no AD due to unsteadiness  Pt ambulating in and out of clinic with walker (no wheels) with mod I   FUNCTIONAL TESTS:  5 times sit to stand: 18.9 seconds with no UE support  Timed up and go (TUG): 13 seconds with no AD with CGA 10 meter walk test: 17.85 seconds with no AD and CGA = 1.84 ft/sec  TREATMENT DATE:   Pt ambulating into and out of clinic with RW mod I Small distances in clinic with no AD with CGA.  Therapeutic  Exercise: SciFit with BUE/BLE Multi-peaks at Gear 2.5 for 8 minutes for strengthening, activity tolerance/endurance. Pt reporting RPE at 5/10  At staircase: 10 reps step ups with RLE and marching LLE for dynamic SLS, then performed 10 step ups with LLE and marching RLE, pt needing to use intermittent UE support for balance, CGA for balance  NMR:  Tandem gait down and back x2 reps in // bars, with trying not to use UE support  With 2 cones: alternating forward taps 10 reps each side, then 10 reps alternating taps for SLS stability, able to perform without UE support  On air ex: With feet wider > feet hip width distance EC 3 x 30 seconds With feet together EO: 2 x 10 reps head turns, 2 x 10 reps head nods 10 reps sit <> stands with holding 10# kettle bell   PATIENT EDUCATION: Education details: Continue HEP  Person educated: Patient Education method: Explanation Education comprehension: verbalized understanding  HOME EXERCISE PROGRAM: Access Code: [email or web address] URL: [email or web address] Date: 10/20/2023 Prepared by: Sheffield Senate  Exercises - Single Leg Bridge  - 1 x daily - 5 x weekly - 1-2 sets - 10 reps - Small Range Straight Leg Raise  - 2 x daily - 5 x weekly - 2 sets - 5 reps - Sit to Stand  - 2 x daily - 5 x weekly - 1 sets - 10 reps - Standing March with Counter Support  - 1-2 x daily - 5 x weekly - 1-2 sets - 10 reps - Standing Hip Abduction with Counter Support  - 1-2 x daily - 5 x weekly - 1-2 sets - 10 reps  GOALS: Goals reviewed with patient? Yes  SHORT TERM GOALS: Target date: 11/07/2023  Pt will be independent with initial HEP in order to build upon functional gains made in therapy. Baseline: Goal status: INITIAL  2.  Pt will improve FGA to at least a 15/30 in order to demo decr fall risk.  Baseline: 11/30 Goal status: INITIAL  3.  Pt will improve gait speed with no AD vs. LRAD to at least 2.2 ft/sec in order to demo improved community mobility.    Baseline: 17.85 seconds with no AD and CGA = 1.84 ft/sec  Goal status: INITIAL  4.  Pt will ambulate at least 54' with no AD with supervision in order to demo improved functional mobility.  Baseline: currently needs CGA with no AD  Goal status: INITIAL    LONG TERM GOALS: Target date: 12/05/2023  Pt will be independent with final HEP in order to build upon functional gains made in therapy. Baseline:  Goal status: INITIAL  2.  Pt will improve FGA to at least a 21/30 in order to demo decr fall risk. Baseline: 11/30 Goal status: INITIAL  3.   Pt will improve gait speed with no AD vs. LRAD to at least 2.7 ft/sec in order to demo improved community mobility.  Baseline: 17.85 seconds with no AD and CGA = 1.84 ft/sec  Goal status: INITIAL  4.  Pt will improve 5x sit<>stand to less than or equal to 14 sec with no UE support to demonstrate improved functional strength and transfer efficiency.   Baseline: 18.9 seconds with no UE support Goal status: INITIAL  5.  Pt will improve TUG time to 10 seconds or less with supervision  and no AD in order to demo decrease fall risk. Baseline: 13 seconds and CGA Goal status: INITIAL   ASSESSMENT:  CLINICAL IMPRESSION: Today's skilled session focused on working on BLE strengthening and balance. Pt most challenged with balance tasks on compliant surfaces and SLS stability with step ups with marches. Pt overall tolerated session well, needing intermittent rest breaks. Pt demonstrating improved in LLE stability during gait with no AD. Will continue per POC.   OBJECTIVE IMPAIRMENTS: Abnormal gait, decreased activity tolerance, decreased balance, decreased coordination, difficulty walking, and decreased strength.   ACTIVITY LIMITATIONS: carrying, lifting, standing, stairs, transfers, bathing, and locomotion level  PARTICIPATION LIMITATIONS: meal prep, driving, shopping, and community activity  PERSONAL FACTORS: Age, Past/current experiences, Time  since onset of injury/illness/exacerbation, Transportation, and 1-2 comorbidities:  HLD HTN   are also affecting patient's functional outcome.   REHAB POTENTIAL: Good  CLINICAL DECISION MAKING: Stable/uncomplicated  EVALUATION COMPLEXITY: Low  PLAN:  PT FREQUENCY: 1x/week - due to family preference/transportation  PT DURATION: 8 weeks  PLANNED INTERVENTIONS: 97164- PT Re-evaluation, 97110-Therapeutic exercises, 97530- Therapeutic activity, [id number]- Neuromuscular re-education, 97535- Self Care, [id number]- Manual therapy, [phone]- Gait training, Patient/Family education, Balance training, Stair training, and DME instructions  PLAN FOR NEXT SESSION:  LLE strength, standing balance. Work on balance and strength tasks, gait with no AD   CHECK GOALS NEXT WEEK    [place] LOISE Senate, PT, DPT 10/28/2023, 4:14 PM

## 2023-10-31 ENCOUNTER — Telehealth: Payer: Self-pay | Admitting: Cardiology

## 2023-10-31 NOTE — Telephone Encounter (Signed)
   1. Has your device fired?   2. Is you device beeping?   3. Are you experiencing draining or swelling at device site?   4. Are you calling to see if we received your device transmission?   5. Have you passed out?    Please route to Device Clinic Pool   Pt is checking if she need to do anything for her ILR

## 2023-11-01 NOTE — Telephone Encounter (Signed)
Spoke with pt and reassured her about her questions with her remotes. Pt verbalized understanding

## 2023-11-04 ENCOUNTER — Ambulatory Visit: Payer: Medicare Other | Admitting: Physical Therapy

## 2023-11-04 ENCOUNTER — Encounter: Payer: Self-pay | Admitting: Physical Therapy

## 2023-11-04 DIAGNOSIS — M6281 Muscle weakness (generalized): Secondary | ICD-10-CM

## 2023-11-04 DIAGNOSIS — R2689 Other abnormalities of gait and mobility: Secondary | ICD-10-CM | POA: Diagnosis not present

## 2023-11-04 DIAGNOSIS — R2681 Unsteadiness on feet: Secondary | ICD-10-CM | POA: Diagnosis not present

## 2023-11-04 NOTE — Therapy (Signed)
OUTPATIENT PHYSICAL THERAPY NEURO TREATMENT   Patient Name: Kristy [name] MRN: [id number] DOB:1946/04/14, 78 y.o., female Today's Date: 11/04/2023   PCP: Thana Ates, MD  REFERRING PROVIDER: Micki Riley, MD    END OF SESSION:  PT End of Session - 11/04/23 1529     Visit Number 4    Number of Visits 9    Date for PT Re-Evaluation 12/09/23    Authorization Type MEDICARE PART A AND B    PT Start Time 1528    PT Stop Time 1608    PT Time Calculation (min) 40 min    Equipment Utilized During Treatment Gait belt    Activity Tolerance Patient tolerated treatment well    Behavior During Therapy WFL for tasks assessed/performed              Past Medical History:  Diagnosis Date   Allergy    Anemia    Hyperlipidemia    Hypertension    Past Surgical History:  Procedure Laterality Date   ABDOMINAL HYSTERECTOMY     LOOP RECORDER INSERTION N/A 10/04/2023   Procedure: LOOP RECORDER INSERTION;  Surgeon: Graciella Freer, PA-C;  Location: [facility];  Service: Cardiovascular;  Laterality: N/A;   PARTIAL HYSTERECTOMY     TUBAL LIGATION     Patient Active Problem List   Diagnosis Date Noted   Acute ischemic stroke (HCC) 10/02/2023   Screening for colon cancer 08/31/2018   Encounter for medication monitoring 08/31/2018   Dyslipidemia 08/31/2018   Dizziness 08/31/2018   Essential hypertension 08/31/2018   Tinnitus 08/05/2017   Paresthesia 08/05/2017   History of irregular heartbeat 08/05/2017   Allergic rhinitis 05/16/2014   Sleep pattern disturbance 11/23/2013   Dyspepsia 05/30/2013   Hypercholesteremia 04/05/2012    ONSET DATE: 10/04/2023  REFERRING DIAG: I63.9 (ICD-10-CM) - Acute ischemic stroke (HCC)  THERAPY DIAG:  Unsteadiness on feet  Other abnormalities of gait and mobility  Muscle weakness (generalized)  Rationale for Evaluation and Treatment: Rehabilitation  SUBJECTIVE:                                                                                                                                                                                              SUBJECTIVE STATEMENT: No falls. Has been doing walking at home without her AD.   Pt accompanied by: self  PERTINENT HISTORY: Pt admitted 10/02/23 with L side weakness. Pt with Acute Ischemic Infarct: Small acute infarcts in the posterior aspect of the right putamen, posterior aspect of the right caudate nucleus and adjacent white matter s/p TNK. Etiology: Likely embolic from cryptogenic source  given large size of the subcortical infarct. Loop record implanted  Was discharged home from the hospital on 10/04/23.   PMH:  HLD HTN   PAIN:  Are you having pain? No  There were no vitals filed for this visit.    PRECAUTIONS: Fall  WEIGHT BEARING RESTRICTIONS: No  FALLS: Has patient fallen in last 6 months? No  LIVING ENVIRONMENT: Lives with: lives with their son Lives in: House/apartment Stairs: Yes: External: 4 steps; on right going up Has following equipment at home:  Walker with no wheels   PLOF: Independent and Leisure: Enjoys reading   PATIENT GOALS: Wants to improve balance on her L side   OBJECTIVE:  Note: Objective measures were completed at Evaluation unless otherwise noted.  DIAGNOSTIC FINDINGS: MRI brain 10/03/23: IMPRESSION: 1. Acute infarct involving the posterior aspect of the right putamen, posterior aspect of the body of the right caudate nucleus and adjacent white matter with associated petechial hemorrhage within the right caudate body. 2. Other punctate foci of restricted diffusion within the right frontal white matter and right postcentral gyrus, consistent with acute infarcts. 3. Scattered and confluent foci of T2 hyperintensity within the white matter of the cerebral hemispheres, nonspecific but may represent chronic microvascular ischemic changes.  COGNITION: Overall cognitive status: Within functional limits for tasks  assessed   SENSATION: Light touch: WFL  Reports some slight tingling in R and L toes   COORDINATION: Heel to shin: Slightly slower with LLE   POSTURE: No Significant postural limitations   LOWER EXTREMITY MMT:    MMT Right Eval Left Eval  Hip flexion 5 4-  Hip extension    Hip abduction 5 4+  Hip adduction 5 5  Hip internal rotation    Hip external rotation    Knee flexion 5 4-  Knee extension 5 4-  Ankle dorsiflexion 4+ 4+  Ankle plantarflexion    Ankle inversion    Ankle eversion    (Blank rows = not tested)  All tested in sitting   BED MOBILITY:  Pt reports since CVA it is slower, but not difficult  TRANSFERS: Assistive device utilized: None  Sit to stand: SBA Stand to sit: SBA Performed with no UE support, equal weight bearing through BLE  STAIRS: Level of Assistance: SBA Stair Negotiation Technique: Step to Pattern Forwards with Bilateral Rails Number of Stairs: 4  Height of Stairs: 6"  Comments: Prior to CVA, pt did not have to use a handrail   GAIT: Gait pattern: decreased step length- Right, decreased stance time- Left, decreased stride length, and decreased hip/knee flexion- Left, unsteady gait  Distance walked: Clinic distances  Assistive device utilized: None Level of assistance: CGA Comments: Close CGA with no AD due to unsteadiness  Pt ambulating in and out of clinic with walker (no wheels) with mod I   FUNCTIONAL TESTS:  5 times sit to stand: 18.9 seconds with no UE support  Timed up and go (TUG): 13 seconds with no AD with CGA 10 meter walk test: 17.85 seconds with no AD and CGA = 1.84 ft/sec     [facility] PT Assessment - 11/04/23 1535       Functional Gait  Assessment   Gait assessed  Yes    Gait Level Surface Walks 20 ft in less than 7 sec but greater than 5.5 sec, uses assistive device, slower speed, mild gait deviations, or deviates 6-10 in outside of the 12 in walkway width.   5.9   Change in Gait Speed  Able to smoothly change  walking speed without loss of balance or gait deviation. Deviate no more than 6 in outside of the 12 in walkway width.    Gait with Horizontal Head Turns Performs head turns smoothly with slight change in gait velocity (eg, minor disruption to smooth gait path), deviates 6-10 in outside 12 in walkway width, or uses an assistive device.    Gait with Vertical Head Turns Performs task with slight change in gait velocity (eg, minor disruption to smooth gait path), deviates 6 - 10 in outside 12 in walkway width or uses assistive device    Gait and Pivot Turn Pivot turns safely within 3 sec and stops quickly with no loss of balance.    Step Over Obstacle Is able to step over 2 stacked shoe boxes taped together (9 in total height) without changing gait speed. No evidence of imbalance.    Gait with Narrow Base of Support Ambulates 7-9 steps.   8 steps   Gait with Eyes Closed Walks 20 ft, uses assistive device, slower speed, mild gait deviations, deviates 6-10 in outside 12 in walkway width. Ambulates 20 ft in less than 9 sec but greater than 7 sec.   7.8   Ambulating Backwards Walks 20 ft, no assistive devices, good speed, no evidence for imbalance, normal gait    Steps Alternating feet, must use rail.    Total Score 24    FGA comment: 24/30 = Moderate Fall Risk                                                                                                                                        TREATMENT DATE:   Pt ambulating into and out of clinic with RW mod I Small distances in clinic with no AD with supervision  Therapeutic Activity:  Goal Assessment: Gait speed: 9.94 seconds with no AD = 3.29 ft/sec Pt able to walk 345' with no AD with supervision    [facility] PT Assessment - 11/04/23 1535       Functional Gait  Assessment   Gait assessed  Yes    Gait Level Surface Walks 20 ft in less than 7 sec but greater than 5.5 sec, uses assistive device, slower speed, mild gait deviations, or deviates  6-10 in outside of the 12 in walkway width.   5.9   Change in Gait Speed Able to smoothly change walking speed without loss of balance or gait deviation. Deviate no more than 6 in outside of the 12 in walkway width.    Gait with Horizontal Head Turns Performs head turns smoothly with slight change in gait velocity (eg, minor disruption to smooth gait path), deviates 6-10 in outside 12 in walkway width, or uses an assistive device.    Gait with Vertical Head Turns Performs task with slight change in gait velocity (eg, minor disruption to smooth gait path), deviates 6 -  10 in outside 12 in walkway width or uses assistive device    Gait and Pivot Turn Pivot turns safely within 3 sec and stops quickly with no loss of balance.    Step Over Obstacle Is able to step over 2 stacked shoe boxes taped together (9 in total height) without changing gait speed. No evidence of imbalance.    Gait with Narrow Base of Support Ambulates 7-9 steps.   8 steps   Gait with Eyes Closed Walks 20 ft, uses assistive device, slower speed, mild gait deviations, deviates 6-10 in outside 12 in walkway width. Ambulates 20 ft in less than 9 sec but greater than 7 sec.   7.8   Ambulating Backwards Walks 20 ft, no assistive devices, good speed, no evidence for imbalance, normal gait    Steps Alternating feet, must use rail.    Total Score 24    FGA comment: 24/30 = Moderate Fall Risk             Therapeutic Exercise:  SciFit with BUE/BLE Multi-peaks at Gear 3.0 for 8 minutes for strengthening, activity tolerance/endurance. Pt reporting RPE at 5/10   NMR:  Tandem gait down and back x2 reps at countertop  On air ex with more narrow BOS: EC 3 x 30 seconds On rockerboard in A/P direction: 2 x 10 reps mini squats, improved balance with 2nd rep Keeping board steady: 10 reps head turns, 10 reps head nods   PATIENT EDUCATION: Education details: Results of goals, balance additions to HEP  Person educated: Patient Education  method: Explanation, Demonstration, Verbal cues, and Handouts Education comprehension: verbalized understanding and returned demonstration  HOME EXERCISE PROGRAM: Access Code: [email or web address] URL: [email or web address] Date: 11/04/2023 Prepared by: Sherlie Ban  Exercises - Single Leg Bridge  - 1 x daily - 5 x weekly - 1-2 sets - 10 reps - Small Range Straight Leg Raise  - 2 x daily - 5 x weekly - 2 sets - 5 reps - Sit to Stand  - 2 x daily - 5 x weekly - 1 sets - 10 reps - Standing March with Counter Support  - 1-2 x daily - 5 x weekly - 1-2 sets - 10 reps - Standing Hip Abduction with Counter Support  - 1-2 x daily - 5 x weekly - 1-2 sets - 10 reps - Tandem Walking with Counter Support  - 1-2 x daily - 5 x weekly - 3 sets - Romberg Stance Eyes Closed on Foam Pad  - 1-2 x daily - 5 x weekly - 3 sets - 30 hold  GOALS: Goals reviewed with patient? Yes  SHORT TERM GOALS: Target date: 11/07/2023  Pt will be independent with initial HEP in order to build upon functional gains made in therapy. Baseline: Goal status: MET  2.  Pt will improve FGA to at least a 15/30 in order to demo decr fall risk.  Baseline: 11/30  24/30 (2/14) Goal status: MET  3.  Pt will improve gait speed with no AD vs. LRAD to at least 2.2 ft/sec in order to demo improved community mobility.   Baseline: 17.85 seconds with no AD and CGA = 1.84 ft/sec    9.94 seconds with no AD = 3.29 ft/sec Goal status: MET  4.  Pt will ambulate at least 21' with no AD with supervision in order to demo improved functional mobility.  Baseline: currently needs CGA with no AD   Able to walk 345' with supervision  Goal status: MET  LONG TERM GOALS: Target date: 12/05/2023  Pt will be independent with final HEP in order to build upon functional gains made in therapy. Baseline:  Goal status: INITIAL  2.  Pt will improve FGA to at least a 26/30 in order to demo decr fall risk. Baseline: 11/30  24/30  (2/14) Goal status: REVISED  3.   Pt will improve gait speed with no AD vs. LRAD to at least 2.7 ft/sec in order to demo improved community mobility.  Baseline: 17.85 seconds with no AD and CGA = 1.84 ft/sec    9.94 seconds with no AD = 3.29 ft/sec (2/14) Goal status: MET  4.  Pt will improve 5x sit<>stand to less than or equal to 14 sec with no UE support to demonstrate improved functional strength and transfer efficiency.   Baseline: 18.9 seconds with no UE support Goal status: INITIAL  5.  Pt will improve TUG time to 10 seconds or less with supervision and no AD in order to demo decrease fall risk. Baseline: 13 seconds and CGA Goal status: INITIAL   ASSESSMENT:  CLINICAL IMPRESSION: Today's skilled session focused on assessing STGs. Pt has met all STGs and has made excellent progress! Pt with SIGNIFICANT improvement of FGA from a score of 11/30 at eval to a score of 24/30 today. Pt also with significant improvement in gait speed from 1.84 ft/sec > 3.29 ft/sec with no AD, indicating improved community mobility. LTGs updated/revised as appropriate. Remainder of session focused on balance tasks on compliant surfaces and adding EC and tandem gait to HEP. Will continue per POC.   OBJECTIVE IMPAIRMENTS: Abnormal gait, decreased activity tolerance, decreased balance, decreased coordination, difficulty walking, and decreased strength.   ACTIVITY LIMITATIONS: carrying, lifting, standing, stairs, transfers, bathing, and locomotion level  PARTICIPATION LIMITATIONS: meal prep, driving, shopping, and community activity  PERSONAL FACTORS: Age, Past/current experiences, Time since onset of injury/illness/exacerbation, Transportation, and 1-2 comorbidities:  HLD HTN   are also affecting patient's functional outcome.   REHAB POTENTIAL: Good  CLINICAL DECISION MAKING: Stable/uncomplicated  EVALUATION COMPLEXITY: Low  PLAN:  PT FREQUENCY: 1x/week - due to family  preference/transportation  PT DURATION: 8 weeks  PLANNED INTERVENTIONS: [id number]- PT Re-evaluation, 97110-Therapeutic exercises, 97530- Therapeutic activity, [id number]- Neuromuscular re-education, 97535- Self Care, [id number]- Manual therapy, [phone]- Gait training, Patient/Family education, Balance training, Stair training, and DME instructions  PLAN FOR NEXT SESSION:  LLE strength, standing balance. Work on balance and strength tasks, gait with no AD    Drake Leach, PT, DPT 11/04/2023, 4:13 PM

## 2023-11-08 ENCOUNTER — Ambulatory Visit (INDEPENDENT_AMBULATORY_CARE_PROVIDER_SITE_OTHER): Payer: Medicare Other

## 2023-11-08 DIAGNOSIS — I639 Cerebral infarction, unspecified: Secondary | ICD-10-CM

## 2023-11-09 LAB — CUP PACEART REMOTE DEVICE CHECK
Date Time Interrogation Session: 20250218171211
Implantable Pulse Generator Implant Date: 20250114

## 2023-11-11 ENCOUNTER — Encounter: Payer: Self-pay | Admitting: Physical Therapy

## 2023-11-11 ENCOUNTER — Ambulatory Visit: Payer: Medicare Other | Admitting: Physical Therapy

## 2023-11-11 DIAGNOSIS — R2689 Other abnormalities of gait and mobility: Secondary | ICD-10-CM | POA: Diagnosis not present

## 2023-11-11 DIAGNOSIS — R2681 Unsteadiness on feet: Secondary | ICD-10-CM

## 2023-11-11 DIAGNOSIS — M6281 Muscle weakness (generalized): Secondary | ICD-10-CM | POA: Diagnosis not present

## 2023-11-11 NOTE — Therapy (Signed)
 OUTPATIENT PHYSICAL THERAPY NEURO TREATMENT   Patient Name: Kristy [name] MRN: [id number] DOB:[birth year], 78 y.o., female Today's Date: 11/11/2023   PCP: Thana Ates, MD  REFERRING PROVIDER: Micki Riley, MD    END OF SESSION:  PT End of Session - 11/11/23 1511     Visit Number 5    Number of Visits 9    Date for PT Re-Evaluation 12/09/23    Authorization Type MEDICARE PART A AND B    PT Start Time 1509    PT Stop Time 1549    PT Time Calculation (min) 40 min    Equipment Utilized During Treatment Gait belt    Activity Tolerance Patient tolerated treatment well    Behavior During Therapy WFL for tasks assessed/performed              Past Medical History:  Diagnosis Date   Allergy    Anemia    Hyperlipidemia    Hypertension    Past Surgical History:  Procedure Laterality Date   ABDOMINAL HYSTERECTOMY     LOOP RECORDER INSERTION N/A 10/04/2023   Procedure: LOOP RECORDER INSERTION;  Surgeon: Graciella Freer, PA-C;  Location: [facility];  Service: Cardiovascular;  Laterality: N/A;   PARTIAL HYSTERECTOMY     TUBAL LIGATION     Patient Active Problem List   Diagnosis Date Noted   Acute ischemic stroke (HCC) 10/02/2023   Screening for colon cancer 08/31/2018   Encounter for medication monitoring 08/31/2018   Dyslipidemia 08/31/2018   Dizziness 08/31/2018   Essential hypertension 08/31/2018   Tinnitus 08/05/2017   Paresthesia 08/05/2017   History of irregular heartbeat 08/05/2017   Allergic rhinitis 05/16/2014   Sleep pattern disturbance 11/23/2013   Dyspepsia 05/30/2013   Hypercholesteremia 04/05/2012    ONSET DATE: 10/04/2023  REFERRING DIAG: I63.9 (ICD-10-CM) - Acute ischemic stroke (HCC)  THERAPY DIAG:  Unsteadiness on feet  Other abnormalities of gait and mobility  Muscle weakness (generalized)  Rationale for Evaluation and Treatment: Rehabilitation  SUBJECTIVE:                                                                                                                                                                                              SUBJECTIVE STATEMENT: On Monday/Tuesday, did a lot of walking without her walker and that made her back bothersome. Felt like it was carrying a ton of weight. Ever since then, has been using her walker. Reports it feels better. Worked on her balance exercises at home and reports they were a good challenge.   Pt accompanied by: self  PERTINENT HISTORY: Pt admitted  10/02/23 with L side weakness. Pt with Acute Ischemic Infarct: Small acute infarcts in the posterior aspect of the right putamen, posterior aspect of the right caudate nucleus and adjacent white matter s/p TNK. Etiology: Likely embolic from cryptogenic source given large size of the subcortical infarct. Loop record implanted  Was discharged home from the hospital on 10/04/23.   PMH:  HLD HTN   PAIN:  Are you having pain? No  There were no vitals filed for this visit.  PRECAUTIONS: Fall  WEIGHT BEARING RESTRICTIONS: No  FALLS: Has patient fallen in last 6 months? No  LIVING ENVIRONMENT: Lives with: lives with their son Lives in: House/apartment Stairs: Yes: External: 4 steps; on right going up Has following equipment at home:  Walker with no wheels   PLOF: Independent and Leisure: Enjoys reading   PATIENT GOALS: Wants to improve balance on her L side   OBJECTIVE:  Note: Objective measures were completed at Evaluation unless otherwise noted.  DIAGNOSTIC FINDINGS: MRI brain 10/03/23: IMPRESSION: 1. Acute infarct involving the posterior aspect of the right putamen, posterior aspect of the body of the right caudate nucleus and adjacent white matter with associated petechial hemorrhage within the right caudate body. 2. Other punctate foci of restricted diffusion within the right frontal white matter and right postcentral gyrus, consistent with acute infarcts. 3. Scattered and confluent foci of  T2 hyperintensity within the white matter of the cerebral hemispheres, nonspecific but may represent chronic microvascular ischemic changes.  COGNITION: Overall cognitive status: Within functional limits for tasks assessed   SENSATION: Light touch: WFL  Reports some slight tingling in R and L toes   COORDINATION: Heel to shin: Slightly slower with LLE   POSTURE: No Significant postural limitations   LOWER EXTREMITY MMT:    MMT Right Eval Left Eval  Hip flexion 5 4-  Hip extension    Hip abduction 5 4+  Hip adduction 5 5  Hip internal rotation    Hip external rotation    Knee flexion 5 4-  Knee extension 5 4-  Ankle dorsiflexion 4+ 4+  Ankle plantarflexion    Ankle inversion    Ankle eversion    (Blank rows = not tested)  All tested in sitting   BED MOBILITY:  Pt reports since CVA it is slower, but not difficult  TRANSFERS: Assistive device utilized: None  Sit to stand: SBA Stand to sit: SBA Performed with no UE support, equal weight bearing through BLE  STAIRS: Level of Assistance: SBA Stair Negotiation Technique: Step to Pattern Forwards with Bilateral Rails Number of Stairs: 4  Height of Stairs: 6"  Comments: Prior to CVA, pt did not have to use a handrail   GAIT: Gait pattern: decreased step length- Right, decreased stance time- Left, decreased stride length, and decreased hip/knee flexion- Left, unsteady gait  Distance walked: Clinic distances  Assistive device utilized: None Level of assistance: CGA Comments: Close CGA with no AD due to unsteadiness  Pt ambulating in and out of clinic with walker (no wheels) with mod I   FUNCTIONAL TESTS:  5 times sit to stand: 18.9 seconds with no UE support  Timed up and go (TUG): 13 seconds with no AD with CGA 10 meter walk test: 17.85 seconds with no AD and CGA = 1.84 ft/sec  TREATMENT DATE:   Therapeutic Activity:  Pt ambulating into and out of clinic with RW mod I Small distances in clinic with no AD with supervision  Practiced with SPC with initial cues for sequencing, pt able to ambulate mod I for 230'.  Discussed pt can use this when legs are more fatigued instead of the RW. Pt initially with difficulty with coordinating in the hospital and didn't initially want to use it, but felt better with it today. Pt going to ask a family friend to see if they have one for her to borrow.   NMR:  On blue foam beam: 2 x 10 reps sit <> stands with no UE support for immediate standing balance  Holding 4# medicine ball and tossing it to floor and catching it for reactive balance, performed with feet apart 2 x 10 reps  Side stepping down and back x3 reps with focus on foot clearance   On rockerboard in A/P direction: Alternating SLS taps to 2 cones 10 reps each side, then forward and cross body taps 10 reps each side with no UE support  Head turns 2  x 10 reps, head nods 2 x 10 reps, utilizing hip/ankle strategy for balance, head nods more steady than head turns   Therapeutic Exercise: With red t-band around ankles: 2 x 10 reps hip ABD, progressed as HEP  Standing marching with red t-band around feet 2 x 10 reps, progressed as HEP    PATIENT EDUCATION: Education details: HEP progressions - use of red t-band for standing hip ABD and marching for resistance, how to use a SPC  Person educated: Patient Education method: Explanation, Demonstration, Verbal cues, and Handouts Education comprehension: verbalized understanding and returned demonstration  HOME EXERCISE PROGRAM: Access Code: [email or web address] URL: [email or web address] Date: 11/04/2023 Prepared by: Sherlie Ban  Exercises - Single Leg Bridge  - 1 x daily - 5 x weekly - 1-2 sets - 10 reps - Small Range Straight Leg Raise  - 2 x daily - 5 x weekly - 2 sets - 5 reps - Sit to Stand  - 2 x daily - 5 x  weekly - 1 sets - 10 reps - Standing March with Counter Support  - 1-2 x daily - 5 x weekly - 1-2 sets - 10 reps - progressed to red t-band - Standing Hip Abduction with Counter Support  - 1-2 x daily - 5 x weekly - 1-2 sets - 10 reps - progressed to red t-band - Tandem Walking with Counter Support  - 1-2 x daily - 5 x weekly - 3 sets  - Romberg Stance Eyes Closed on Foam Pad  - 1-2 x daily - 5 x weekly - 3 sets - 30 hold  GOALS: Goals reviewed with patient? Yes  SHORT TERM GOALS: Target date: 11/07/2023  Pt will be independent with initial HEP in order to build upon functional gains made in therapy. Baseline: Goal status: MET  2.  Pt will improve FGA to at least a 15/30 in order to demo decr fall risk.  Baseline: 11/30  24/30 (2/14) Goal status: MET  3.  Pt will improve gait speed with no AD vs. LRAD to at least 2.2 ft/sec in order to demo improved community mobility.   Baseline: 17.85 seconds with no AD and CGA = 1.84 ft/sec    9.94 seconds with no AD = 3.29 ft/sec Goal status: MET  4.  Pt will ambulate at least 69' with no AD with supervision in order to demo  improved functional mobility.  Baseline: currently needs CGA with no AD   Able to walk 345' with supervision  Goal status: MET    LONG TERM GOALS: Target date: 12/05/2023  Pt will be independent with final HEP in order to build upon functional gains made in therapy. Baseline:  Goal status: INITIAL  2.  Pt will improve FGA to at least a 26/30 in order to demo decr fall risk. Baseline: 11/30  24/30 (2/14) Goal status: REVISED  3.   Pt will improve gait speed with no AD vs. LRAD to at least 2.7 ft/sec in order to demo improved community mobility.  Baseline: 17.85 seconds with no AD and CGA = 1.84 ft/sec    9.94 seconds with no AD = 3.29 ft/sec (2/14) Goal status: MET  4.  Pt will improve 5x sit<>stand to less than or equal to 14 sec with no UE support to demonstrate improved functional strength and transfer  efficiency.   Baseline: 18.9 seconds with no UE support Goal status: INITIAL  5.  Pt will improve TUG time to 10 seconds or less with supervision and no AD in order to demo decrease fall risk. Baseline: 13 seconds and CGA Goal status: INITIAL   ASSESSMENT:  CLINICAL IMPRESSION: Today's skilled session focused on balance strategies on compliant surfaces, BLE strengthening. Pt with incr postural sway on blue foam beam, but did improve with incr reps with balance. Upgraded pt's hip ABD and marching exercises with red t-band around ankles/feet for HEP. Pt fatigued more easily with LLE, but tolerated well.  Will continue per POC.   OBJECTIVE IMPAIRMENTS: Abnormal gait, decreased activity tolerance, decreased balance, decreased coordination, difficulty walking, and decreased strength.   ACTIVITY LIMITATIONS: carrying, lifting, standing, stairs, transfers, bathing, and locomotion level  PARTICIPATION LIMITATIONS: meal prep, driving, shopping, and community activity  PERSONAL FACTORS: Age, Past/current experiences, Time since onset of injury/illness/exacerbation, Transportation, and 1-2 comorbidities:  HLD HTN   are also affecting patient's functional outcome.   REHAB POTENTIAL: Good  CLINICAL DECISION MAKING: Stable/uncomplicated  EVALUATION COMPLEXITY: Low  PLAN:  PT FREQUENCY: 1x/week - due to family preference/transportation  PT DURATION: 8 weeks  PLANNED INTERVENTIONS: 97164- PT Re-evaluation, 97110-Therapeutic exercises, 97530- Therapeutic activity, [id number]- Neuromuscular re-education, 97535- Self Care, 24401- Manual therapy, [id number]- Gait training, Patient/Family education, Balance training, Stair training, and DME instructions  PLAN FOR NEXT SESSION:  LLE strength, standing balance tasks on compliant surfaces, dynamic gait tasks with no AD    Drake Leach, PT, DPT 11/11/2023, 3:57 PM

## 2023-11-17 ENCOUNTER — Ambulatory Visit: Payer: Medicare Other | Admitting: Physical Therapy

## 2023-11-17 VITALS — BP 150/73 | HR 82

## 2023-11-17 DIAGNOSIS — R2681 Unsteadiness on feet: Secondary | ICD-10-CM

## 2023-11-17 DIAGNOSIS — R2689 Other abnormalities of gait and mobility: Secondary | ICD-10-CM | POA: Diagnosis not present

## 2023-11-17 DIAGNOSIS — M6281 Muscle weakness (generalized): Secondary | ICD-10-CM | POA: Diagnosis not present

## 2023-11-17 NOTE — Therapy (Signed)
 OUTPATIENT PHYSICAL THERAPY NEURO TREATMENT   Patient Name: Kristy [name] MRN: [id number] DOB:1946-07-14, 78 y.o., female Today's Date: 11/17/2023   PCP: Thana Ates, MD  REFERRING PROVIDER: Micki Riley, MD    END OF SESSION:  PT End of Session - 11/17/23 1526     Visit Number 6    Number of Visits 9    Date for PT Re-Evaluation 12/09/23    Authorization Type MEDICARE PART A AND B    PT Start Time 1524    PT Stop Time 1604    PT Time Calculation (min) 40 min    Equipment Utilized During Treatment Gait belt    Activity Tolerance Patient tolerated treatment well    Behavior During Therapy WFL for tasks assessed/performed              Past Medical History:  Diagnosis Date   Allergy    Anemia    Hyperlipidemia    Hypertension    Past Surgical History:  Procedure Laterality Date   ABDOMINAL HYSTERECTOMY     LOOP RECORDER INSERTION N/A 10/04/2023   Procedure: LOOP RECORDER INSERTION;  Surgeon: Graciella Freer, PA-C;  Location: [facility];  Service: Cardiovascular;  Laterality: N/A;   PARTIAL HYSTERECTOMY     TUBAL LIGATION     Patient Active Problem List   Diagnosis Date Noted   Acute ischemic stroke (HCC) 10/02/2023   Screening for colon cancer 08/31/2018   Encounter for medication monitoring 08/31/2018   Dyslipidemia 08/31/2018   Dizziness 08/31/2018   Essential hypertension 08/31/2018   Tinnitus 08/05/2017   Paresthesia 08/05/2017   History of irregular heartbeat 08/05/2017   Allergic rhinitis 05/16/2014   Sleep pattern disturbance 11/23/2013   Dyspepsia 05/30/2013   Hypercholesteremia 04/05/2012    ONSET DATE: 10/04/2023  REFERRING DIAG: I63.9 (ICD-10-CM) - Acute ischemic stroke (HCC)  THERAPY DIAG:  Unsteadiness on feet  Other abnormalities of gait and mobility  Muscle weakness (generalized)  Rationale for Evaluation and Treatment: Rehabilitation  SUBJECTIVE:                                                                                                                                                                                              SUBJECTIVE STATEMENT: Patient arrives to session with SPC. She reports that her son bought it for her and she has been using it mostly at home. She sometimes will use RW but has mostly switched. Denies falls and near falls.   Pt accompanied by: self  PERTINENT HISTORY: Pt admitted 10/02/23 with L side weakness. Pt with Acute Ischemic Infarct: Small acute infarcts  in the posterior aspect of the right putamen, posterior aspect of the right caudate nucleus and adjacent white matter s/p TNK. Etiology: Likely embolic from cryptogenic source given large size of the subcortical infarct. Loop record implanted  Was discharged home from the hospital on 10/04/23.   PMH:  HLD HTN   PAIN:  Are you having pain? No  Vitals:   11/17/23 1529  BP: (!) 150/73  Pulse: 82    PRECAUTIONS: Fall  WEIGHT BEARING RESTRICTIONS: No  FALLS: Has patient fallen in last 6 months? No  LIVING ENVIRONMENT: Lives with: lives with their son Lives in: House/apartment Stairs: Yes: External: 4 steps; on right going up Has following equipment at home:  Walker with no wheels   PLOF: Independent and Leisure: Enjoys reading   PATIENT GOALS: Wants to improve balance on her L side   OBJECTIVE:  Note: Objective measures were completed at Evaluation unless otherwise noted.  DIAGNOSTIC FINDINGS: MRI brain 10/03/23: IMPRESSION: 1. Acute infarct involving the posterior aspect of the right putamen, posterior aspect of the body of the right caudate nucleus and adjacent white matter with associated petechial hemorrhage within the right caudate body. 2. Other punctate foci of restricted diffusion within the right frontal white matter and right postcentral gyrus, consistent with acute infarcts. 3. Scattered and confluent foci of T2 hyperintensity within the white matter of the cerebral  hemispheres, nonspecific but may represent chronic microvascular ischemic changes.  COGNITION: Overall cognitive status: Within functional limits for tasks assessed   SENSATION: Light touch: WFL  Reports some slight tingling in R and L toes   COORDINATION: Heel to shin: Slightly slower with LLE   POSTURE: No Significant postural limitations   LOWER EXTREMITY MMT:    MMT Right Eval Left Eval  Hip flexion 5 4-  Hip extension    Hip abduction 5 4+  Hip adduction 5 5  Hip internal rotation    Hip external rotation    Knee flexion 5 4-  Knee extension 5 4-  Ankle dorsiflexion 4+ 4+  Ankle plantarflexion    Ankle inversion    Ankle eversion    (Blank rows = not tested)  All tested in sitting   BED MOBILITY:  Pt reports since CVA it is slower, but not difficult  TRANSFERS: Assistive device utilized: None  Sit to stand: SBA Stand to sit: SBA Performed with no UE support, equal weight bearing through BLE  STAIRS: Level of Assistance: SBA Stair Negotiation Technique: Step to Pattern Forwards with Bilateral Rails Number of Stairs: 4  Height of Stairs: 6"  Comments: Prior to CVA, pt did not have to use a handrail   GAIT: Gait pattern: decreased step length- Right, decreased stance time- Left, decreased stride length, and decreased hip/knee flexion- Left, unsteady gait  Distance walked: Clinic distances  Assistive device utilized: None Level of assistance: CGA Comments: Close CGA with no AD due to unsteadiness  Pt ambulating in and out of clinic with walker (no wheels) with mod I   FUNCTIONAL TESTS:  5 times sit to stand: 18.9 seconds with no UE support  Timed up and go (TUG): 13 seconds with no AD with CGA 10 meter walk test: 17.85 seconds with no AD and CGA = 1.84 ft/sec  TREATMENT DATE:   Self Care:  Vitals:   11/17/23 1529   BP: (!) 150/73  Pulse: 82   Assessed seated on RUE, elevated but WFL for therapy, patient reports history of white coat syndrome, continue to emphasize close monitoring at home which patient reports she is doing   NMR: 5lb ankle weights donned bilaterally for improved proprioceptive input and strength work, donned after gait noted below Forward gait 1 x 230' with use of SBC for sequencing training (CGA) Cues for increasing foot clearance Backward gait 4 x 20' without AD (CGA with minA 1-2x due to instability, cues for large steps) Lateral side stepping 2 x 20' each direction with CGA Obstacle course for dynamic balance with CGA (minA 1-3x with horizontal head turn challenge: patient crossed balance beam, step up on to rockerboard, step across black foams, and onto airex 3 rounds with cues only on slowing pace 2 rounds adding horizontal head turns each step (most challenging) 2 rounds adding vertical head turns (easier)  TherAct:  For strength, transfers, and stability Sit to stand from low mat without UE use and standing on black foam x 8 attempts (4 successful, challenge maintain balance and eccentric lower, CGA with minA from PT for balance correction on unsuccessful attempts) Sit to stand with 10lb kettle bell chest hold from low mat standing on blue airex 3 x 10 reps   PATIENT EDUCATION: Education details: Continue HEP Person educated: Patient Education method: Programmer, multimedia, Demonstration, Verbal cues, and Handouts Education comprehension: verbalized understanding and returned demonstration  HOME EXERCISE PROGRAM: Access Code: [email or web address] URL: [email or web address] Date: 11/04/2023 Prepared by: Sherlie Ban  Exercises - Single Leg Bridge  - 1 x daily - 5 x weekly - 1-2 sets - 10 reps - Small Range Straight Leg Raise  - 2 x daily - 5 x weekly - 2 sets - 5 reps - Sit to Stand  - 2 x daily - 5 x weekly - 1 sets - 10 reps - Standing March with Counter Support  -  1-2 x daily - 5 x weekly - 1-2 sets - 10 reps - progressed to red t-band - Standing Hip Abduction with Counter Support  - 1-2 x daily - 5 x weekly - 1-2 sets - 10 reps - progressed to red t-band - Tandem Walking with Counter Support  - 1-2 x daily - 5 x weekly - 3 sets  - Romberg Stance Eyes Closed on Foam Pad  - 1-2 x daily - 5 x weekly - 3 sets - 30 hold  GOALS: Goals reviewed with patient? Yes  SHORT TERM GOALS: Target date: 11/07/2023  Pt will be independent with initial HEP in order to build upon functional gains made in therapy. Baseline: Goal status: MET  2.  Pt will improve FGA to at least a 15/30 in order to demo decr fall risk.  Baseline: 11/30  24/30 (2/14) Goal status: MET  3.  Pt will improve gait speed with no AD vs. LRAD to at least 2.2 ft/sec in order to demo improved community mobility.   Baseline: 17.85 seconds with no AD and CGA = 1.84 ft/sec    9.94 seconds with no AD = 3.29 ft/sec Goal status: MET  4.  Pt will ambulate at least 91' with no AD with supervision in order to demo improved functional mobility.  Baseline: currently needs CGA with no AD   Able to walk 345' with supervision  Goal status: MET    LONG TERM GOALS: Target date: 12/05/2023  Pt will be independent with final HEP in order to build upon functional gains made in therapy. Baseline:  Goal status: INITIAL  2.  Pt will improve FGA to at least a 26/30 in order to demo decr fall risk. Baseline: 11/30  24/30 (2/14) Goal status: REVISED  3.   Pt will improve gait speed with no AD vs. LRAD to at least 2.7 ft/sec in order to demo improved community mobility.  Baseline: 17.85 seconds with no AD and CGA = 1.84 ft/sec    9.94 seconds with no AD = 3.29 ft/sec (2/14) Goal status: MET  4.  Pt will improve 5x sit<>stand to less than or equal to 14 sec with no UE support to demonstrate improved functional strength and transfer efficiency.   Baseline: 18.9 seconds with no UE support Goal  status: INITIAL  5.  Pt will improve TUG time to 10 seconds or less with supervision and no AD in order to demo decrease fall risk. Baseline: 13 seconds and CGA Goal status: INITIAL   ASSESSMENT:  CLINICAL IMPRESSION: Today's skilled session focused on complex balance and strength tasks with multidirectional gait, obstacle courses, and transfer work on compliant surface. Patient performed very high level tasks with only intermittent minA for correction of balance. Backwards gait and very compliant surfaces were most challenging today.  Will continue per POC.   OBJECTIVE IMPAIRMENTS: Abnormal gait, decreased activity tolerance, decreased balance, decreased coordination, difficulty walking, and decreased strength.   ACTIVITY LIMITATIONS: carrying, lifting, standing, stairs, transfers, bathing, and locomotion level  PARTICIPATION LIMITATIONS: meal prep, driving, shopping, and community activity  PERSONAL FACTORS: Age, Past/current experiences, Time since onset of injury/illness/exacerbation, Transportation, and 1-2 comorbidities:  HLD HTN   are also affecting patient's functional outcome.   REHAB POTENTIAL: Good  CLINICAL DECISION MAKING: Stable/uncomplicated  EVALUATION COMPLEXITY: Low  PLAN:  PT FREQUENCY: 1x/week - due to family preference/transportation  PT DURATION: 8 weeks  PLANNED INTERVENTIONS: 97164- PT Re-evaluation, 97110-Therapeutic exercises, 97530- Therapeutic activity, 97112- Neuromuscular re-education, 97535- Self Care, 86578- Manual therapy, [phone]- Gait training, Patient/Family education, Balance training, Stair training, and DME instructions  PLAN FOR NEXT SESSION:  LLE strength, standing balance tasks on compliant surfaces, dynamic gait tasks with no AD, minisquat on upside down BOSU with squigz reaching to mirror, step backs off of black foam     Carmelia Bake, PT, DPT 11/17/2023, 4:23 PM

## 2023-11-18 DIAGNOSIS — Z79899 Other long term (current) drug therapy: Secondary | ICD-10-CM | POA: Diagnosis not present

## 2023-11-18 DIAGNOSIS — G2581 Restless legs syndrome: Secondary | ICD-10-CM | POA: Diagnosis not present

## 2023-11-18 DIAGNOSIS — Z8673 Personal history of transient ischemic attack (TIA), and cerebral infarction without residual deficits: Secondary | ICD-10-CM | POA: Diagnosis not present

## 2023-11-25 ENCOUNTER — Ambulatory Visit: Payer: Medicare Other | Attending: Neurology | Admitting: Physical Therapy

## 2023-11-25 ENCOUNTER — Encounter: Payer: Self-pay | Admitting: Physical Therapy

## 2023-11-25 VITALS — BP 128/77 | HR 72

## 2023-11-25 DIAGNOSIS — M6281 Muscle weakness (generalized): Secondary | ICD-10-CM | POA: Insufficient documentation

## 2023-11-25 DIAGNOSIS — R2689 Other abnormalities of gait and mobility: Secondary | ICD-10-CM | POA: Diagnosis not present

## 2023-11-25 DIAGNOSIS — R2681 Unsteadiness on feet: Secondary | ICD-10-CM | POA: Diagnosis not present

## 2023-11-25 NOTE — Therapy (Signed)
 OUTPATIENT PHYSICAL THERAPY NEURO TREATMENT   Patient Name: Kristy [name] MRN: [id number] DOB:09/19/1946, 78 y.o., female Today's Date: 11/25/2023   PCP: Thana Ates, MD  REFERRING PROVIDER: Micki Riley, MD    END OF SESSION:  PT End of Session - 11/25/23 1523     Visit Number 7    Number of Visits 9    Date for PT Re-Evaluation 12/09/23    Authorization Type MEDICARE PART A AND B    PT Start Time 1522    PT Stop Time 1603    PT Time Calculation (min) 41 min    Equipment Utilized During Treatment Gait belt    Activity Tolerance Patient tolerated treatment well    Behavior During Therapy WFL for tasks assessed/performed              Past Medical History:  Diagnosis Date   Allergy    Anemia    Hyperlipidemia    Hypertension    Past Surgical History:  Procedure Laterality Date   ABDOMINAL HYSTERECTOMY     LOOP RECORDER INSERTION N/A 10/04/2023   Procedure: LOOP RECORDER INSERTION;  Surgeon: Graciella Freer, PA-C;  Location: [facility];  Service: Cardiovascular;  Laterality: N/A;   PARTIAL HYSTERECTOMY     TUBAL LIGATION     Patient Active Problem List   Diagnosis Date Noted   Acute ischemic stroke (HCC) 10/02/2023   Screening for colon cancer 08/31/2018   Encounter for medication monitoring 08/31/2018   Dyslipidemia 08/31/2018   Dizziness 08/31/2018   Essential hypertension 08/31/2018   Tinnitus 08/05/2017   Paresthesia 08/05/2017   History of irregular heartbeat 08/05/2017   Allergic rhinitis 05/16/2014   Sleep pattern disturbance 11/23/2013   Dyspepsia 05/30/2013   Hypercholesteremia 04/05/2012    ONSET DATE: 10/04/2023  REFERRING DIAG: I63.9 (ICD-10-CM) - Acute ischemic stroke (HCC)  THERAPY DIAG:  Unsteadiness on feet  Other abnormalities of gait and mobility  Muscle weakness (generalized)  Rationale for Evaluation and Treatment: Rehabilitation  SUBJECTIVE:                                                                                                                                                                                              SUBJECTIVE STATEMENT: Has been using the cane some since she was last here. Was sore after last session. Haven't been wobbling like she used to. Has been working on heel toe at home and that has been getting better. Feels 90% back to herself, when walking still feel like she is walking on a sponge.   Pt accompanied by: self  PERTINENT HISTORY:  Pt admitted 10/02/23 with L side weakness. Pt with Acute Ischemic Infarct: Small acute infarcts in the posterior aspect of the right putamen, posterior aspect of the right caudate nucleus and adjacent white matter s/p TNK. Etiology: Likely embolic from cryptogenic source given large size of the subcortical infarct. Loop record implanted  Was discharged home from the hospital on 10/04/23.   PMH:  HLD HTN   PAIN:  Are you having pain? No  Vitals:   11/25/23 1526  BP: 128/77  Pulse: 72     PRECAUTIONS: Fall  WEIGHT BEARING RESTRICTIONS: No  FALLS: Has patient fallen in last 6 months? No  LIVING ENVIRONMENT: Lives with: lives with their son Lives in: House/apartment Stairs: Yes: External: 4 steps; on right going up Has following equipment at home:  Walker with no wheels   PLOF: Independent and Leisure: Enjoys reading   PATIENT GOALS: Wants to improve balance on her L side   OBJECTIVE:  Note: Objective measures were completed at Evaluation unless otherwise noted.  DIAGNOSTIC FINDINGS: MRI brain 10/03/23: IMPRESSION: 1. Acute infarct involving the posterior aspect of the right putamen, posterior aspect of the body of the right caudate nucleus and adjacent white matter with associated petechial hemorrhage within the right caudate body. 2. Other punctate foci of restricted diffusion within the right frontal white matter and right postcentral gyrus, consistent with acute infarcts. 3. Scattered and confluent foci  of T2 hyperintensity within the white matter of the cerebral hemispheres, nonspecific but may represent chronic microvascular ischemic changes.  COGNITION: Overall cognitive status: Within functional limits for tasks assessed   SENSATION: Light touch: WFL  Reports some slight tingling in R and L toes   COORDINATION: Heel to shin: Slightly slower with LLE   POSTURE: No Significant postural limitations   LOWER EXTREMITY MMT:    MMT Right Eval Left Eval  Hip flexion 5 4-  Hip extension    Hip abduction 5 4+  Hip adduction 5 5  Hip internal rotation    Hip external rotation    Knee flexion 5 4-  Knee extension 5 4-  Ankle dorsiflexion 4+ 4+  Ankle plantarflexion    Ankle inversion    Ankle eversion    (Blank rows = not tested)  All tested in sitting   BED MOBILITY:  Pt reports since CVA it is slower, but not difficult  TRANSFERS: Assistive device utilized: None  Sit to stand: SBA Stand to sit: SBA Performed with no UE support, equal weight bearing through BLE  STAIRS: Level of Assistance: SBA Stair Negotiation Technique: Step to Pattern Forwards with Bilateral Rails Number of Stairs: 4  Height of Stairs: 6"  Comments: Prior to CVA, pt did not have to use a handrail   GAIT: Gait pattern: decreased step length- Right, decreased stance time- Left, decreased stride length, and decreased hip/knee flexion- Left, unsteady gait  Distance walked: Clinic distances  Assistive device utilized: None Level of assistance: CGA Comments: Close CGA with no AD due to unsteadiness  Pt ambulating in and out of clinic with walker (no wheels) with mod I   FUNCTIONAL TESTS:  5 times sit to stand: 18.9 seconds with no UE support  Timed up and go (TUG): 13 seconds with no AD with CGA 10 meter walk test: 17.85 seconds with no AD and CGA = 1.84 ft/sec  TREATMENT DATE:    NMR: On blue foam beam: Tandem gait down and back x5 reps, cues for slowed pace as pt moving fast without use of UE support With 4 larger orange obstacles, side stepping down and back x4 reps, working on slowed pace for SLS, working on ankle/hip strategy for balance without UE support  With rebounder holding red ball, alternating forward stepping strategy and catching ball 10 reps each leg, then performed alternating lateral stepping 10 reps each side for reactive balance, pt needing to utilize hip/ankle strategy when stepping back to midline  On rockerboard in A/P direction: Alternating forward step ups with contralateral march for dynamic SLS, focus on slowed pace 2 x 10 reps each side  On black side of BOSU:  Static stance 2 x 30 seconds, needing intermittent UE support, progressed to 2 x 10 reps alternating UE lifts overhead, cues for core activation and slowed pace, pt able to perform with no UE support Lateral weight shifting 10 reps with fingertip support, pt very shaky and off balance with this when trying with no UE support  Retro gait while holding purple ball 3 x 30', last 2 reps performed with dual tasking with pt naming animals in alphabetical order, needing intermittent cues to continue stepping Holding SLS and tossing purple ball to floor and catching it 2 x 10 reps each leg, more difficulty with SLS on R leg   PATIENT EDUCATION: Education details: Continue HEP Person educated: Patient Education method: Explanation Education comprehension: verbalized understanding and returned demonstration  HOME EXERCISE PROGRAM: Access Code: [email or web address] URL: [email or web address] Date: 11/04/2023 Prepared by: Sherlie Ban  Exercises - Single Leg Bridge  - 1 x daily - 5 x weekly - 1-2 sets - 10 reps - Small Range Straight Leg Raise  - 2 x daily - 5 x weekly - 2 sets - 5 reps - Sit to Stand  - 2 x daily - 5 x weekly - 1 sets - 10 reps - Standing March  with Counter Support  - 1-2 x daily - 5 x weekly - 1-2 sets - 10 reps - progressed to red t-band - Standing Hip Abduction with Counter Support  - 1-2 x daily - 5 x weekly - 1-2 sets - 10 reps - progressed to red t-band - Tandem Walking with Counter Support  - 1-2 x daily - 5 x weekly - 3 sets  - Romberg Stance Eyes Closed on Foam Pad  - 1-2 x daily - 5 x weekly - 3 sets - 30 hold  GOALS: Goals reviewed with patient? Yes  SHORT TERM GOALS: Target date: 11/07/2023  Pt will be independent with initial HEP in order to build upon functional gains made in therapy. Baseline: Goal status: MET  2.  Pt will improve FGA to at least a 15/30 in order to demo decr fall risk.  Baseline: 11/30  24/30 (2/14) Goal status: MET  3.  Pt will improve gait speed with no AD vs. LRAD to at least 2.2 ft/sec in order to demo improved community mobility.   Baseline: 17.85 seconds with no AD and CGA = 1.84 ft/sec    9.94 seconds with no AD = 3.29 ft/sec Goal status: MET  4.  Pt will ambulate at least 73' with no AD with supervision in order to demo improved functional mobility.  Baseline: currently needs CGA with no AD   Able to walk 345' with supervision  Goal status: MET    LONG TERM GOALS: Target date: 12/05/2023  Pt will be independent with final HEP in order to build upon functional gains made in therapy. Baseline:  Goal status: INITIAL  2.  Pt will improve FGA to at least a 26/30 in order to demo decr fall risk. Baseline: 11/30  24/30 (2/14) Goal status: REVISED  3.   Pt will improve gait speed with no AD vs. LRAD to at least 2.7 ft/sec in order to demo improved community mobility.  Baseline: 17.85 seconds with no AD and CGA = 1.84 ft/sec    9.94 seconds with no AD = 3.29 ft/sec (2/14) Goal status: MET  4.  Pt will improve 5x sit<>stand to less than or equal to 14 sec with no UE support to demonstrate improved functional strength and transfer efficiency.   Baseline: 18.9 seconds with  no UE support Goal status: INITIAL  5.  Pt will improve TUG time to 10 seconds or less with supervision and no AD in order to demo decrease fall risk. Baseline: 13 seconds and CGA Goal status: INITIAL   ASSESSMENT:  CLINICAL IMPRESSION: Today's skilled session focused on high level balance tasks on compliant surfaces working on decr UE support. Pt most challenged with BOSU tasks for balance, needs frequent UE support. Pt is making excellent progress, will continue per POC.   OBJECTIVE IMPAIRMENTS: Abnormal gait, decreased activity tolerance, decreased balance, decreased coordination, difficulty walking, and decreased strength.   ACTIVITY LIMITATIONS: carrying, lifting, standing, stairs, transfers, bathing, and locomotion level  PARTICIPATION LIMITATIONS: meal prep, driving, shopping, and community activity  PERSONAL FACTORS: Age, Past/current experiences, Time since onset of injury/illness/exacerbation, Transportation, and 1-2 comorbidities:  HLD HTN   are also affecting patient's functional outcome.   REHAB POTENTIAL: Good  CLINICAL DECISION MAKING: Stable/uncomplicated  EVALUATION COMPLEXITY: Low  PLAN:  PT FREQUENCY: 1x/week - due to family preference/transportation  PT DURATION: 8 weeks  PLANNED INTERVENTIONS: 97164- PT Re-evaluation, 97110-Therapeutic exercises, 97530- Therapeutic activity, 97112- Neuromuscular re-education, 97535- Self Care, 16109- Manual therapy, [phone]- Gait training, Patient/Family education, Balance training, Stair training, and DME instructions  PLAN FOR NEXT SESSION:  LLE strength, standing balance tasks on compliant surfaces, dynamic gait tasks with no AD, try more balance tasks on BOSU, step backs off of black foam     Drake Leach, PT, DPT 11/25/2023, 4:03 PM

## 2023-12-02 ENCOUNTER — Ambulatory Visit: Payer: Medicare Other | Admitting: Physical Therapy

## 2023-12-02 ENCOUNTER — Encounter: Payer: Self-pay | Admitting: Physical Therapy

## 2023-12-02 VITALS — BP 136/81 | HR 90

## 2023-12-02 DIAGNOSIS — R2689 Other abnormalities of gait and mobility: Secondary | ICD-10-CM | POA: Diagnosis not present

## 2023-12-02 DIAGNOSIS — R2681 Unsteadiness on feet: Secondary | ICD-10-CM

## 2023-12-02 DIAGNOSIS — M6281 Muscle weakness (generalized): Secondary | ICD-10-CM | POA: Diagnosis not present

## 2023-12-02 NOTE — Therapy (Signed)
 OUTPATIENT PHYSICAL THERAPY NEURO TREATMENT   Patient Name: Kristy [name] MRN: [id number] DOB:Feb 18, 1946, 78 y.o., female Today's Date: 12/02/2023   PCP: Thana Ates, MD  REFERRING PROVIDER: Micki Riley, MD    END OF SESSION:  PT End of Session - 12/02/23 1522     Visit Number 8    Number of Visits 9    Date for PT Re-Evaluation 12/09/23    Authorization Type MEDICARE PART A AND B    PT Start Time 1521    PT Stop Time 1601    PT Time Calculation (min) 40 min    Equipment Utilized During Treatment Gait belt    Activity Tolerance Patient tolerated treatment well    Behavior During Therapy WFL for tasks assessed/performed              Past Medical History:  Diagnosis Date   Allergy    Anemia    Hyperlipidemia    Hypertension    Past Surgical History:  Procedure Laterality Date   ABDOMINAL HYSTERECTOMY     LOOP RECORDER INSERTION N/A 10/04/2023   Procedure: LOOP RECORDER INSERTION;  Surgeon: Graciella Freer, PA-C;  Location: [facility];  Service: Cardiovascular;  Laterality: N/A;   PARTIAL HYSTERECTOMY     TUBAL LIGATION     Patient Active Problem List   Diagnosis Date Noted   Acute ischemic stroke (HCC) 10/02/2023   Screening for colon cancer 08/31/2018   Encounter for medication monitoring 08/31/2018   Dyslipidemia 08/31/2018   Dizziness 08/31/2018   Essential hypertension 08/31/2018   Tinnitus 08/05/2017   Paresthesia 08/05/2017   History of irregular heartbeat 08/05/2017   Allergic rhinitis 05/16/2014   Sleep pattern disturbance 11/23/2013   Dyspepsia 05/30/2013   Hypercholesteremia 04/05/2012    ONSET DATE: 10/04/2023  REFERRING DIAG: I63.9 (ICD-10-CM) - Acute ischemic stroke (HCC)  THERAPY DIAG:  Unsteadiness on feet  Other abnormalities of gait and mobility  Muscle weakness (generalized)  Rationale for Evaluation and Treatment: Rehabilitation  SUBJECTIVE:                                                                                                                                                                                              SUBJECTIVE STATEMENT: Was feeling a little cloudy earlier, but feeling better now. Has been practicing heel to toe at home. Has been trying to get outside more.   Pt accompanied by: self  PERTINENT HISTORY: Pt admitted 10/02/23 with L side weakness. Pt with Acute Ischemic Infarct: Small acute infarcts in the posterior aspect of the right putamen, posterior aspect of the right  caudate nucleus and adjacent white matter s/p TNK. Etiology: Likely embolic from cryptogenic source given large size of the subcortical infarct. Loop record implanted  Was discharged home from the hospital on 10/04/23.   PMH:  HLD HTN   PAIN:  Are you having pain? No  Vitals:   12/02/23 1526  BP: 136/81  Pulse: 90      PRECAUTIONS: Fall  WEIGHT BEARING RESTRICTIONS: No  FALLS: Has patient fallen in last 6 months? No  LIVING ENVIRONMENT: Lives with: lives with their son Lives in: House/apartment Stairs: Yes: External: 4 steps; on right going up Has following equipment at home:  Walker with no wheels   PLOF: Independent and Leisure: Enjoys reading   PATIENT GOALS: Wants to improve balance on her L side   OBJECTIVE:  Note: Objective measures were completed at Evaluation unless otherwise noted.  DIAGNOSTIC FINDINGS: MRI brain 10/03/23: IMPRESSION: 1. Acute infarct involving the posterior aspect of the right putamen, posterior aspect of the body of the right caudate nucleus and adjacent white matter with associated petechial hemorrhage within the right caudate body. 2. Other punctate foci of restricted diffusion within the right frontal white matter and right postcentral gyrus, consistent with acute infarcts. 3. Scattered and confluent foci of T2 hyperintensity within the white matter of the cerebral hemispheres, nonspecific but may represent chronic microvascular ischemic  changes.  COGNITION: Overall cognitive status: Within functional limits for tasks assessed   SENSATION: Light touch: WFL  Reports some slight tingling in R and L toes   COORDINATION: Heel to shin: Slightly slower with LLE   POSTURE: No Significant postural limitations   LOWER EXTREMITY MMT:    MMT Right Eval Left Eval  Hip flexion 5 4-  Hip extension    Hip abduction 5 4+  Hip adduction 5 5  Hip internal rotation    Hip external rotation    Knee flexion 5 4-  Knee extension 5 4-  Ankle dorsiflexion 4+ 4+  Ankle plantarflexion    Ankle inversion    Ankle eversion    (Blank rows = not tested)  All tested in sitting   BED MOBILITY:  Pt reports since CVA it is slower, but not difficult  TRANSFERS: Assistive device utilized: None  Sit to stand: SBA Stand to sit: SBA Performed with no UE support, equal weight bearing through BLE  STAIRS: Level of Assistance: SBA Stair Negotiation Technique: Step to Pattern Forwards with Bilateral Rails Number of Stairs: 4  Height of Stairs: 6"  Comments: Prior to CVA, pt did not have to use a handrail   GAIT: Gait pattern: decreased step length- Right, decreased stance time- Left, decreased stride length, and decreased hip/knee flexion- Left, unsteady gait  Distance walked: Clinic distances  Assistive device utilized: None Level of assistance: CGA Comments: Close CGA with no AD due to unsteadiness  Pt ambulating in and out of clinic with walker (no wheels) with mod I   FUNCTIONAL TESTS:  5 times sit to stand: 18.9 seconds with no UE support  Timed up and go (TUG): 13 seconds with no AD with CGA 10 meter walk test: 17.85 seconds with no AD and CGA = 1.84 ft/sec  TREATMENT DATE:   Vitals:   12/02/23 1526  BP: 136/81  Pulse: 90     Therapeutic Exercise: SciFit with BUE/BLE Multi-peaks  at Gear 4.5 for 8 minutes for strengthening, activity tolerance/endurance. Pt reporting RPE at 6/10   NMR:  With 6 blaze pods on steps (3 on first step, 3 on 2nd step) for improved SLS time, foot clearance, weight shifting, and coordination. Performed on blue foam beam on random hit setting. Performed with no UE support  Performed 3 bouts of 1 minute each: 23 hits, 31 hits, 33 hits  On air thicker blue foam: Slow soccer ball rolls for SLS, 4 x 5 reps each side, cued for slowed pace, pt improved with incr reps With feet hip width: EC 3 x 30 seconds, mild postural sway  Gait outdoors on grass with no AD  300' gait with supervision 2 x 30' gait with head turns, 2 x 30' gait with head nods with SBA 2 x 30' slow marching for dynamic SLS, SBA/CGA, cues for slowed pace     PATIENT EDUCATION: Education details: Continue HEP, plan for D/C at next session due to progress  Person educated: Patient Education method: Explanation Education comprehension: verbalized understanding and returned demonstration  HOME EXERCISE PROGRAM: Access Code: [email or web address] URL: [email or web address] Date: 11/04/2023 Prepared by: Sherlie Ban  Exercises - Single Leg Bridge  - 1 x daily - 5 x weekly - 1-2 sets - 10 reps - Small Range Straight Leg Raise  - 2 x daily - 5 x weekly - 2 sets - 5 reps - Sit to Stand  - 2 x daily - 5 x weekly - 1 sets - 10 reps - Standing March with Counter Support  - 1-2 x daily - 5 x weekly - 1-2 sets - 10 reps - progressed to red t-band - Standing Hip Abduction with Counter Support  - 1-2 x daily - 5 x weekly - 1-2 sets - 10 reps - progressed to red t-band - Tandem Walking with Counter Support  - 1-2 x daily - 5 x weekly - 3 sets  - Romberg Stance Eyes Closed on Foam Pad  - 1-2 x daily - 5 x weekly - 3 sets - 30 hold  GOALS: Goals reviewed with patient? Yes  SHORT TERM GOALS: Target date: 11/07/2023  Pt will be independent with initial HEP in order to build upon  functional gains made in therapy. Baseline: Goal status: MET  2.  Pt will improve FGA to at least a 15/30 in order to demo decr fall risk.  Baseline: 11/30  24/30 (2/14) Goal status: MET  3.  Pt will improve gait speed with no AD vs. LRAD to at least 2.2 ft/sec in order to demo improved community mobility.   Baseline: 17.85 seconds with no AD and CGA = 1.84 ft/sec    9.94 seconds with no AD = 3.29 ft/sec Goal status: MET  4.  Pt will ambulate at least 46' with no AD with supervision in order to demo improved functional mobility.  Baseline: currently needs CGA with no AD   Able to walk 345' with supervision  Goal status: MET    LONG TERM GOALS: Target date: 12/05/2023  Pt will be independent with final HEP in order to build upon functional gains made in therapy. Baseline:  Goal status: INITIAL  2.  Pt will improve FGA to at least a 26/30 in order to demo decr fall risk. Baseline: 11/30  24/30 (2/14) Goal status: REVISED  3.   Pt will improve gait speed with no AD vs. LRAD to at least 2.7 ft/sec in order to demo improved community mobility.  Baseline: 17.85 seconds with no AD and CGA = 1.84 ft/sec    9.94 seconds with no AD = 3.29 ft/sec (2/14) Goal status: MET  4.  Pt will improve 5x sit<>stand to less than or equal to 14 sec with no UE support to demonstrate improved functional strength and transfer efficiency.   Baseline: 18.9 seconds with no UE support Goal status: INITIAL  5.  Pt will improve TUG time to 10 seconds or less with supervision and no AD in order to demo decrease fall risk. Baseline: 13 seconds and CGA Goal status: INITIAL   ASSESSMENT:  CLINICAL IMPRESSION: Today's skilled session focused on BLE strengthening/endurance on SciFit, balance strategies on compliant surfaces, and gait outdoors with no AD. Pt able to ambulate outdoors with dynamic gait tasks with supervision and no LOB. Pt is making excellent progress, anticipate D/C at next session  with pt in agreement with plan.   OBJECTIVE IMPAIRMENTS: Abnormal gait, decreased activity tolerance, decreased balance, decreased coordination, difficulty walking, and decreased strength.   ACTIVITY LIMITATIONS: carrying, lifting, standing, stairs, transfers, bathing, and locomotion level  PARTICIPATION LIMITATIONS: meal prep, driving, shopping, and community activity  PERSONAL FACTORS: Age, Past/current experiences, Time since onset of injury/illness/exacerbation, Transportation, and 1-2 comorbidities:  HLD HTN   are also affecting patient's functional outcome.   REHAB POTENTIAL: Good  CLINICAL DECISION MAKING: Stable/uncomplicated  EVALUATION COMPLEXITY: Low  PLAN:  PT FREQUENCY: 1x/week - due to family preference/transportation  PT DURATION: 8 weeks  PLANNED INTERVENTIONS: 97164- PT Re-evaluation, 97110-Therapeutic exercises, 97530- Therapeutic activity, [id number]- Neuromuscular re-education, 97535- Self Care, [id number]- Manual therapy, [phone]- Gait training, Patient/Family education, Balance training, Stair training, and DME instructions  PLAN FOR NEXT SESSION: check goals and anticipate D/C     Drake Leach, PT, DPT 12/02/2023, 4:03 PM

## 2023-12-09 ENCOUNTER — Ambulatory Visit: Payer: Medicare Other | Admitting: Physical Therapy

## 2023-12-09 ENCOUNTER — Encounter: Payer: Self-pay | Admitting: Physical Therapy

## 2023-12-09 DIAGNOSIS — R2689 Other abnormalities of gait and mobility: Secondary | ICD-10-CM | POA: Diagnosis not present

## 2023-12-09 DIAGNOSIS — R2681 Unsteadiness on feet: Secondary | ICD-10-CM | POA: Diagnosis not present

## 2023-12-09 DIAGNOSIS — M6281 Muscle weakness (generalized): Secondary | ICD-10-CM

## 2023-12-09 NOTE — Therapy (Signed)
 OUTPATIENT PHYSICAL THERAPY NEURO TREATMENT/DISCHARGE SUMMARY   Patient Name: Kristy [name] MRN: [id number] DOB:01-15-1946, 78 y.o., female Today's Date: 12/09/2023   PCP: Thana Ates, MD  REFERRING PROVIDER: Micki Riley, MD    END OF SESSION:  PT End of Session - 12/09/23 1520     Visit Number 9    Number of Visits 9    Date for PT Re-Evaluation 12/09/23    Authorization Type MEDICARE PART A AND B    PT Start Time 1519    PT Stop Time 1535   full time not used due to D/C visit   PT Time Calculation (min) 16 min    Equipment Utilized During Treatment Gait belt    Activity Tolerance Patient tolerated treatment well    Behavior During Therapy WFL for tasks assessed/performed              Past Medical History:  Diagnosis Date   Allergy    Anemia    Hyperlipidemia    Hypertension    Past Surgical History:  Procedure Laterality Date   ABDOMINAL HYSTERECTOMY     LOOP RECORDER INSERTION N/A 10/04/2023   Procedure: LOOP RECORDER INSERTION;  Surgeon: Graciella Freer, PA-C;  Location: [facility];  Service: Cardiovascular;  Laterality: N/A;   PARTIAL HYSTERECTOMY     TUBAL LIGATION     Patient Active Problem List   Diagnosis Date Noted   Acute ischemic stroke (HCC) 10/02/2023   Screening for colon cancer 08/31/2018   Encounter for medication monitoring 08/31/2018   Dyslipidemia 08/31/2018   Dizziness 08/31/2018   Essential hypertension 08/31/2018   Tinnitus 08/05/2017   Paresthesia 08/05/2017   History of irregular heartbeat 08/05/2017   Allergic rhinitis 05/16/2014   Sleep pattern disturbance 11/23/2013   Dyspepsia 05/30/2013   Hypercholesteremia 04/05/2012    ONSET DATE: 10/04/2023  REFERRING DIAG: I63.9 (ICD-10-CM) - Acute ischemic stroke (HCC)  THERAPY DIAG:  Unsteadiness on feet  Other abnormalities of gait and mobility  Muscle weakness (generalized)  Rationale for Evaluation and Treatment: Rehabilitation  SUBJECTIVE:                                                                                                                                                                                              SUBJECTIVE STATEMENT: Has been working on walking. Ready to graduate today.   Pt accompanied by: self  PERTINENT HISTORY: Pt admitted 10/02/23 with L side weakness. Pt with Acute Ischemic Infarct: Small acute infarcts in the posterior aspect of the right putamen, posterior aspect of the right caudate nucleus and adjacent white matter  s/p TNK. Etiology: Likely embolic from cryptogenic source given large size of the subcortical infarct. Loop record implanted  Was discharged home from the hospital on 10/04/23.   PMH:  HLD HTN   PAIN:  Are you having pain? No  There were no vitals filed for this visit.     PRECAUTIONS: Fall  WEIGHT BEARING RESTRICTIONS: No  FALLS: Has patient fallen in last 6 months? No  LIVING ENVIRONMENT: Lives with: lives with their son Lives in: House/apartment Stairs: Yes: External: 4 steps; on right going up Has following equipment at home:  Walker with no wheels   PLOF: Independent and Leisure: Enjoys reading   PATIENT GOALS: Wants to improve balance on her L side   OBJECTIVE:  Note: Objective measures were completed at Evaluation unless otherwise noted.  DIAGNOSTIC FINDINGS: MRI brain 10/03/23: IMPRESSION: 1. Acute infarct involving the posterior aspect of the right putamen, posterior aspect of the body of the right caudate nucleus and adjacent white matter with associated petechial hemorrhage within the right caudate body. 2. Other punctate foci of restricted diffusion within the right frontal white matter and right postcentral gyrus, consistent with acute infarcts. 3. Scattered and confluent foci of T2 hyperintensity within the white matter of the cerebral hemispheres, nonspecific but may represent chronic microvascular ischemic changes.  COGNITION: Overall  cognitive status: Within functional limits for tasks assessed   SENSATION: Light touch: WFL  Reports some slight tingling in R and L toes   COORDINATION: Heel to shin: Slightly slower with LLE   POSTURE: No Significant postural limitations   LOWER EXTREMITY MMT:    MMT Right Eval Left Eval  Hip flexion 5 4-  Hip extension    Hip abduction 5 4+  Hip adduction 5 5  Hip internal rotation    Hip external rotation    Knee flexion 5 4-  Knee extension 5 4-  Ankle dorsiflexion 4+ 4+  Ankle plantarflexion    Ankle inversion    Ankle eversion    (Blank rows = not tested)  All tested in sitting   BED MOBILITY:  Pt reports since CVA it is slower, but not difficult  TRANSFERS: Assistive device utilized: None  Sit to stand: SBA Stand to sit: SBA Performed with no UE support, equal weight bearing through BLE  STAIRS: Level of Assistance: SBA Stair Negotiation Technique: Step to Pattern Forwards with Bilateral Rails Number of Stairs: 4  Height of Stairs: 6"  Comments: Prior to CVA, pt did not have to use a handrail   GAIT: Gait pattern: decreased step length- Right, decreased stance time- Left, decreased stride length, and decreased hip/knee flexion- Left, unsteady gait  Distance walked: Clinic distances  Assistive device utilized: None Level of assistance: CGA Comments: Close CGA with no AD due to unsteadiness  Pt ambulating in and out of clinic with walker (no wheels) with mod I   FUNCTIONAL TESTS:  5 times sit to stand: 18.9 seconds with no UE support  Timed up and go (TUG): 13 seconds with no AD with CGA 10 meter walk test: 17.85 seconds with no AD and CGA = 1.84 ft/sec  TREATMENT DATE:     Therapeutic Activity: Goal Assessment: 5x sit <> stand: 6.7 seconds with no UE support  TUG: 6.4 with no AD Gait speed: 8.8 seconds with no AD  = 3.72 ft/sec    [facility] PT Assessment - 12/09/23 1527       Functional Gait  Assessment   Gait assessed  Yes    Gait Level Surface Walks 20 ft in less than 5.5 sec, no assistive devices, good speed, no evidence for imbalance, normal gait pattern, deviates no more than 6 in outside of the 12 in walkway width.   5.3   Change in Gait Speed Able to smoothly change walking speed without loss of balance or gait deviation. Deviate no more than 6 in outside of the 12 in walkway width.    Gait with Horizontal Head Turns Performs head turns smoothly with no change in gait. Deviates no more than 6 in outside 12 in walkway width    Gait with Vertical Head Turns Performs head turns with no change in gait. Deviates no more than 6 in outside 12 in walkway width.    Gait and Pivot Turn Pivot turns safely within 3 sec and stops quickly with no loss of balance.    Step Over Obstacle Is able to step over 2 stacked shoe boxes taped together (9 in total height) without changing gait speed. No evidence of imbalance.    Gait with Narrow Base of Support Is able to ambulate for 10 steps heel to toe with no staggering.    Gait with Eyes Closed Walks 20 ft, no assistive devices, good speed, no evidence of imbalance, normal gait pattern, deviates no more than 6 in outside 12 in walkway width. Ambulates 20 ft in less than 7 sec.   5.2   Ambulating Backwards Walks 20 ft, no assistive devices, good speed, no evidence for imbalance, normal gait    Steps Alternating feet, no rail.    Total Score 30    FGA comment: 30/30               PATIENT EDUCATION: Education details: D/C from therapy, results of goals  Person educated: Patient Education method: Explanation Education comprehension: verbalized understanding  HOME EXERCISE PROGRAM: Access Code: [email or web address] URL: [email or web address] Date: 11/04/2023 Prepared by: Sherlie Ban  Exercises - Single Leg Bridge  - 1 x daily - 5 x weekly - 1-2 sets -  10 reps - Small Range Straight Leg Raise  - 2 x daily - 5 x weekly - 2 sets - 5 reps - Sit to Stand  - 2 x daily - 5 x weekly - 1 sets - 10 reps - Standing March with Counter Support  - 1-2 x daily - 5 x weekly - 1-2 sets - 10 reps - progressed to red t-band - Standing Hip Abduction with Counter Support  - 1-2 x daily - 5 x weekly - 1-2 sets - 10 reps - progressed to red t-band - Tandem Walking with Counter Support  - 1-2 x daily - 5 x weekly - 3 sets  - Romberg Stance Eyes Closed on Foam Pad  - 1-2 x daily - 5 x weekly - 3 sets - 30 hold   PHYSICAL THERAPY DISCHARGE SUMMARY  Visits from Start of Care: 9  Current functional level related to goals / functional outcomes: See LTGs/Clinical Assessment Statement   Remaining deficits: N/A   Education / Equipment: HEP   Patient agrees to discharge. Patient goals were met.  Patient is being discharged due to meeting the stated rehab goals.   GOALS: Goals reviewed with patient? Yes  SHORT TERM GOALS: Target date: 11/07/2023  Pt will be independent with initial HEP in order to build upon functional gains made in therapy. Baseline: Goal status: MET  2.  Pt will improve FGA to at least a 15/30 in order to demo decr fall risk.  Baseline: 11/30  24/30 (2/14) Goal status: MET  3.  Pt will improve gait speed with no AD vs. LRAD to at least 2.2 ft/sec in order to demo improved community mobility.   Baseline: 17.85 seconds with no AD and CGA = 1.84 ft/sec    9.94 seconds with no AD = 3.29 ft/sec Goal status: MET  4.  Pt will ambulate at least 27' with no AD with supervision in order to demo improved functional mobility.  Baseline: currently needs CGA with no AD   Able to walk 345' with supervision  Goal status: MET    LONG TERM GOALS: Target date: 12/05/2023  Pt will be independent with final HEP in order to build upon functional gains made in therapy. Baseline:  Goal status: MET  2.  Pt will improve FGA to at least a 26/30  in order to demo decr fall risk. Baseline: 11/30  24/30 (2/14)  30/30 (3/21) Goal status: MET  3.   Pt will improve gait speed with no AD vs. LRAD to at least 2.7 ft/sec in order to demo improved community mobility.  Baseline: 17.85 seconds with no AD and CGA = 1.84 ft/sec    9.94 seconds with no AD = 3.29 ft/sec (2/14) Goal status: MET  4.  Pt will improve 5x sit<>stand to less than or equal to 14 sec with no UE support to demonstrate improved functional strength and transfer efficiency.   Baseline: 18.9 seconds with no UE support  6.7 seconds with no UE support (3/21) Goal status: MET  5.  Pt will improve TUG time to 10 seconds or less with supervision and no AD in order to demo decrease fall risk. Baseline: 13 seconds and CGA  6.4 seconds with mod I (3/21) Goal status: MET   ASSESSMENT:  CLINICAL IMPRESSION: Today's skilled session focused on checking LTGs for D/C. Pt has met all of her LTGs and made SIGNIFICANT IMPROVEMENTS! Pt improved FGA score to a 30/30! Pt's gait speed today with no AD was 3.72 ft/sec. Pt in agreement to D/C due to meeting goals and progress with therapy.    OBJECTIVE IMPAIRMENTS: Abnormal gait, decreased activity tolerance, decreased balance, decreased coordination, difficulty walking, and decreased strength.   ACTIVITY LIMITATIONS: carrying, lifting, standing, stairs, transfers, bathing, and locomotion level  PARTICIPATION LIMITATIONS: meal prep, driving, shopping, and community activity  PERSONAL FACTORS: Age, Past/current experiences, Time since onset of injury/illness/exacerbation, Transportation, and 1-2 comorbidities:  HLD HTN   are also affecting patient's functional outcome.   REHAB POTENTIAL: Good  CLINICAL DECISION MAKING: Stable/uncomplicated  EVALUATION COMPLEXITY: Low  PLAN:  PT FREQUENCY: 1x/week - due to family preference/transportation  PT DURATION: 8 weeks  PLANNED INTERVENTIONS: 97164- PT Re-evaluation,  97110-Therapeutic exercises, 97530- Therapeutic activity, [id number]- Neuromuscular re-education, 97535- Self Care, [id number]- Manual therapy, [phone]- Gait training, Patient/Family education, Balance training, Stair training, and DME instructions  PLAN FOR NEXT SESSION: D/C   Drake Leach, PT, DPT 12/09/2023, 3:45 PM

## 2023-12-13 ENCOUNTER — Ambulatory Visit (INDEPENDENT_AMBULATORY_CARE_PROVIDER_SITE_OTHER): Payer: Medicare Other

## 2023-12-13 DIAGNOSIS — I639 Cerebral infarction, unspecified: Secondary | ICD-10-CM

## 2023-12-14 LAB — CUP PACEART REMOTE DEVICE CHECK
Date Time Interrogation Session: 20250325171213
Implantable Pulse Generator Implant Date: 20250114

## 2023-12-15 NOTE — Addendum Note (Signed)
 Addended by: Elease Etienne A on: 12/15/2023 12:50 PM   Modules accepted: Orders

## 2023-12-15 NOTE — Progress Notes (Signed)
 [facility] Summary Report / Loop Recorder

## 2023-12-21 ENCOUNTER — Encounter: Payer: Self-pay | Admitting: Neurology

## 2023-12-21 ENCOUNTER — Ambulatory Visit: Payer: Medicare Other | Admitting: Neurology

## 2023-12-21 VITALS — BP 138/78 | HR 115 | Ht 62.0 in | Wt 121.0 lb

## 2023-12-21 DIAGNOSIS — I639 Cerebral infarction, unspecified: Secondary | ICD-10-CM

## 2023-12-21 DIAGNOSIS — E7849 Other hyperlipidemia: Secondary | ICD-10-CM | POA: Diagnosis not present

## 2023-12-21 MED ORDER — CLOPIDOGREL BISULFATE 75 MG PO TABS: 75.0000 mg | ORAL_TABLET | Freq: Every day | ORAL | 3 refills | Status: AC

## 2023-12-21 NOTE — Patient Instructions (Signed)
 I had a long d/w patient about her recent  cryptogenic stroke, risk for recurrent stroke/TIAs, personally independently reviewed imaging studies and stroke evaluation results and answered questions.Discontinue aspirin due to GI side effects and switch to Plavix 75 mg daily for secondary stroke prevention and maintain strict control of hypertension with blood pressure goal below 130/90, diabetes with hemoglobin A1c goal below 6.5% and lipids with LDL cholesterol goal below 70 mg/dL. I also advised the patient to eat a healthy diet with plenty of whole grains, cereals, fruits and vegetables, exercise regularly and maintain ideal body weight Followup in the future with my nurse practitioner in 6 months or call earlier if necessary.  Stroke Prevention Some medical conditions and behaviors can lead to a higher chance of having a stroke. You can help prevent a stroke by eating healthy, exercising, not smoking, and managing any medical conditions you have. Stroke is a leading cause of functional impairment. Primary prevention is particularly important because a majority of strokes are first-time events. Stroke changes the lives of not only those who experience a stroke but also their family and other caregivers. How can this condition affect me? A stroke is a medical emergency and should be treated right away. A stroke can lead to brain damage and can sometimes be life-threatening. If a person gets medical treatment right away, there is a better chance of surviving and recovering from a stroke. What can increase my risk? The following medical conditions may increase your risk of a stroke: Cardiovascular disease. High blood pressure (hypertension). Diabetes. High cholesterol. Sickle cell disease. Blood clotting disorders (hypercoagulable state). Obesity. Sleep disorders (obstructive sleep apnea). Other risk factors include: Being older than age 61. Having a history of blood clots, stroke, or mini-stroke  (transient ischemic attack, TIA). Genetic factors, such as race, ethnicity, or a family history of stroke. Smoking cigarettes or using other tobacco products. Taking birth control pills, especially if you also use tobacco. Heavy use of alcohol or drugs, especially cocaine and methamphetamine. Physical inactivity. What actions can I take to prevent this? Manage your health conditions High cholesterol levels. Eating a healthy diet is important for preventing high cholesterol. If cholesterol cannot be managed through diet alone, you may need to take medicines. Take any prescribed medicines to control your cholesterol as told by your health care provider. Hypertension. To reduce your risk of stroke, try to keep your blood pressure below 130/80. Eating a healthy diet and exercising regularly are important for controlling blood pressure. If these steps are not enough to manage your blood pressure, you may need to take medicines. Take any prescribed medicines to control hypertension as told by your health care provider. Ask your health care provider if you should monitor your blood pressure at home. Have your blood pressure checked every year, even if your blood pressure is normal. Blood pressure increases with age and some medical conditions. Diabetes. Eating a healthy diet and exercising regularly are important parts of managing your blood sugar (glucose). If your blood sugar cannot be managed through diet and exercise, you may need to take medicines. Take any prescribed medicines to control your diabetes as told by your health care provider. Get evaluated for obstructive sleep apnea. Talk to your health care provider about getting a sleep evaluation if you snore a lot or have excessive sleepiness. Make sure that any other medical conditions you have, such as atrial fibrillation or atherosclerosis, are managed. Nutrition Follow instructions from your health care provider about what to eat or  drink  to help manage your health condition. These instructions may include: Reducing your daily calorie intake. Limiting how much salt (sodium) you use to 1,500 milligrams (mg) each day. Using only healthy fats for cooking, such as olive oil, canola oil, or sunflower oil. Eating healthy foods. You can do this by: Choosing foods that are high in fiber, such as whole grains, and fresh fruits and vegetables. Eating at least 5 servings of fruits and vegetables a day. Try to fill one-half of your plate with fruits and vegetables at each meal. Choosing lean protein foods, such as lean cuts of meat, poultry without skin, fish, tofu, beans, and nuts. Eating low-fat dairy products. Avoiding foods that are high in sodium. This can help lower blood pressure. Avoiding foods that have saturated fat, trans fat, and cholesterol. This can help prevent high cholesterol. Avoiding processed and prepared foods. Counting your daily carbohydrate intake.  Lifestyle If you drink alcohol: Limit how much you have to: 0-1 drink a day for women who are not pregnant. 0-2 drinks a day for men. Know how much alcohol is in your drink. In the [place] one drink equals one 12 oz bottle of beer ( ), one 5 oz glass of wine ( ), or one 1 oz glass of hard liquor (44mL). Do not use any products that contain nicotine or tobacco. These products include cigarettes, chewing tobacco, and vaping devices, such as e-cigarettes. If you need help quitting, ask your health care provider. Avoid secondhand smoke. Do not use drugs. Activity  Try to stay at a healthy weight. Get at least 30 minutes of exercise on most days, such as: Fast walking. Biking. Swimming. Medicines Take over-the-counter and prescription medicines only as told by your health care provider. Aspirin or blood thinners (antiplatelets or anticoagulants) may be recommended to reduce your risk of forming blood clots that can lead to stroke. Avoid taking birth control  pills. Talk to your health care provider about the risks of taking birth control pills if: You are over 79 years old. You smoke. You get very bad headaches. You have had a blood clot. Where to find more information American Stroke Association: www.strokeassociation.org Get help right away if: You or a loved one has any symptoms of a stroke. "BE FAST" is an easy way to remember the main warning signs of a stroke: B - Balance. Signs are dizziness, sudden trouble walking, or loss of balance. E - Eyes. Signs are trouble seeing or a sudden change in vision. F - Face. Signs are sudden weakness or numbness of the face, or the face or eyelid drooping on one side. A - Arms. Signs are weakness or numbness in an arm. This happens suddenly and usually on one side of the body. S - Speech. Signs are sudden trouble speaking, slurred speech, or trouble understanding what people say. T - Time. Time to call emergency services. Write down what time symptoms started. You or a loved one has other signs of a stroke, such as: A sudden, severe headache with no known cause. Nausea or vomiting. Seizure. These symptoms may represent a serious problem that is an emergency. Do not wait to see if the symptoms will go away. Get medical help right away. Call your local emergency services (911 in the [place] Do not drive yourself to the hospital. Summary You can help to prevent a stroke by eating healthy, exercising, not smoking, limiting alcohol intake, and managing any medical conditions you may have. Do not use any products that contain  nicotine or tobacco. These include cigarettes, chewing tobacco, and vaping devices, such as e-cigarettes. If you need help quitting, ask your health care provider. Remember "BE FAST" for warning signs of a stroke. Get help right away if you or a loved one has any of these signs. This information is not intended to replace advice given to you by your health care provider. Make sure you  discuss any questions you have with your health care provider. Document Revised: 08/09/2022 Document Reviewed: 08/09/2022 Elsevier Patient Education  2024 [employer].

## 2023-12-21 NOTE — Progress Notes (Signed)
 [facility] [address] [place]. [place] [address] [phone]       OFFICE FOLLOW-UP NOTE  Kristy [name] Date of Birth:  01-Nov-1945 Medical Record Number:  [id number]   HPI: Kristy [name] is a pleasant 78 year old African-American lady seen today for initial office follow-up visit following hospital admission for stroke in January 2025.  History is obtained from Kristy [name] and review of electronic medical records.  I personally reviewed pertinent available imaging films in PACS. She has past medical history of allergy, anemia, hyperlipidemia and hypertension.  She presented on 10/02/2023 for evaluation for sudden onset of left arm and leg heaviness and weakness.  EMS called code stroke.  On exam she had an NIH stroke scale of 3 with left facial arm and leg weakness.  After discussion of risk benefits and alternatives IV TNK was administered uneventfully.  She is admitted to [facility]y ICU.  Blood pressure was tightly controlled.  MRI scan subsequently showed patchy infarct involving posterior aspect of right putamen, right caudate and adjacent white matter with some petechial hemorrhage and also involvement of Kristy right posterior posterior central gyrus.  There were changes of chronic small vessel disease as well.  2D echo showed ejection fraction of 60 to 65%.  Normal left atrial size.  Hemoglobin A1c was 5.5 and LDL cholesterol 83 mg percent.  CT angiogram showed area of 67 mm segment of moderate right M1 irregularity and stenosis with calcific hide atherosclerosis of both ICA siphons.  [name] was started on dual antiplatelet therapy aspirin Plavix for 3 weeks followed by aspirin alone.  She was discharged home with outpatient physical therapy needs.  [name] states she has done well since discharge.  She is just finished with therapy and discharge.  She has regained almost full strength on Kristy left side.  She does get a little stiff from time to time but doing exercises seem to  help.  She states she is not tolerating aspirin well and does get dyspepsia and stomach upset and would like to stop it.  Her blood pressure is under good control.  Her primary care physician increase Kristy dose of Crestor to 40 mg daily which she is tolerating well without side effects and she has plan to get follow-up lab work checked tomorrow.  She has no new complaints and denies any new recurrent stroke or TIA symptoms.  She had a loop recorder inserted and so far no paroxysmal A-fib has yet been found., ROS:   14 system review of systems is positive for weakness, numbness, facial droop, stomach irritation, dyspepsia all other systems negative  PMH:  Past Medical History:  Diagnosis Date   Allergy    Anemia    Hyperlipidemia    Hypertension     Social History:  Social History   Socioeconomic History   Marital status: Divorced    Spouse name: n/a   Number of children: 2   Years of education: Not on file   Highest education level: Bachelor's degree (e.g., BA, AB, BS)  Occupational History   Occupation: retired Field seismologist [employer]  Tobacco Use   Smoking status: Never   Smokeless tobacco: Never  Vaping Use   Vaping status: Never Used  Substance and Sexual Activity   Alcohol use: No    Alcohol/week: 0.0 standard drinks of alcohol   Drug use: No   Sexual activity: Never  Other Topics Concern   Not on file  Social History Narrative   Her  son lives with her.   Social Drivers of Corporate investment banker Strain: Low Risk  (08/05/2017)   Overall Financial Resource Strain (CARDIA)    Difficulty of Paying Living Expenses: Not hard at all  Food Insecurity: No Food Insecurity (10/17/2023)   Hunger Vital Sign    Worried About Running Out of Food in Kristy Last Year: Never true    Ran Out of Food in Kristy Last Year: Never true  Transportation Needs: No Transportation Needs (10/17/2023)   PRAPARE - Administrator, Civil Service (Medical): No    Lack of  Transportation (Non-Medical): No  Physical Activity: Inactive (08/05/2017)   Exercise Vital Sign    Days of Exercise per Week: 0 days    Minutes of Exercise per Session: 0 min  Stress: No Stress Concern Present (08/05/2017)   [employer] of Occupational Health - Occupational Stress Questionnaire    Feeling of Stress : Not at all  Social Connections: Moderately Isolated (10/02/2023)   Social Connection and Isolation Panel [NHANES]    Frequency of Communication with Friends and Family: More than three times a week    Frequency of Social Gatherings with Friends and Family: More than three times a week    Attends Religious Services: Never    Database administrator or Organizations: Yes    Attends Engineer, structural: More than 4 times per year    Marital Status: Divorced  Intimate Partner Violence: Not At Risk (10/17/2023)   Humiliation, Afraid, Rape, and Kick questionnaire    Fear of Current or Ex-Partner: No    Emotionally Abused: No    Physically Abused: No    Sexually Abused: No    Medications:   Current Outpatient Medications on File Prior to Visit  Medication Sig Dispense Refill   acetaminophen (TYLENOL) 325 MG tablet Take 650 mg by mouth every 6 (six) hours as needed.     amLODipine (NORVASC) 10 MG tablet Take 10 mg by mouth daily.     aspirin 81 MG chewable tablet Chew 1 tablet (81 mg total) by mouth daily. 30 tablet 2   cetirizine (ZYRTEC) 10 MG tablet Take 10 mg by mouth daily.     famotidine (PEPCID) 20 MG tablet Take 1 tablet (20 mg total) by mouth 2 (two) times daily. 180 tablet 1   hydrochlorothiazide (MICROZIDE) 12.5 MG capsule Take 1 capsule (12.5 mg total) by mouth daily. 90 capsule 1   meclizine (ANTIVERT) 12.5 MG tablet Take 1 tablet (12.5 mg total) by mouth 3 (three) times daily as needed for dizziness. 30 tablet 0   Multiple Vitamins-Minerals (MULTIVITAMIN WITH MINERALS) tablet Take 1 tablet by mouth daily.     rosuvastatin (CRESTOR) 40 MG tablet Take  40 mg by mouth daily.     Vitamin D, Ergocalciferol, (DRISDOL) 1.25 MG (50000 UNIT) CAPS capsule Take 50,000 Units by mouth every 7 (seven) days.     clopidogrel (PLAVIX) 75 MG tablet Take 1 tablet (75 mg total) by mouth daily. ([name] not taking: Reported on 12/21/2023) 21 tablet 0   No current facility-administered medications on file prior to visit.    Allergies:  No Known Allergies  Physical Exam General: well developed, well nourished pleasant elderly African-American lady, seated, in no evident distress Head: head normocephalic and atraumatic.  Neck: supple with no carotid or supraclavicular bruits Cardiovascular: regular rate and rhythm, no murmurs Musculoskeletal: no deformity Skin:  no rash/petichiae Vascular:  Normal pulses all extremities Vitals:  12/21/23 0801  BP: 138/78  Pulse: (!) 115   Neurologic Exam Mental Status: Awake and fully alert. Oriented to place and time. Recent and remote memory intact. Attention span, concentration and fund of knowledge appropriate. Mood and affect appropriate.  Cranial Nerves: Fundoscopic exam reveals sharp disc margins. Pupils equal, briskly reactive to light. Extraocular movements full without nystagmus. Visual fields full to confrontation. Hearing intact. Facial sensation intact. Face, tongue, palate moves normally and symmetrically.  Motor: Normal bulk and tone. Normal strength in all tested extremity muscles.  Diminished fine finger movements on Kristy left.  Orbits right over left upper extremity. Sensory.: intact to touch ,pinprick .position and vibratory sensation.  Coordination: Rapid alternating movements normal in all extremities. Finger-to-nose and heel-to-shin performed accurately bilaterally. Gait and Station: Arises from chair without difficulty. Stance is normal. Gait demonstrates normal stride length and balance . Able to heel, toe and tandem walk with mild difficulty.  Reflexes: 1+ and symmetric. Toes downgoing.   NIHSS   0 Modified Rankin  1   ASSESSMENT: 78 year old pleasant African-American lady with right large basal ganglia infarct in January 2025 treated with thrombolysis with IV TNK of cryptogenic etiology.  She is doing very well with near complete recovery.  Vascular risk factors of hypertension and hyperlipidemia only.     PLAN:I had a long d/w [name] about her recent  cryptogenic stroke, risk for recurrent stroke/TIAs, personally independently reviewed imaging studies and stroke evaluation results and answered questions.Discontinue aspirin due to GI side effects and switch to Plavix 75 mg daily for secondary stroke prevention and maintain strict control of hypertension with blood pressure goal below 130/90, diabetes with hemoglobin A1c goal below 6.5% and lipids with LDL cholesterol goal below 70 mg/dL. I also advised Kristy [name] to eat a healthy diet with plenty of whole grains, cereals, fruits and vegetables, exercise regularly and maintain ideal body weight Followup in Kristy future with my nurse practitioner in 6 months or call earlier if necessary.  Greater than 50% of time during this 35 minute visit was spent on counseling,explanation of diagnosis of basal ganglia and cryptogenic stroke, planning of further management, discussion with [name] and family and coordination of care Delia Heady, MD Note: This document was prepared with digital dictation and possible smart phrase technology. Any transcriptional errors that result from this process are unintentional

## 2023-12-22 DIAGNOSIS — Z79899 Other long term (current) drug therapy: Secondary | ICD-10-CM | POA: Diagnosis not present

## 2023-12-26 ENCOUNTER — Encounter: Payer: Self-pay | Admitting: Cardiology

## 2023-12-26 ENCOUNTER — Ambulatory Visit: Payer: Medicare Other | Attending: Cardiology | Admitting: Cardiology

## 2023-12-26 VITALS — BP 138/78 | HR 96 | Ht 62.0 in | Wt 118.2 lb

## 2023-12-26 DIAGNOSIS — E785 Hyperlipidemia, unspecified: Secondary | ICD-10-CM | POA: Diagnosis not present

## 2023-12-26 DIAGNOSIS — I10 Essential (primary) hypertension: Secondary | ICD-10-CM | POA: Insufficient documentation

## 2023-12-26 DIAGNOSIS — Z8679 Personal history of other diseases of the circulatory system: Secondary | ICD-10-CM | POA: Diagnosis not present

## 2023-12-26 DIAGNOSIS — I639 Cerebral infarction, unspecified: Secondary | ICD-10-CM | POA: Insufficient documentation

## 2023-12-26 NOTE — Patient Instructions (Signed)
 Medication Instructions:   No changes *If you need a refill on your cardiac medications before your next appointment, please call your pharmacy*   Lab Work: Not  needed    Testing/Procedures: Not needed   Follow-Up: At [facility], you and your health needs are our priority.  As part of our continuing mission to provide you with exceptional heart care, we have created designated Provider Care Teams.  These Care Teams include your primary Cardiologist (physician) and Advanced Practice Providers (APPs -  Physician Assistants and Nurse Practitioners) who all work together to provide you with the care you need, when you need it.     Your next appointment:   6 month(s)  The format for your next appointment:   In Person  Provider:    Bernadene Person NP  and then Bryan Lemma, MD in 12 months    Other Instructions

## 2023-12-26 NOTE — Progress Notes (Signed)
 Cardiology Office Note:  .   Date:  12/31/2023  ID:  Kristy [name], DOB Nov 10, 1945, MRN [id number] PCP: Tena Feeling, MD  [facility] Providers Cardiologist:  Randene Bustard, MD     Chief Complaint  Patient presents with   Hospitalization Follow-up    Cardiology follow-up after stroke    Patient Profile: .     Kristy [name] is a very pleasant 78 y.o. female with a PMH notable for hypertension, hyperlipidemia with recent CVA who presents here for recommended hospital cardiology follow-up at the request of Tena Feeling, MD.  Admitted with CVA 10/02/2023: Small infarct in the posterior right putamen, posterior right caudate nucleus and adjacent white matter suspicious for cryptogenic embolic source given the large size of subcortical infarct.  (Consider shower of emboli): Presenting symptoms with left arm and leg heaviness and weakness.  Treated with TNK in the ER with notable improvement in symptoms. Plan was aspirin Plavix for the first month, followed by Plavix alone Discharged on 20 mg rosuvastatin ILR placed 10/04/2023    Kristy [name] was seen for EP consultation on October 04, 2023 in the hospital for-recommendation was loop recorder placement that was performed on 10/04/2023.  Subjective  Discussed the use of AI scribe software for clinical note transcription with the patient, who gave verbal consent to proceed.  History of Present Illness History of Present Illness Kristy [name] is a 78 year old female who presents for follow-up of her loop recorder. She was referred by her discharge instructions to see a cardiologist for monitoring of her loop recorder.  In January, she experienced a stroke, which led to the implantation of a loop recorder to monitor for atrial fibrillation, suspected to have caused the stroke. The stroke remains cryptogenic. The loop recorder is expected to monitor her heart rhythm for over three years. She was discharged with instructions to  follow up with a neurologist, physical therapist, and cardiologist.  She has no current heart symptoms such as irregular heartbeats, palpitations, or skipped beats. No shortness of breath, chest pain, or swelling in her legs. Occasionally, she experiences a rapid heartbeat, which resolves quickly with rest or deep breathing. No shortness of breath when lying flat or waking up short of breath.  Her blood pressure readings at home range from 117/75 to 138/78, although she experiences 'white coat syndrome' during medical visits. She is currently taking amlodipine 10 mg and hydrochlorothiazide for blood pressure management.  For cholesterol management, she is on rosuvastatin 20 mg and Zetia 10 mg. Her last cholesterol check in February showed a total cholesterol of 193, triglycerides of 57, HDL of 86, and LDL of 97. Her primary care physician aims to reduce her LDL to below 70 due to her stroke history, with a follow-up lab scheduled in May.  She is on Plavix 75 mg instead of aspirin due to gastrointestinal upset caused by aspirin. She also uses meclizine for dizziness, Pepcid for gastrointestinal upset, and Zyrtec for allergies.  An echocardiogram in the hospital showed normal heart function with slight thickening consistent with high blood pressure. No atrial fibrillation has been detected in the loop recorder checks in February and March.  She had a successful thrombolytic treatment for her stroke, which resolved her initial symptoms of left arm and leg weakness. She completed six sessions of physical therapy and reports full recovery, including the ability to walk and drive.     Objective   Medications - Plavix 75 mg - Amlodipine  10 mg - Hydrochlorothiazide - Rosuvastatin 20 mg - Zetia 10 mg - Meclizine as needed - Pepcid (cimetidine) as needed for GI upset - Zyrtec as needed for allergies  Studies Reviewed: Aaron Aas   EKG Interpretation Date/Time:  Monday December 26 2023 15:56:00  EDT Ventricular Rate:  96 PR Interval:  172 QRS Duration:  88 QT Interval:  342 QTC Calculation: 432 R Axis:   63  Text Interpretation: Normal sinus rhythm Nonspecific ST abnormality No previous ECGs available Confirmed by Randene Bustard ([id number]) on 12/26/2023 4:06:10 PM    LABS (10/2023) Total cholesterol: 193 mg/dL; Triglycerides: 57 mg/dL; HDL: 86 mg/dL; LDL: 97 mg/dL (60/4540) Lab Results  Component Value Date   NA 140 10/04/2023   K 3.8 10/04/2023   CREATININE 0.63 10/04/2023   GFRNONAA >60 10/04/2023   GLUCOSE 93 10/04/2023   Lab Results  Component Value Date   HGBA1C 5.5 10/02/2023   DIAGNOSTIC Loop recorder check: No new AFib (11/2023) Echocardiogram: Normal heart function, slight thickening, normal right ventricle size, normal valves, normal pressures (09/2023) 1. Left ventricular ejection fraction, by estimation, is 60 to 65%. The left ventricle has normal function. The left ventricle has no regional  wall motion abnormalities. There is mild left ventricular hypertrophy. Left ventricular diastolic parameters are consistent with Grade I diastolic dysfunction (impaired relaxation).   2. Right ventricular systolic function is normal. The right ventricular size is normal. Tricuspid regurgitation signal is inadequate for assessing PA pressure.   3. The mitral valve is normal in structure. No evidence of mitral valve regurgitation.   4. There is mild calcification of the aortic valve. Aortic valve regurgitation is not visualized.   5. The inferior vena cava is normal in size with greater than 50% respiratory variability, suggesting right atrial pressure of 3 mmHg.   Risk Assessment/Calculations:        Blood pressure log reviewed -see HPI  Physical Exam:   VS:  BP 138/78   Pulse 96   Ht 5\' 2"  (1.575 m)   Wt 118 lb 3.2 oz (53.6 kg)   SpO2 99%   BMI 21.62 kg/m   => initial blood pressure was 160/88-improved on follow-up.  BP log reviewed. Wt Readings from Last 3  Encounters:  12/26/23 118 lb 3.2 oz (53.6 kg)  12/21/23 121 lb (54.9 kg)  10/03/23 134 lb 14.7 oz (61.2 kg)    GEN: Well nourished, well developed in no acute distress; healthy appearing NECK: No JVD; No carotid bruits CARDIAC: Normal S1, S2; RRR, no murmurs, rubs, gallops RESPIRATORY:  Clear to auscultation without rales, wheezing or rhonchi ; nonlabored, good air movement. ABDOMEN: Soft, non-tender, non-distended EXTREMITIES:  No edema; No deformity => no focal deficits./Weakness    ASSESSMENT AND PLAN: .    Problem List Items Addressed This Visit       Cardiology Problems   Acute ischemic stroke [facility]) (Chronic)   Cryptogenic Stroke Stroke in January with suspected AFib etiology. Thrombolytic therapy resolved clot. Loop recorder implanted for long-term AFib monitoring. - Monitor loop recorder for AFib over three years. - Follow-up with PA in six months, cardiologist in one year. - Continue Plavix 75 mg daily.      Relevant Medications   ezetimibe (ZETIA) 10 MG tablet   rosuvastatin (CRESTOR) 20 MG tablet   Essential hypertension - Primary (Chronic)   Home blood pressure normal, suggesting white coat hypertension. Current regimen effective. - Continue amlodipine 10 mg daily and hydrochlorothiazide 12.5 mg daily. - Monitor blood pressure at  home, especially before visits.      Relevant Medications   ezetimibe (ZETIA) 10 MG tablet   rosuvastatin (CRESTOR) 20 MG tablet   Other Relevant Orders   EKG 12-Lead (Completed)   Hyperlipidemia with target LDL less than 70 (Chronic)   LDL 97 mg/dL, above target for stroke history. On rosuvastatin and Zetia. Goal LDL <70 mg/dL. - Continue rosuvastatin 20 mg and Zetia 10 mg daily. - Cholesterol recheck with primary care in May.      Relevant Medications   ezetimibe (ZETIA) 10 MG tablet   rosuvastatin (CRESTOR) 20 MG tablet     Other   History of irregular heartbeat (Chronic)   No new AFib episodes detected in February and  March. Long-term monitoring essential due to potential infrequent AFib. - Continue monitoring with loop recorder. - Follow up with cardiology if AFib detected.        Follow-Up: Return in about 6 months (around 06/26/2024) for Alternate 6 month follow-up with APP & MD.    Signed, Arleen Lacer, MD, MS Randene Bustard, M.D., M.S. Interventional Cardiologist  [facility]  Pager # [phone] Phone # [phone] [address] [place], [place] [address]

## 2023-12-31 ENCOUNTER — Encounter: Payer: Self-pay | Admitting: Cardiology

## 2023-12-31 NOTE — Assessment & Plan Note (Signed)
 Home blood pressure normal, suggesting white coat hypertension. Current regimen effective. - Continue amlodipine 10 mg daily and hydrochlorothiazide 12.5 mg daily. - Monitor blood pressure at home, especially before visits.

## 2023-12-31 NOTE — Assessment & Plan Note (Signed)
 No new AFib episodes detected in February and March. Long-term monitoring essential due to potential infrequent AFib. - Continue monitoring with loop recorder. - Follow up with cardiology if AFib detected.

## 2023-12-31 NOTE — Assessment & Plan Note (Signed)
 LDL 97 mg/dL, above target for stroke history. On rosuvastatin and Zetia. Goal LDL <70 mg/dL. - Continue rosuvastatin 20 mg and Zetia 10 mg daily. - Cholesterol recheck with primary care in May.

## 2023-12-31 NOTE — Assessment & Plan Note (Signed)
 Cryptogenic Stroke Stroke in January with suspected AFib etiology. Thrombolytic therapy resolved clot. Loop recorder implanted for long-term AFib monitoring. - Monitor loop recorder for AFib over three years. - Follow-up with PA in six months, cardiologist in one year. - Continue Plavix 75 mg daily.

## 2024-01-03 ENCOUNTER — Telehealth: Payer: Self-pay

## 2024-01-03 NOTE — Patient Outreach (Signed)
 No telephone outreach to patient. Dr. Janett Medin obtained mRS successfully on 12/21/23. MRS=1  Kaye Parsons [facility] Health Care Management Assistant  Direct Dial: [phone]  Fax: [phone] Website: [email or web address]

## 2024-01-17 ENCOUNTER — Ambulatory Visit (INDEPENDENT_AMBULATORY_CARE_PROVIDER_SITE_OTHER): Payer: Medicare Other

## 2024-01-17 DIAGNOSIS — I639 Cerebral infarction, unspecified: Secondary | ICD-10-CM | POA: Diagnosis not present

## 2024-01-18 LAB — CUP PACEART REMOTE DEVICE CHECK
Date Time Interrogation Session: 20250429171211
Implantable Pulse Generator Implant Date: 20250114

## 2024-01-27 NOTE — Addendum Note (Signed)
 Addended by: Lott Rouleau A on: 01/27/2024 03:16 PM   Modules accepted: Orders

## 2024-01-27 NOTE — Progress Notes (Signed)
 [facility] Summary Report / Loop Recorder

## 2024-02-03 DIAGNOSIS — Z79899 Other long term (current) drug therapy: Secondary | ICD-10-CM | POA: Diagnosis not present

## 2024-02-20 ENCOUNTER — Ambulatory Visit: Payer: Self-pay | Admitting: Cardiology

## 2024-02-20 LAB — CUP PACEART REMOTE DEVICE CHECK
Date Time Interrogation Session: 20250530171226
Implantable Pulse Generator Implant Date: 20250114

## 2024-02-21 ENCOUNTER — Ambulatory Visit (INDEPENDENT_AMBULATORY_CARE_PROVIDER_SITE_OTHER): Payer: Medicare Other

## 2024-02-21 DIAGNOSIS — I639 Cerebral infarction, unspecified: Secondary | ICD-10-CM

## 2024-03-02 NOTE — Progress Notes (Signed)
 [facility] Summary Report / Loop Recorder

## 2024-03-02 NOTE — Addendum Note (Signed)
 Addended by: Lott Rouleau A on: 03/02/2024 09:30 AM   Modules accepted: Orders

## 2024-03-23 ENCOUNTER — Ambulatory Visit

## 2024-03-23 DIAGNOSIS — I639 Cerebral infarction, unspecified: Secondary | ICD-10-CM

## 2024-03-26 LAB — CUP PACEART REMOTE DEVICE CHECK
Date Time Interrogation Session: 20250703233426
Implantable Pulse Generator Implant Date: 20250114

## 2024-03-31 ENCOUNTER — Ambulatory Visit: Payer: Self-pay | Admitting: Cardiology

## 2024-04-18 NOTE — Progress Notes (Signed)
 [facility] Summary Report / Loop Recorder

## 2024-04-23 ENCOUNTER — Ambulatory Visit

## 2024-04-23 DIAGNOSIS — I639 Cerebral infarction, unspecified: Secondary | ICD-10-CM

## 2024-04-24 LAB — CUP PACEART REMOTE DEVICE CHECK
Date Time Interrogation Session: 20250803235505
Implantable Pulse Generator Implant Date: 20250114

## 2024-04-25 ENCOUNTER — Ambulatory Visit: Payer: Self-pay | Admitting: Cardiology

## 2024-05-24 ENCOUNTER — Ambulatory Visit

## 2024-05-24 DIAGNOSIS — I639 Cerebral infarction, unspecified: Secondary | ICD-10-CM

## 2024-05-24 LAB — CUP PACEART REMOTE DEVICE CHECK
Date Time Interrogation Session: 20250903233641
Implantable Pulse Generator Implant Date: 20250114

## 2024-05-26 ENCOUNTER — Ambulatory Visit: Payer: Self-pay | Admitting: Cardiology

## 2024-06-04 NOTE — Progress Notes (Signed)
 Remote Loop Recorder Transmission

## 2024-06-18 NOTE — Addendum Note (Signed)
 Addended by: VICCI SELLER A on: 06/18/2024 03:12 PM   Modules accepted: Orders

## 2024-06-18 NOTE — Progress Notes (Signed)
 Remote Loop Recorder Transmission

## 2024-06-19 ENCOUNTER — Encounter (HOSPITAL_COMMUNITY): Payer: Self-pay | Admitting: Emergency Medicine

## 2024-06-19 ENCOUNTER — Other Ambulatory Visit (HOSPITAL_COMMUNITY): Payer: Self-pay

## 2024-06-19 ENCOUNTER — Emergency Department (HOSPITAL_COMMUNITY)
Admission: EM | Admit: 2024-06-19 | Discharge: 2024-06-19 | Disposition: A | Discharge status: Home / Self Care | Attending: Emergency Medicine | Admitting: Emergency Medicine

## 2024-06-19 ENCOUNTER — Other Ambulatory Visit: Payer: Self-pay

## 2024-06-19 ENCOUNTER — Emergency Department (HOSPITAL_COMMUNITY)

## 2024-06-19 DIAGNOSIS — Z79899 Other long term (current) drug therapy: Secondary | ICD-10-CM | POA: Insufficient documentation

## 2024-06-19 DIAGNOSIS — R079 Chest pain, unspecified: Secondary | ICD-10-CM

## 2024-06-19 DIAGNOSIS — Z7902 Long term (current) use of antithrombotics/antiplatelets: Secondary | ICD-10-CM | POA: Insufficient documentation

## 2024-06-19 DIAGNOSIS — Z8673 Personal history of transient ischemic attack (TIA), and cerebral infarction without residual deficits: Secondary | ICD-10-CM | POA: Diagnosis not present

## 2024-06-19 DIAGNOSIS — I639 Cerebral infarction, unspecified: Secondary | ICD-10-CM | POA: Insufficient documentation

## 2024-06-19 DIAGNOSIS — R072 Precordial pain: Secondary | ICD-10-CM

## 2024-06-19 DIAGNOSIS — E785 Hyperlipidemia, unspecified: Secondary | ICD-10-CM | POA: Diagnosis not present

## 2024-06-19 DIAGNOSIS — R000 Tachycardia, unspecified: Secondary | ICD-10-CM | POA: Diagnosis not present

## 2024-06-19 DIAGNOSIS — I10 Essential (primary) hypertension: Secondary | ICD-10-CM | POA: Diagnosis not present

## 2024-06-19 LAB — BASIC METABOLIC PANEL WITH GFR
Anion gap: 12 (ref 5–15)
BUN: 7 mg/dL — ABNORMAL LOW (ref 8–23)
CO2: 25 mmol/L (ref 22–32)
Calcium: 9.9 mg/dL (ref 8.9–10.3)
Chloride: 104 mmol/L (ref 98–111)
Creatinine, Ser: 0.81 mg/dL (ref 0.44–1.00)
GFR, Estimated: >60 mL/min (ref >60)
Glucose, Bld: 117 mg/dL — ABNORMAL HIGH (ref 70–99)
Potassium: 3.3 mmol/L — ABNORMAL LOW (ref 3.5–5.1)
Sodium: 141 mmol/L (ref 135–145)

## 2024-06-19 LAB — CBC WITH DIFFERENTIAL/PLATELET
Basophils Absolute: 0.1 K/uL (ref 0.0–0.1)
Basophils Relative: 1 %
Eosinophils Absolute: 0 K/uL (ref 0.0–0.5)
Eosinophils Relative: 0 %
HCT: 39.8 % (ref 36.0–46.0)
Hemoglobin: 13.2 g/dL (ref 12.0–15.0)
Lymphocytes Relative: 8 %
Lymphs Abs: 0.7 K/uL (ref 0.7–4.0)
MCH: 29.5 pg (ref 26.0–34.0)
MCHC: 33.2 g/dL (ref 30.0–36.0)
MCV: 89 fL (ref 80.0–100.0)
Monocytes Absolute: 0.2 K/uL (ref 0.1–1.0)
Monocytes Relative: 2 %
Neutro Abs: 7.8 K/uL — ABNORMAL HIGH (ref 1.7–7.7)
Neutrophils Relative %: 89 %
Platelets: 214 K/uL (ref 150–400)
RBC: 4.47 MIL/uL (ref 3.87–5.11)
RDW: 13.3 % (ref 11.5–15.5)
WBC: 8.8 K/uL (ref 4.0–10.5)
nRBC: 0 % (ref 0.0–0.2)

## 2024-06-19 LAB — PROTIME-INR
INR: 0.9 (ref 0.8–1.2)
Prothrombin Time: 13.1 s (ref 11.4–15.2)

## 2024-06-19 LAB — TROPONIN I (HIGH SENSITIVITY)
Troponin I (High Sensitivity): 10 ng/L (ref <18)
Troponin I (High Sensitivity): 12 ng/L (ref <18)

## 2024-06-19 MED ORDER — METOPROLOL TARTRATE 100 MG PO TABS
100.0000 mg | ORAL_TABLET | Freq: Once | ORAL | 0 refills | Status: DC
  Filled 2024-06-19: qty 1, 1d supply, fill #0

## 2024-06-19 MED ORDER — METOPROLOL TARTRATE 100 MG PO TABS: 100.0000 mg | ORAL_TABLET | Freq: Once | ORAL | 0 refills | Status: DC

## 2024-06-19 NOTE — ED Provider Notes (Signed)
 [place] EMERGENCY DEPARTMENT AT [facility] Provider Note   CSN: [id number] Arrival date & time: 06/19/24  9247     Patient presents with: Chest Pain   Kristy [name] is a 78 y.o. female.  78 year old female presents to the ED via EMS for chest pain awaking her from sleep at 6 AM.  Patient describes the pain as a pressure that was 10 out of 10 upon waking and has since resolved to a 4 out of 10.  Patient reports there is no radiation and it does not increase or decrease with movement.  She had associated weakness when trying to get up without dizziness.  Patient denies shortness of breath, dizziness, diaphoresis, visual disturbances, nausea, vomiting.  Patient has significant history of stroke in January 2025 which she is on Plavix  currently.  Patient is also being followed by cardiology with a Holter monitor to evaluate for irregular rhythms patient is undiagnosed with any irregular rhythms at this time.  Patient has hyperlipidemia and hypertension as well.     Prior to Admission medications   Medication Sig Start Date End Date Taking? Authorizing Provider  acetaminophen  (TYLENOL ) 325 MG tablet Take 650 mg by mouth every 6 (six) hours as needed for moderate pain (pain score 4-6).   Yes [provider]  amLODipine  (NORVASC ) 10 MG tablet Take 10 mg by mouth daily.   Yes [provider]  cetirizine (ZYRTEC) 10 MG tablet Take 10 mg by mouth daily.   Yes [provider]  clopidogrel  (PLAVIX ) 75 MG tablet Take 1 tablet (75 mg total) by mouth daily. 12/21/23  Yes Sethi, Pramod S, MD  ezetimibe (ZETIA) 10 MG tablet Take 10 mg by mouth daily. 12/23/23  Yes [provider]  famotidine  (PEPCID ) 20 MG tablet Take 1 tablet (20 mg total) by mouth 2 (two) times daily. 12/26/19  Yes Stallings, Zoe A, MD  hydrochlorothiazide  (MICROZIDE ) 12.5 MG capsule Take 1 capsule (12.5 mg total) by mouth daily. 12/26/19  Yes Stallings, Zoe A, MD  meclizine  (ANTIVERT ) 12.5 MG  tablet Take 1 tablet (12.5 mg total) by mouth 3 (three) times daily as needed for dizziness. 12/26/19  Yes Stallings, Zoe A, MD  rosuvastatin  (CRESTOR ) 20 MG tablet Take 20 mg by mouth daily. 10/04/23  Yes [provider]  Evolocumab (REPATHA SURECLICK) 140 MG/ML SOAJ Inject 140 mg into the skin every 14 (fourteen) days. Patient not taking: Reported on 06/19/2024 02/05/24   [provider]  metoprolol tartrate (LOPRESSOR) 100 MG tablet Take 1 tablet (100 mg total) by mouth once for 1 dose. To be taken 1 hour prior to the CT of your heart 06/19/24 06/19/24  Waymond Cart, MD    Allergies: Patient has no known allergies.    Review of Systems  Cardiovascular:  Positive for chest pain.  All other systems reviewed and are negative.   Updated Vital Signs BP (!) 143/74   Pulse 88   Temp 98.8 F (37.1 C) (Oral)   Resp 15   Ht 5' 2 (1.575 m)   Wt 56.7 kg   SpO2 100%   BMI 22.86 kg/m   Physical Exam Vitals and nursing note reviewed.  Constitutional:      Appearance: Normal appearance. She is well-developed.  HENT:     Head: Normocephalic and atraumatic.     Nose: Nose normal.  Eyes:     Extraocular Movements: Extraocular movements intact.     Conjunctiva/sclera: Conjunctivae normal.     Pupils: Pupils are  equal, round, and reactive to light.  Cardiovascular:     Rate and Rhythm: Normal rate and regular rhythm.     Heart sounds: Normal heart sounds.  Pulmonary:     Effort: Pulmonary effort is normal. No respiratory distress.     Breath sounds: Normal breath sounds.  Musculoskeletal:        General: Normal range of motion.     Cervical back: Normal range of motion.     Right lower leg: No edema.     Left lower leg: No edema.  Skin:    General: Skin is warm.     Capillary Refill: Capillary refill takes less than 2 seconds.  Neurological:     General: No focal deficit present.     Mental Status: She is alert.  Psychiatric:        Mood and Affect: Mood normal.         Behavior: Behavior normal.     (all labs ordered are listed, but only abnormal results are displayed) Labs Reviewed  BASIC METABOLIC PANEL WITH GFR - Abnormal; Notable for the following components:      Result Value   Potassium 3.3 (*)    Glucose, Bld 117 (*)    BUN 7 (*)    All other components within normal limits  CBC WITH DIFFERENTIAL/PLATELET - Abnormal; Notable for the following components:   Neutro Abs 7.8 (*)    All other components within normal limits  PROTIME-INR  TROPONIN I (HIGH SENSITIVITY)  TROPONIN I (HIGH SENSITIVITY)    EKG: EKG Interpretation Date/Time:  Tuesday June 19 2024 07:58:07 EDT Ventricular Rate:  89 PR Interval:  179 QRS Duration:  88 QT Interval:  364 QTC Calculation: 443 R Axis:   53  Text Interpretation: Sinus rhythm Confirmed by Laurice Coy [phone]) on 06/19/2024 7:59:09 AM  Radiology: DG Chest Portable 1 View Result Date: 06/19/2024 CLINICAL DATA:  Chest pain EXAM: PORTABLE CHEST 1 VIEW COMPARISON:  09/30/2021 FINDINGS: The lungs are clear without focal pneumonia, edema, pneumothorax or pleural effusion. The cardiopericardial silhouette is within normal limits for size. No acute bony abnormality. Loop recorder device evident. Telemetry leads overlie the chest. IMPRESSION: No active disease. Electronically Signed   By: Camellia Candle M.D.   On: 06/19/2024 08:45    Procedures   Medications Ordered in the ED - No data to display  78 y.o. female presents to the ED with complaints of chest pain and pressure since 6 AM, this involves an extensive number of treatment options, and is a complaint that carries with it a high risk of complications and morbidity.  The differential diagnosis includes ACS, tamponade, CHF, aortic dissection, pneumo, GERD, osteochondritis, (Ddx)  On arrival pt is nontoxic, vitals mildly hypertensive all other vitals unremarkable.   Additional history obtained from chart review significant for cardiac follow-up with  Holter monitor.  Patient also is being followed by neuro for stroke.  Lab Tests:  I Ordered, reviewed, and interpreted labs, which included: BMP, CBC, troponin, PT/INR  Imaging Studies ordered:  I ordered imaging studies which included chest x-ray, I independently visualized and interpreted imaging which showed no acute abnormalities  ED Course:   77 year old female presents to ED via EMS with complaints of chest pain/pressure since 6 AM.  Patient reported 10 out of 10 chest pain that woke her out of sleep.  Patient describes the pain as nonradiating and not provoking with movement or deep breaths.  On exam pain is nonreproducible to palpation and  there is no obvious rashes or discoloration to the skin over the area of pain.  Patient is currently in 4 out of 10 pain and has received 4 baby aspirin  from EMS.  EKG is initially unremarkable.  Patient is nondiaphoretic nontoxic-appearing in no obvious distress.  No bilateral edema in the lower extremities.  Lungs are clear to auscultation in all fields and patient has 100% saturation on room air.  Patient denies any visual disturbances or blurry vision.  EOM's intact and pupils equal and reactive to light bilaterally.  Patient denies headache, dizziness, shortness of breath currently.  Patient does endorse biting her tongue at some point in the night and she has a small cut on her tongue and reports some mild pain.  Initial plan is to rule out cardiac etiology.  I spoke with Dr. Marylee Scurry with hospitalist service and he advised to reach out to cardiology to confirm if they would want to do further workup inpatient or outpatient and then reevaluate for admission.  Cardiology advised they would come to the ED and consult with her as well.  After consultation with cardiology Dr. Anner with cardiology advised that they would do her CCTA outpatient and she was cleared for discharge.    Portions of this note were generated with Scientist, clinical (histocompatibility and immunogenetics).  Dictation errors may occur despite best attempts at proofreading.   Final diagnoses:  Precordial chest pain    ED Discharge Orders          Ordered    metoprolol tartrate (LOPRESSOR) 100 MG tablet   Once        06/19/24 1638               Windi Toro S, PA-C 06/20/24 1723    Laurice Maude BROCKS, MD 06/20/24 [phone]

## 2024-06-19 NOTE — Discharge Instructions (Signed)
 Cardiology advised that they would get you in earlier for evaluation.  All other exam and lab work were reassuring today.  Continue to monitor for worsening symptoms and return to ED if any concerning symptoms arise.

## 2024-06-19 NOTE — Consult Note (Addendum)
 Cardiology Consultation   Patient ID: KAMBREA CARRASCO MRN: [id number]; DOB: 1946-07-29  Admit date: 06/19/2024 Date of Consult: 06/19/2024  PCP:  Dwight Trula SQUIBB, MD   [facility] Providers Cardiologist:  Alm Clay, MD      Patient Profile: ADELLE ZACHAR is a 78 y.o. female with a hypertension, hyperlipidemia, hx of recent cryptogenic CVA [09/2023], dizziness, and dyspepsia who is being seen 06/19/2024 for the evaluation of chest pain at the request of Maude Galloway MD.  History of Present Illness: Ms. Camille has no prior cardiac history. She was admitted 09/2023 for CVA, and she was treated with TNK in the ER with improvement in symptoms. Given suspicion for cryptogenic embolic source, a ILR was placed during that admission. Echo showed LVEF 60-65% with no RWMA and mild LVH. G1 DD. She is now on plavix  monotherapy.   She met with Dr. Clay 12/2023 for cardiology follow-up after CVA. At that time she was mostly asymptomatic from a cardiac standpoint. She did mention brief episodes of palpitations that resolved with deep inspiration.  ILR shows no evidence of AF or other arrhythmia since 05/24/24   Presented to ED on 9/30 by EMS for left sided chest heaviness and weakness. She received ASA load in route.   BP:148/73   HR 90    ECG: sinus rhythm VR 89 [unchanged from previous] CXR unremarkable Lab Work: K 3.3   Troponin negative  unremarkable CBC She has received no medications in the ED.   On interview, patient shared she woke up to chest pain that was 9/10 described as a heaviness with no associated symptoms. She shares that chest pain was not worsened by positional change, palpation, exertion or inspiration. It went away on it's own with time. Now 1/10.  Denied cardiac history. Denied anginal symptoms.  Has not taken her medications this am, has been complaint. On plavix .  Denied tobacco use.  She does have some back pain that she relates to positional/ uncomfortable  bed.   Patient denied any exacerbating features or factors for her chest discomfort.  Was not made worse with deep inspiration or expiration.  Not made worse with standing or sitting or sitting upright versus lying flat.  No exacerbation with walking.  No associated dyspnea or nausea.  Back pain was positional but not chest pain.  No recurrent CVA type symptoms.  No arrhythmia symptoms.   Past Medical History:  Diagnosis Date   Allergy    Anemia    Hyperlipidemia    Hypertension    Stroke (HCC) 10/02/2023    Past Surgical History:  Procedure Laterality Date   ABDOMINAL HYSTERECTOMY     LOOP RECORDER INSERTION N/A 10/04/2023   Procedure: LOOP RECORDER INSERTION;  Surgeon: Lesia Ozell Barter, PA-C;  Location: [facility] INVASIVE CV LAB;  Service: Cardiovascular;  Laterality: N/A;   PARTIAL HYSTERECTOMY     TUBAL LIGATION         Scheduled Meds:  Continuous Infusions:  PRN Meds:   Allergies:   No Known Allergies  Social History:   Social History   Socioeconomic History   Marital status: Divorced    Spouse name: n/a   Number of children: 2   Years of education: Not on file   Highest education level: Bachelor's degree (e.g., BA, AB, BS)  Occupational History   Occupation: retired Field seismologist [employer]  Tobacco Use   Smoking status: Never   Smokeless tobacco: Never  Vaping Use   Vaping status:  Never Used  Substance and Sexual Activity   Alcohol use: No    Alcohol/week: 0.0 standard drinks of alcohol   Drug use: No   Sexual activity: Never  Other Topics Concern   Not on file  Social History Narrative   Her son lives with her.   Social Drivers of Corporate investment banker Strain: Low Risk  (08/05/2017)   Overall Financial Resource Strain (CARDIA)    Difficulty of Paying Living Expenses: Not hard at all  Food Insecurity: No Food Insecurity (10/17/2023)   Hunger Vital Sign    Worried About Running Out of Food in the Last Year: Never true    Ran Out of  Food in the Last Year: Never true  Transportation Needs: No Transportation Needs (10/17/2023)   PRAPARE - Administrator, Civil Service (Medical): No    Lack of Transportation (Non-Medical): No  Physical Activity: Inactive (08/05/2017)   Exercise Vital Sign    Days of Exercise per Week: 0 days    Minutes of Exercise per Session: 0 min  Stress: No Stress Concern Present (08/05/2017)   [employer] of Occupational Health - Occupational Stress Questionnaire    Feeling of Stress : Not at all  Social Connections: Moderately Isolated (10/02/2023)   Social Connection and Isolation Panel    Frequency of Communication with Friends and Family: More than three times a week    Frequency of Social Gatherings with Friends and Family: More than three times a week    Attends Religious Services: Never    Database administrator or Organizations: Yes    Attends Engineer, structural: More than 4 times per year    Marital Status: Divorced  Intimate Partner Violence: Not At Risk (10/17/2023)   Humiliation, Afraid, Rape, and Kick questionnaire    Fear of Current or Ex-Partner: No    Emotionally Abused: No    Physically Abused: No    Sexually Abused: No    Family History:   Family History  Problem Relation Age of Onset   Heart attack Mother    Heart disease Mother        Heart attack   Hypertension Mother    Stroke Mother    Stroke Father    Stroke Sister    Stroke Brother      ROS:  Please see the history of present illness.  All other ROS reviewed and negative.     Physical Exam/Data: Vitals:   06/19/24 1230 06/19/24 1245 06/19/24 1300 06/19/24 1315  BP: 125/62 128/73 120/63   Pulse:      Resp: 16 16 14 14   Temp:      TempSrc:      SpO2:      Weight:      Height:       No intake or output data in the 24 hours ending 06/19/24 1418    06/19/2024    8:14 AM 12/26/2023    3:50 PM 12/21/2023    8:01 AM  Last 3 Weights  Weight (lbs) 125 lb 118 lb 3.2 oz 121 lb   Weight (kg) 56.7 kg 53.615 kg 54.885 kg     Body mass index is 22.86 kg/m.  General:  Well nourished, well developed, in no acute distress; somewhat thin but otherwise healthy appearing.  Nontoxic. HEENT: normal Vascular: No carotid bruits; Distal pulses 2+ bilaterally Cardiac:  normal S1, S2; RRR; no murmur. No chest wall tenderness.  Lungs:  clear to auscultation  bilaterally, no wheezing, rhonchi or rales  Ext: no edema Musculoskeletal:  No deformities, BUE and BLE strength normal and equal Skin: warm and dry  Neuro:  CNs 2-12 intact, no focal abnormalities noted Psych:  Normal affect   EKG:  The EKG was personally reviewed and demonstrates:  see hpi Telemetry:  Telemetry was personally reviewed and demonstrates:  normal sinus rhythm HR 80  Relevant CV Studies: Echocardiogram 1/13/20205: LVEF 60 to 65%.  Normal LV function.  No RWMA.  Mild LVH with GR 1 DD.  Normal RV.  Normal aortic and mitral valves with mild aortic valvular calcification..  Normal RAP.  Laboratory Data: High Sensitivity Troponin:   Recent Labs  Lab 06/19/24 0853 06/19/24 1114  TROPONINIHS 10 12     Chemistry Recent Labs  Lab 06/19/24 0853  NA 141  K 3.3*  CL 104  CO2 25  GLUCOSE 117*  BUN 7*  CREATININE 0.81  CALCIUM  9.9  GFRNONAA >60  ANIONGAP 12     Hematology Recent Labs  Lab 06/19/24 0853  WBC 8.8  RBC 4.47  HGB 13.2  HCT 39.8  MCV 89.0  MCH 29.5  MCHC 33.2  RDW 13.3  PLT 214    Radiology/Studies:  DG Chest Portable 1 View Result Date: 06/19/2024 CLINICAL DATA:  Chest pain EXAM: PORTABLE CHEST 1 VIEW COMPARISON:  09/30/2021 FINDINGS: The lungs are clear without focal pneumonia, edema, pneumothorax or pleural effusion. The cardiopericardial silhouette is within normal limits for size. No acute bony abnormality. Loop recorder device evident. Telemetry leads overlie the chest. IMPRESSION: No active disease. Electronically Signed   By: Camellia Candle M.D.   On: 06/19/2024 08:45     Assessment and Plan: Atypical chest pain Symptoms described as heaviness to left side without other associated symptoms, now 1/10. ECG with no evidence of acute ischemia Negative troponins  She has no prior cardiac history, however she recently had a stroke of unknown origin s/p ILR. She has no history of tobacco use or T2DM. While I do not suspect ACS, I do think we should evaluate her coronary anatomy with CCTA which can be done outpatient. Will order this and move previously scheduled cardiology outpatient appointment.    Hypertension BP: 133/65 PTA medications amlodipine  10 mg & hydrochlorothiazide  12.5 mg.  Continue if being admitted  Hyperlipidemia  Lipid panel 10/03/23 LDL 83   HDL 73 PTA crestor  20 mg & zetia 10 mg Continue if being admitted  Hx of CVA Patient has not had dose of plavix  today, if being admitted please order for today.   Per primary   Hypokalemia Dyspepsia Dizziness   Risk Assessment/Risk Scores:       For questions or updates, please contact [facility] Please consult [email or web address] for contact info under    Signed, Leontine LOISE Salen, PA-C  06/19/2024 2:18 PM   ATTENDING ATTESTATION  I have seen, examined and evaluated the patient this afternoon in the ER along with Leontine Salen, PA.  After reviewing all the available data and chart, we discussed the patients laboratory, study & physical findings as well as symptoms in detail.  I agree with her findings, examination as well as impression recommendations as per our discussion.    Attending adjustments noted in italics.     Patient with prior stroke with no other cardiac history who presented with severe 8-10 out of 10 chest pain that woke her from sleep.  She had ongoing pain throughout the course of the morning with no  evidence of troponin bump.  No evidence of myocardial infarction which would be expected given prolonged chest pain if it were cardiac in nature.  The fact  that the troponin levels are all below 20 would suggest this is low likelihood to be cardiac in nature and is very low risk.  The fact that she had a stroke does place her at some risk and therefore is not unreasonable to consider cardiac evaluation of chest pain.  However I do not think this needs to be done in the inpatient setting.  We will plan for outpatient coronary CT angiogram and arrange for her to get the beta-blocker dosing.  She will have her appointment with Damien Braver, NP postpone for 1 to 2 weeks to allow for her CT scan to be done prior to that follow-up.  She is already on Plavix  and statin for her stroke.  Will continue her home medications.   Discussed with EDP.   Alm MICAEL Clay, MD, MS Alm Clay, M.D., M.S. Interventional Cardiologist  [facility] Pager # [phone]

## 2024-06-19 NOTE — ED Triage Notes (Addendum)
 Patient BIB by [facility] from home. EMS said she reported  heaviness on left chest  and weakness since 5:30 am. She has no cardiac history other than hypertension. Patient has a stroke  October 02, 2023 with any deficit. She received 4 baby Asprin from EMS. She reported a pain of  a 8 before the Asprin then it went down to a 4. Patient is A & O x 4. Vital signs: BP 140/70, Pulse 100, Saturation 98% RA, CBG 141.

## 2024-06-19 NOTE — Consult Note (Signed)
 Consult  [name]:[id number] DOB: [birth year] DOA: 06/19/2024  PCP: Dwight Trula SQUIBB, MD   Patient coming from: Home  Chief Complaint: Chest pain, weakness  HPI: Kristy [name] is a 78 y.o. female with medical history significant of hypertension, hyperlipidemia, CVA, anemia presenting with chest pain and weakness.  Patient woke from sleep around 6 AM with pressure-like chest pain rating a 10 out of 10.  Nonradiating pain with no change with activity.  Did report some initial associated generalized weakness when she tried to get up.  No dizziness.  Recently had a stroke in January and has been followed by cardiology has a loop recorder was placed at that time to evaluate for irregular heart rhythm.  Continues to take Plavix .  Received aspirin  by EMS.  Chest pain improved now in the ED.  Does report biting her trying overnight that she noticed.  Denies fevers, chills, shortness of breath, abdominal pain, constipation, nausea, vomiting.  ED Course: Vital signs in the ED stable.  Lab workup notable for BMP with potassium 3.3, glucose 117.  CBC within normal limits.  PT and INR normal.  Troponin flat/negative at 10, 12.  Chest x-ray showed no acute abnormality.  EDP consulted hospital service for admission for chest pain rule out.  EDP is consulting cardiology to consider inpatient provocative testing versus outpatient workup.  Review of Systems: As per HPI otherwise all other systems reviewed and are negative.  Past Medical History:  Diagnosis Date   Allergy    Anemia    Hyperlipidemia    Hypertension    Stroke (HCC) 10/02/2023    Past Surgical History:  Procedure Laterality Date   ABDOMINAL HYSTERECTOMY     LOOP RECORDER INSERTION N/A 10/04/2023   Procedure: LOOP RECORDER INSERTION;  Surgeon: Lesia Ozell Barter, PA-C;  Location: [facility];  Service: Cardiovascular;  Laterality: N/A;   PARTIAL HYSTERECTOMY     TUBAL LIGATION      Social History  reports  that she has never smoked. She has never used smokeless tobacco. She reports that she does not drink alcohol and does not use drugs.  No Known Allergies  Family History  Problem Relation Age of Onset   Heart attack Mother    Heart disease Mother        Heart attack   Hypertension Mother    Stroke Mother    Stroke Father    Stroke Sister    Stroke Brother   Reviewed on admission  Prior to Admission medications   Medication Sig Start Date End Date Taking? Authorizing Provider  acetaminophen  (TYLENOL ) 325 MG tablet Take 650 mg by mouth every 6 (six) hours as needed for moderate pain (pain score 4-6).   Yes [provider]  amLODipine  (NORVASC ) 10 MG tablet Take 10 mg by mouth daily.   Yes [provider]  cetirizine (ZYRTEC) 10 MG tablet Take 10 mg by mouth daily.   Yes [provider]  clopidogrel  (PLAVIX ) 75 MG tablet Take 1 tablet (75 mg total) by mouth daily. 12/21/23  Yes Sethi, Pramod S, MD  ezetimibe (ZETIA) 10 MG tablet Take 10 mg by mouth daily. 12/23/23  Yes [provider]  famotidine  (PEPCID ) 20 MG tablet Take 1 tablet (20 mg total) by mouth 2 (two) times daily. 12/26/19  Yes Stallings, Zoe A, MD  hydrochlorothiazide  (MICROZIDE ) 12.5 MG capsule Take 1 capsule (12.5 mg total) by mouth daily. 12/26/19  Yes Stallings, Zoe A, MD  meclizine  (ANTIVERT ) 12.5  MG tablet Take 1 tablet (12.5 mg total) by mouth 3 (three) times daily as needed for dizziness. 12/26/19  Yes Stallings, Zoe A, MD  rosuvastatin  (CRESTOR ) 20 MG tablet Take 20 mg by mouth daily. 10/04/23  Yes [provider]  Evolocumab (REPATHA SURECLICK) 140 MG/ML SOAJ Inject 140 mg into the skin every 14 (fourteen) days. Patient not taking: Reported on 06/19/2024 02/05/24   [provider]    Physical Exam: Vitals:   06/19/24 0759 06/19/24 0813 06/19/24 0814 06/19/24 1100  BP: (!) 148/73   134/65  Pulse: 90   85  Resp: 12   12  Temp: 98.3 F (36.8 C)   98.5 F (36.9 C)   TempSrc: Oral   Oral  SpO2: 100% 100%  100%  Weight:   56.7 kg   Height:   5' 2 (1.575 m)     Physical Exam Constitutional:      General: She is not in acute distress.    Appearance: Normal appearance.  HENT:     Head: Normocephalic and atraumatic.     Mouth/Throat:     Mouth: Mucous membranes are moist.     Pharynx: Oropharynx is clear.  Eyes:     Extraocular Movements: Extraocular movements intact.     Pupils: Pupils are equal, round, and reactive to light.  Cardiovascular:     Rate and Rhythm: Normal rate and regular rhythm.     Pulses: Normal pulses.     Heart sounds: Normal heart sounds.  Pulmonary:     Effort: Pulmonary effort is normal. No respiratory distress.     Breath sounds: Normal breath sounds.  Abdominal:     General: Bowel sounds are normal. There is no distension.     Palpations: Abdomen is soft.     Tenderness: There is no abdominal tenderness.  Musculoskeletal:        General: No swelling or deformity.  Skin:    General: Skin is warm and dry.  Neurological:     General: No focal deficit present.     Mental Status: Mental status is at baseline.     Labs on Admission: I have personally reviewed following labs and imaging studies  CBC: Recent Labs  Lab 06/19/24 0853  WBC 8.8  NEUTROABS 7.8*  HGB 13.2  HCT 39.8  MCV 89.0  PLT 214    Basic Metabolic Panel: Recent Labs  Lab 06/19/24 0853  NA 141  K 3.3*  CL 104  CO2 25  GLUCOSE 117*  [name] 7*  CREATININE 0.81  CALCIUM  9.9    GFR: Estimated Creatinine Clearance: 46.7 mL/min (by C-G formula based on SCr of 0.81 mg/dL).  Liver Function Tests: No results for input(s): AST, ALT, ALKPHOS, BILITOT, PROT, ALBUMIN in the last 168 hours.  Urine analysis:    Component Value Date/Time   APPEARANCEUR Clear 08/05/2017 1546   GLUCOSEU Negative 08/05/2017 1546   BILIRUBINUR Negative 08/05/2017 1546   KETONESUR negative 02/03/2017 0858   PROTEINUR Negative 08/05/2017 1546    UROBILINOGEN 0.2 02/03/2017 0858   NITRITE Negative 08/05/2017 1546   LEUKOCYTESUR Trace (A) 08/05/2017 1546    Radiological Exams on Admission: DG Chest Portable 1 View Result Date: 06/19/2024 CLINICAL DATA:  Chest pain EXAM: PORTABLE CHEST 1 VIEW COMPARISON:  09/30/2021 FINDINGS: The lungs are clear without focal pneumonia, edema, pneumothorax or pleural effusion. The cardiopericardial silhouette is within normal limits for size. No acute bony abnormality. Loop recorder device evident. Telemetry leads overlie the chest. IMPRESSION: No  active disease. Electronically Signed   By: Camellia Candle M.D.   On: 06/19/2024 08:45   EKG: Independently reviewed.  Sinus rhythm at 89 bpm.  Nonspecific T wave changes.  Assessment/Plan Active Problems:   Hyperlipidemia with target LDL less than 70   Essential hypertension   Stroke (HCC)   Chest pain > Woke up at 6 AM with 10/10 pressure like chest pain.  Pain was nonradiating and not affected by movement.  Had some associated weakness when she tried to stand.   > Does have a loop recorder in place as a part of eval for stroke in January. > Chest x-ray showed no acute normality.  Troponin negative x 2 at 10, 12.  Pain improved. > Risk factors include hypertension, hyperlipidemia.  Does have previous stroke as above.  Is on Plavix , rosuvastatin , Zetia. > Cardiology consulted and recommend outpatient workup - Appreciate cardiology recommendations and assistance - Further testing per cardiology out patient - Continue Plavix , rosuvastatin , Zetia  Hypertension - Continue home amlodipine , hydrochlorothiazide   Hyperlipidemia - Continue rosuvastatin , Zetia  History of CVA - Continue home Plavix , rosuvastatin , Zetia  History of anemia > Hemoglobin currently normal - Trend CBC   Marsa KATHEE Scurry MD [facility]  We appreciate this Consultation

## 2024-06-20 ENCOUNTER — Telehealth: Payer: Self-pay | Admitting: Cardiology

## 2024-06-20 NOTE — Telephone Encounter (Signed)
 Started yesterday while in the hospital, but pt did not think much about it -- thought she slept wrong.  But then when she was reading her AVS from the hospital and it talked about chest pain and related back pain.  She is calling cardiologist for advisement b/c of what she read. Advised ok to take Tylenol  PRN for her back pain, and if continues/tylenol  doesn't work she should follow up with her PCP. Patient verbalized understanding and agreeable to plan.

## 2024-06-20 NOTE — Telephone Encounter (Signed)
 Pt would like to know what medication she should take for back pain.

## 2024-06-25 ENCOUNTER — Encounter

## 2024-06-25 ENCOUNTER — Ambulatory Visit (INDEPENDENT_AMBULATORY_CARE_PROVIDER_SITE_OTHER)

## 2024-06-25 DIAGNOSIS — I639 Cerebral infarction, unspecified: Secondary | ICD-10-CM

## 2024-06-25 LAB — CUP PACEART REMOTE DEVICE CHECK
Date Time Interrogation Session: 20251005233956
Implantable Pulse Generator Implant Date: 20250114

## 2024-06-26 ENCOUNTER — Ambulatory Visit: Admitting: Nurse Practitioner

## 2024-06-26 ENCOUNTER — Telehealth (HOSPITAL_COMMUNITY): Payer: Self-pay | Admitting: Emergency Medicine

## 2024-06-26 NOTE — Progress Notes (Signed)
 Remote Loop Recorder Transmission

## 2024-06-26 NOTE — Telephone Encounter (Signed)
 Reaching out to patient to offer assistance regarding upcoming cardiac imaging study; pt verbalizes understanding of appt date/time, parking situation and where to check in, pre-test NPO status and medications ordered, and verified current allergies; name and call back number provided for further questions should they arise Rockwell Alexandria RN Navigator Cardiac Imaging [facility] [phone] office [phone] cell

## 2024-06-27 ENCOUNTER — Ambulatory Visit (HOSPITAL_COMMUNITY)
Admission: RE | Admit: 2024-06-27 | Discharge: 2024-06-27 | Disposition: A | Discharge status: Home / Self Care | Source: Ambulatory Visit | Attending: Cardiology | Admitting: Cardiology

## 2024-06-27 ENCOUNTER — Ambulatory Visit: Payer: Self-pay | Admitting: Cardiology

## 2024-06-27 DIAGNOSIS — R079 Chest pain, unspecified: Secondary | ICD-10-CM | POA: Diagnosis not present

## 2024-06-27 DIAGNOSIS — R072 Precordial pain: Secondary | ICD-10-CM | POA: Diagnosis not present

## 2024-06-27 MED ORDER — NITROGLYCERIN 0.4 MG SL SUBL
0.8000 mg | SUBLINGUAL_TABLET | Freq: Once | SUBLINGUAL | Status: AC
  Administered 2024-06-27: 0.8 mg via SUBLINGUAL

## 2024-06-27 MED ORDER — IOHEXOL 350 MG/ML SOLN
100.0000 mL | Freq: Once | INTRAVENOUS | Status: AC | PRN
  Administered 2024-06-27: 100 mL via INTRAVENOUS

## 2024-06-28 ENCOUNTER — Ambulatory Visit: Payer: Self-pay | Admitting: Cardiology

## 2024-06-28 ENCOUNTER — Other Ambulatory Visit (HOSPITAL_COMMUNITY): Payer: Self-pay

## 2024-06-29 ENCOUNTER — Telehealth: Payer: Self-pay | Admitting: Cardiology

## 2024-06-29 NOTE — Telephone Encounter (Signed)
 [facility] calling to report critical stats

## 2024-06-29 NOTE — Telephone Encounter (Signed)
 EXAM: OVER-READ INTERPRETATION  CT CHEST   The following report is an over-read performed by radiologist Dr. Elspeth Dada [facility] on 06/29/2024. This over-read does not include interpretation of cardiac or coronary anatomy or pathology. The cardiovascular interpretation by the cardiologist is attached.   COMPARISON:  Chest radiographs dated 06/19/2024 and 09/30/2021   FINDINGS: Mediastinum: Unremarkable.   Lungs and pleura: 5.5 mm mean diameter right lower lobe pulmonary nodule on image number [id number]. 4 mm right middle lobe subpleural nodule on image number [id number].   Upper abdomen: Unremarkable.   Musculoskeletal: Minimal thoracic spine degenerative changes.   IMPRESSION: 5.5 mm right lower lobe and 4 mm right middle lobe pulmonary nodules. Non-contrast chest CT at 3-6 months is recommended. If the nodules are stable at time of repeat CT, then future CT at 18-24 months (from today's scan) is considered optional for low-risk patients, but is recommended for high-risk patients. This recommendation follows the consensus statement: Guidelines for Management of Incidental Pulmonary Nodules Detected on CT Images: From the Fleischner Society 2017; Radiology 2017; 284:228-243.  Will send to the provider.

## 2024-07-02 NOTE — Telephone Encounter (Signed)
 Follow-up radiology read on her CT scan showed 2 small pulmonary nodules, and the recommendation is a noncontrasted CT scan to be checked within 3 to 6 months.  I think we can probably talk about this when she sees Damien back next week and the test can be ordered at that time.  After that, I think it is probably best for the PCP to follow-up with it.   Alm Clay, MD
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
## 2024-07-09 ENCOUNTER — Ambulatory Visit: Attending: Nurse Practitioner | Admitting: Emergency Medicine

## 2024-07-09 ENCOUNTER — Encounter: Payer: Self-pay | Admitting: Nurse Practitioner

## 2024-07-09 VITALS — BP 130/60 | HR 93 | Ht 62.0 in | Wt 127.0 lb

## 2024-07-09 DIAGNOSIS — I10 Essential (primary) hypertension: Secondary | ICD-10-CM | POA: Insufficient documentation

## 2024-07-09 DIAGNOSIS — R911 Solitary pulmonary nodule: Secondary | ICD-10-CM | POA: Insufficient documentation

## 2024-07-09 DIAGNOSIS — I2510 Atherosclerotic heart disease of native coronary artery without angina pectoris: Secondary | ICD-10-CM | POA: Diagnosis not present

## 2024-07-09 DIAGNOSIS — E785 Hyperlipidemia, unspecified: Secondary | ICD-10-CM | POA: Insufficient documentation

## 2024-07-09 NOTE — Assessment & Plan Note (Addendum)
 Lipid panel 10/03/2023: Cholesterol 163, HDL 73, LDL 83, triglycerides 33 Patient did eat yogurt for breakfast this morning, will order fasting lipid to be drawn at some point over the next week Patient reports adherence with her rosuvastatin  and Zetia, states insurance has not returned their answer on whether they will cover the Repatha. If LDL not at goal, patient states she would prefer to escalate statin therapy prior to the addition of repatha, would likely increase crestor  to 40 mg daily, then refer to PharmD for prior authorization for repatha if not at goal in future. Continue rosuvastatin  20 mg daily Continue Zetia 10 mg daily

## 2024-07-09 NOTE — Patient Instructions (Addendum)
 Medication Your physician recommends that you continue on your current medications as directed. Please refer to the Current Medication list given to you today.  *If you need a refill on your cardiac medications before your next appointment, please call your pharmacy*  Lab Work: Fasting Lipid panel at your convenience   Testing/Procedures: CT Chest    Follow-Up: At [facility], you and your health needs are our priority.  As part of our continuing mission to provide you with exceptional heart care, our providers are all part of one team.  This team includes your primary Cardiologist (physician) and Advanced Practice Providers or APPs (Physician Assistants and Nurse Practitioners) who all work together to provide you with the care you need, when you need it.  Your next appointment:   4-6 month(s) (appointment must be after pts scheduled CT)  Provider:   Alm Clay, MD or Sherman Shams NP          We recommend signing up for the patient portal called MyChart.  Sign up information is provided on this After Visit Summary.  MyChart is used to connect with patients for Virtual Visits (Telemedicine).  Patients are able to view lab/test results, encounter notes, upcoming appointments, etc.  Non-urgent messages can be sent to your provider as well.   To learn more about what you can do with MyChart, go to [email or web address].   Other Instructions

## 2024-07-10 ENCOUNTER — Encounter: Payer: Self-pay | Admitting: Adult Health

## 2024-07-10 ENCOUNTER — Ambulatory Visit (INDEPENDENT_AMBULATORY_CARE_PROVIDER_SITE_OTHER): Admitting: Adult Health

## 2024-07-10 VITALS — BP 139/75 | HR 90 | Ht 62.0 in | Wt 127.0 lb

## 2024-07-10 DIAGNOSIS — I639 Cerebral infarction, unspecified: Secondary | ICD-10-CM

## 2024-07-10 NOTE — Patient Instructions (Signed)
 Continue clopidogrel  75 mg daily, Crestor  and Zetia for secondary stroke prevention  Cardiology will continue to monitor loop recorder and you will be called with any concerning findings  Continue to follow up with PCP regarding blood pressure and cholesterol management  Maintain strict control of hypertension with blood pressure goal below 130/90 and cholesterol with LDL cholesterol (bad cholesterol) goal below 70 mg/dL.   Signs of a Stroke? Follow the BEFAST method:  Balance Watch for a sudden loss of balance, trouble with coordination or vertigo Eyes Is there a sudden loss of vision in one or both eyes? Or double vision?  Face: Ask the person to smile. Does one side of the face droop or is it numb?  Arms: Ask the person to raise both arms. Does one arm drift downward? Is there weakness or numbness of a leg? Speech: Ask the person to repeat a simple phrase. Does the speech sound slurred/strange? Is the person confused ? Time: If you observe any of these signs, call 911.        Thank you for coming to see us  at [facility]. I hope we have been able to provide you high quality care today.  You may receive a patient satisfaction survey over the next few weeks. We would appreciate your feedback and comments so that we may continue to improve ourselves and the health of our patients.

## 2024-07-10 NOTE — Progress Notes (Signed)
 [facility] [address] [place]. [place] [address] [phone]       OFFICE FOLLOW-UP NOTE  Kristy [name] Date of Birth:  Aug 20, 1946 Medical Record Number:  [id number]    Primary neurologist: Dr. Rosemarie Reason for visit: Stroke follow-up  Chief Complaint  Patient presents with   Cerebrovascular Accident    Rm 8 alone  Pt is well and stable, reports no new concerns.      HPI:   Update 07/10/2024 JM: Patient returns for 76-month stroke follow-up visit unaccompanied.  Overall stable without new stroke/TIA symptoms. Denies any specific residual deficits but doesn't feel like she is completely back to baseline but has a difficult time further describing. She denies any residual left sided deficits and returned back to all prior activities. She continues to drive without difficulty, lives alone and maintains ADLs and IADLs independently.  She will carry a cane when ambulating to use just in case but typically does not need, denies any recent falls.  remains on Plavix , Crestor  and Zetia without side effects.  Routinely follows with PCP and cardiology.  Was seen by cardiology yesterday, she plans on completing lipid panel this week when fasting. If remains elevated, will likely proceed with Repatha.  Loop recorder has not shown atrial fibrillation thus far.  No further questions or concerns at this time.    Initial visit 12/21/2023 Dr. Rosemarie: Kristy [name] is a pleasant 78 year old African-American lady seen today for initial office follow-up visit following hospital admission for stroke in January 2025.  History is obtained from the patient and review of electronic medical records.  I personally reviewed pertinent available imaging films in PACS. She has past medical history of allergy, anemia, hyperlipidemia and hypertension.  She presented on 10/02/2023 for evaluation for sudden onset of left arm and leg heaviness and weakness.  EMS called code stroke.  On exam she had  an NIH stroke scale of 3 with left facial arm and leg weakness.  After discussion of risk benefits and alternatives IV TNK was administered uneventfully.  She is admitted to the ICU.  Blood pressure was tightly controlled.  MRI scan subsequently showed patchy infarct involving posterior aspect of right putamen, right caudate and adjacent white matter with some petechial hemorrhage and also involvement of the right posterior posterior central gyrus.  There were changes of chronic small vessel disease as well.  2D echo showed ejection fraction of 60 to 65%.  Normal left atrial size.  Hemoglobin A1c was 5.5 and LDL cholesterol 83 mg percent.  CT angiogram showed area of 67 mm segment of moderate right M1 irregularity and stenosis with calcific hide atherosclerosis of both ICA siphons.  Patient was started on dual antiplatelet therapy aspirin  Plavix  for 3 weeks followed by aspirin  alone.  She was discharged home with outpatient physical therapy needs.  Patient states she has done well since discharge.  She is just finished with therapy and discharge.  She has regained almost full strength on the left side.  She does get a little stiff from time to time but doing exercises seem to help.  She states she is not tolerating aspirin  well and does get dyspepsia and stomach upset and would like to stop it.  Her blood pressure is under good control.  Her primary care physician increase the dose of Crestor  to 40 mg daily which she is tolerating well without side effects and she has plan to get follow-up lab work checked tomorrow.  She has no new  complaints and denies any new recurrent stroke or TIA symptoms.  She had a loop recorder inserted and so far no paroxysmal A-fib has yet been found.,    ROS:   14 system review of systems is positive for those listed in HPI and all other systems negative  PMH:  Past Medical History:  Diagnosis Date   Allergy    Anemia    Hyperlipidemia    Hypertension    Stroke (HCC)  10/02/2023    Social History:  Social History   Socioeconomic History   Marital status: Divorced    Spouse name: n/a   Number of children: 2   Years of education: Not on file   Highest education level: Bachelor's degree (e.g., BA, AB, BS)  Occupational History   Occupation: retired Field seismologist [employer]  Tobacco Use   Smoking status: Never   Smokeless tobacco: Never  Vaping Use   Vaping status: Never Used  Substance and Sexual Activity   Alcohol use: No    Alcohol/week: 0.0 standard drinks of alcohol   Drug use: No   Sexual activity: Never  Other Topics Concern   Not on file  Social History Narrative   Her son lives with her.   Social Drivers of Corporate investment banker Strain: Low Risk  (08/05/2017)   Overall Financial Resource Strain (CARDIA)    Difficulty of Paying Living Expenses: Not hard at all  Food Insecurity: No Food Insecurity (10/17/2023)   Hunger Vital Sign    Worried About Running Out of Food in the Last Year: Never true    Ran Out of Food in the Last Year: Never true  Transportation Needs: No Transportation Needs (10/17/2023)   PRAPARE - Administrator, Civil Service (Medical): No    Lack of Transportation (Non-Medical): No  Physical Activity: Inactive (08/05/2017)   Exercise Vital Sign    Days of Exercise per Week: 0 days    Minutes of Exercise per Session: 0 min  Stress: No Stress Concern Present (08/05/2017)   [employer] of Occupational Health - Occupational Stress Questionnaire    Feeling of Stress : Not at all  Social Connections: Moderately Isolated (10/02/2023)   Social Connection and Isolation Panel    Frequency of Communication with Friends and Family: More than three times a week    Frequency of Social Gatherings with Friends and Family: More than three times a week    Attends Religious Services: Never    Database administrator or Organizations: Yes    Attends Engineer, structural: More than 4 times per  year    Marital Status: Divorced  Intimate Partner Violence: Not At Risk (10/17/2023)   Humiliation, Afraid, Rape, and Kick questionnaire    Fear of Current or Ex-Partner: No    Emotionally Abused: No    Physically Abused: No    Sexually Abused: No    Medications:   Current Outpatient Medications on File Prior to Visit  Medication Sig Dispense Refill   acetaminophen  (TYLENOL ) 325 MG tablet Take 650 mg by mouth every 6 (six) hours as needed for moderate pain (pain score 4-6).     amLODipine  (NORVASC ) 10 MG tablet Take 10 mg by mouth daily.     cetirizine (ZYRTEC) 10 MG tablet Take 10 mg by mouth daily.     clopidogrel  (PLAVIX ) 75 MG tablet Take 1 tablet (75 mg total) by mouth daily. 90 tablet 3   ezetimibe (ZETIA) 10 MG  tablet Take 10 mg by mouth daily.     famotidine  (PEPCID ) 20 MG tablet Take 1 tablet (20 mg total) by mouth 2 (two) times daily. 180 tablet 1   hydrochlorothiazide  (MICROZIDE ) 12.5 MG capsule Take 1 capsule (12.5 mg total) by mouth daily. 90 capsule 1   meclizine  (ANTIVERT ) 12.5 MG tablet Take 1 tablet (12.5 mg total) by mouth 3 (three) times daily as needed for dizziness. 30 tablet 0   rosuvastatin  (CRESTOR ) 20 MG tablet Take 20 mg by mouth daily.     Evolocumab (REPATHA SURECLICK) 140 MG/ML SOAJ Inject 140 mg into the skin every 14 (fourteen) days. (Patient not taking: Reported on 07/10/2024)     No current facility-administered medications on file prior to visit.    Allergies:  No Known Allergies  Physical Exam Today's Vitals   07/10/24 0838  BP: 139/75  Pulse: 90  Weight: 127 lb (57.6 kg)  Height: 5' 2 (1.575 m)   Body mass index is 23.23 kg/m.   General: well developed, well nourished very pleasant elderly African-American lady, seated, in no evident distress  Neurologic Exam Mental Status: Awake and fully alert.  Fluent speech and language.  Oriented to place and time. Recent and remote memory intact. Attention span, concentration and fund of knowledge  appropriate. Mood and affect appropriate.  Cranial Nerves: Pupils equal, briskly reactive to light. Extraocular movements full without nystagmus. Visual fields full to confrontation. Hearing intact. Facial sensation intact. Face, tongue, palate moves normally and symmetrically.  Motor: Normal bulk and tone. Normal strength in all tested extremity muscles. Mildly orbits right over left upper extremity. Sensory.: intact to touch ,pinprick .position and vibratory sensation.  Coordination: Rapid alternating movements normal in all extremities. Finger-to-nose and heel-to-shin performed accurately bilaterally. Gait and Station: Arises from chair without difficulty. Stance is normal. Gait demonstrates normal stride length and balance without use of AD Reflexes: 1+ and symmetric. Toes downgoing.        ASSESSMENT: 78 year old pleasant African-American lady with right large basal ganglia infarct in January 2025 treated with thrombolysis with IV TNK of cryptogenic etiology s/p ILR.  She is doing very well with near complete recovery.  Vascular risk factors of hypertension and hyperlipidemia only.     PLAN:  - Continue Plavix  75 mg daily, Crestor  20 mg daily, and Zetia for secondary stroke prevention managed/prescribed by PCP/cardiology - Continue close PCP and cardiology follow-up for aggressive stroke risk factor management, plans on completing repeat lipid panel this week, currently not fasting - Loop recorder continued to be monitored by cardiology without evidence of A-fib thus far   No further recommendations from stroke standpoint and is closely being followed by PCP and cardiology.  She can follow-up here on an as-needed basis and advised to call with any questions or concerns in the future     I personally spent a total of 25 minutes in the care of the patient today including preparing to see the patient, performing a medically appropriate exam/evaluation, counseling and educating, and  documenting clinical information in the EHR.  Harlene Bogaert, AGNP-BC  [facility] [address] [place], [place] [address]  Phone [phone] Fax [phone] Note: This document was prepared with digital dictation and possible smart phrase technology. Any transcriptional errors that result from this process are unintentional.

## 2024-07-12 DIAGNOSIS — E785 Hyperlipidemia, unspecified: Secondary | ICD-10-CM | POA: Diagnosis not present

## 2024-07-13 LAB — LIPID PANEL
Chol/HDL Ratio: 2.1 ratio (ref 0.0–4.4)
Cholesterol, Total: 181 mg/dL (ref 100–199)
HDL: 85 mg/dL (ref >39)
LDL Chol Calc (NIH): 84 mg/dL (ref 0–99)
Triglycerides: 63 mg/dL (ref 0–149)
VLDL Cholesterol Cal: 12 mg/dL (ref 5–40)

## 2024-07-14 ENCOUNTER — Ambulatory Visit: Payer: Self-pay | Admitting: Emergency Medicine

## 2024-07-14 DIAGNOSIS — E785 Hyperlipidemia, unspecified: Secondary | ICD-10-CM

## 2024-07-16 ENCOUNTER — Telehealth: Payer: Self-pay | Admitting: Cardiology

## 2024-07-16 ENCOUNTER — Other Ambulatory Visit: Payer: Self-pay

## 2024-07-16 DIAGNOSIS — E785 Hyperlipidemia, unspecified: Secondary | ICD-10-CM

## 2024-07-16 MED ORDER — ROSUVASTATIN CALCIUM 40 MG PO TABS
40.0000 mg | ORAL_TABLET | Freq: Every day | ORAL | 1 refills | Status: AC
Start: 2024-07-16 — End: ?

## 2024-07-16 NOTE — Telephone Encounter (Signed)
 Patient identification verified by 2 forms.  Informed pt no other charges have been made to her medications. She is to continue the Zetia daily.   Patient agrees with plan, no questions at this time

## 2024-07-16 NOTE — Telephone Encounter (Signed)
 Pt called in requesting advice on medication with the increase of Crestor  from 20mg  to 40mg  should she still take Zetia please advise

## 2024-07-26 ENCOUNTER — Encounter

## 2024-07-26 ENCOUNTER — Ambulatory Visit

## 2024-07-26 DIAGNOSIS — I639 Cerebral infarction, unspecified: Secondary | ICD-10-CM

## 2024-07-26 LAB — CUP PACEART REMOTE DEVICE CHECK
Date Time Interrogation Session: 20251105235613
Implantable Pulse Generator Implant Date: 20250114

## 2024-07-30 ENCOUNTER — Ambulatory Visit: Payer: Self-pay | Admitting: Cardiology

## 2024-07-31 NOTE — Progress Notes (Signed)
 Remote Loop Recorder Transmission

## 2024-08-13 DIAGNOSIS — Z0000 Encounter for general adult medical examination without abnormal findings: Secondary | ICD-10-CM | POA: Diagnosis not present

## 2024-08-13 DIAGNOSIS — Z79899 Other long term (current) drug therapy: Secondary | ICD-10-CM | POA: Diagnosis not present

## 2024-08-13 DIAGNOSIS — E559 Vitamin D deficiency, unspecified: Secondary | ICD-10-CM | POA: Diagnosis not present

## 2024-08-13 DIAGNOSIS — I10 Essential (primary) hypertension: Secondary | ICD-10-CM | POA: Diagnosis not present

## 2024-08-13 DIAGNOSIS — Z8673 Personal history of transient ischemic attack (TIA), and cerebral infarction without residual deficits: Secondary | ICD-10-CM | POA: Diagnosis not present

## 2024-08-13 DIAGNOSIS — E7800 Pure hypercholesterolemia, unspecified: Secondary | ICD-10-CM | POA: Diagnosis not present

## 2024-08-13 DIAGNOSIS — K219 Gastro-esophageal reflux disease without esophagitis: Secondary | ICD-10-CM | POA: Diagnosis not present

## 2024-08-13 DIAGNOSIS — M8589 Other specified disorders of bone density and structure, multiple sites: Secondary | ICD-10-CM | POA: Diagnosis not present

## 2024-08-14 ENCOUNTER — Telehealth: Payer: Self-pay | Admitting: Cardiology

## 2024-08-14 NOTE — Telephone Encounter (Signed)
*  STAT* If patient is at the pharmacy, call can be transferred to refill team.   1. Which medications need to be refilled? (please list name of each medication and dose if known)   rosuvastatin  (CRESTOR ) 40 MG tablet   2. Would you like to learn more about the convenience, safety, & potential cost savings by using the [facility]?   3. Are you open to using the [facility] (Type [facility]. ).  4. Which pharmacy/location (including street and city if local pharmacy) is medication to be sent to?  [id number] - [place], [place] - [address]   5. Do they need a 30 day or 90 day supply?   90 day  Patient stated she is almost out of this medication.

## 2024-08-15 NOTE — Telephone Encounter (Signed)
 Placed call to pt to let her know when the Rosuvastatin  was increased to 40 mg, a new prescription was [facility] to [facility].  She thanked me for letting her know and she will contact them.

## 2024-08-26 ENCOUNTER — Ambulatory Visit

## 2024-08-26 DIAGNOSIS — I639 Cerebral infarction, unspecified: Secondary | ICD-10-CM | POA: Diagnosis not present

## 2024-08-27 ENCOUNTER — Encounter

## 2024-08-28 LAB — CUP PACEART REMOTE DEVICE CHECK
Date Time Interrogation Session: 20251206232743
Implantable Pulse Generator Implant Date: 20250114

## 2024-08-29 ENCOUNTER — Ambulatory Visit: Payer: Self-pay | Admitting: Cardiology

## 2024-09-04 NOTE — Progress Notes (Signed)
 Remote Loop Recorder Transmission

## 2024-09-17 LAB — HEPATIC FUNCTION PANEL
ALT: 51 IU/L — ABNORMAL HIGH (ref 0–32)
AST: 55 IU/L — ABNORMAL HIGH (ref 0–40)
Albumin: 4.6 g/dL (ref 3.8–4.8)
Alkaline Phosphatase: 81 IU/L (ref 49–135)
Bilirubin Total: 0.8 mg/dL (ref 0.0–1.2)
Bilirubin, Direct: 0.24 mg/dL (ref 0.00–0.40)
Total Protein: 7.5 g/dL (ref 6.0–8.5)

## 2024-09-17 LAB — LIPID PANEL
Chol/HDL Ratio: 2.2 ratio (ref 0.0–4.4)
Cholesterol, Total: 160 mg/dL (ref 100–199)
HDL: 73 mg/dL
LDL Chol Calc (NIH): 72 mg/dL (ref 0–99)
Triglycerides: 79 mg/dL (ref 0–149)
VLDL Cholesterol Cal: 15 mg/dL (ref 5–40)

## 2024-09-18 ENCOUNTER — Ambulatory Visit: Payer: Self-pay | Admitting: Emergency Medicine

## 2024-09-18 DIAGNOSIS — E785 Hyperlipidemia, unspecified: Secondary | ICD-10-CM

## 2024-09-21 ENCOUNTER — Telehealth: Payer: Self-pay | Admitting: Cardiology

## 2024-09-21 NOTE — Telephone Encounter (Signed)
 Called patient and made her aware that she is okay to have a mammogram.

## 2024-09-21 NOTE — Telephone Encounter (Signed)
 Patient is having a mammogram soon and would like to know if this will interfere with ILR. Please advise.

## 2024-09-26 ENCOUNTER — Ambulatory Visit

## 2024-09-26 DIAGNOSIS — I639 Cerebral infarction, unspecified: Secondary | ICD-10-CM | POA: Diagnosis not present

## 2024-09-27 ENCOUNTER — Ambulatory Visit: Payer: Self-pay | Admitting: Cardiology

## 2024-09-27 ENCOUNTER — Encounter

## 2024-09-27 LAB — CUP PACEART REMOTE DEVICE CHECK
Date Time Interrogation Session: 20260106233136
Implantable Pulse Generator Implant Date: 20250114

## 2024-10-01 NOTE — Progress Notes (Signed)
 Remote Loop Recorder Transmission

## 2024-10-27 ENCOUNTER — Encounter

## 2024-10-29 ENCOUNTER — Encounter

## 2024-11-09 ENCOUNTER — Ambulatory Visit (HOSPITAL_COMMUNITY)

## 2024-11-27 ENCOUNTER — Encounter

## 2024-11-29 ENCOUNTER — Encounter
